# Patient Record
Sex: Female | Born: 1955
Health system: Southern US, Community
[De-identification: ages and names within clinical notes are randomized; demographics above are authoritative.]

## PROBLEM LIST (undated history)

## (undated) DIAGNOSIS — Z86010 Personal history of colonic polyps: Secondary | ICD-10-CM

## (undated) DIAGNOSIS — I10 Essential (primary) hypertension: Secondary | ICD-10-CM

## (undated) DIAGNOSIS — E538 Deficiency of other specified B group vitamins: Secondary | ICD-10-CM

## (undated) DIAGNOSIS — M545 Low back pain, unspecified: Secondary | ICD-10-CM

## (undated) DIAGNOSIS — M1990 Unspecified osteoarthritis, unspecified site: Secondary | ICD-10-CM

## (undated) DIAGNOSIS — I2510 Atherosclerotic heart disease of native coronary artery without angina pectoris: Secondary | ICD-10-CM

## (undated) DIAGNOSIS — D649 Anemia, unspecified: Secondary | ICD-10-CM

## (undated) DIAGNOSIS — H269 Unspecified cataract: Secondary | ICD-10-CM

## (undated) DIAGNOSIS — T7840XA Allergy, unspecified, initial encounter: Secondary | ICD-10-CM

## (undated) DIAGNOSIS — E785 Hyperlipidemia, unspecified: Secondary | ICD-10-CM

## (undated) HISTORY — DX: Anemia, unspecified: D64.9

## (undated) HISTORY — DX: Atherosclerotic heart disease of native coronary artery without angina pectoris: I25.10

## (undated) HISTORY — DX: Unspecified cataract: H26.9

## (undated) HISTORY — DX: Personal history of colonic polyps: Z86.010

## (undated) HISTORY — DX: Essential (primary) hypertension: I10

## (undated) HISTORY — PX: TUBAL LIGATION: SHX77

## (undated) HISTORY — PX: LASIK: SHX215

## (undated) HISTORY — DX: Unspecified osteoarthritis, unspecified site: M19.90

## (undated) HISTORY — PX: OTHER SURGICAL HISTORY: SHX169

## (undated) HISTORY — DX: Hyperlipidemia, unspecified: E78.5

## (undated) HISTORY — DX: Low back pain, unspecified: M54.50

## (undated) HISTORY — PX: POLYPECTOMY: SHX149

## (undated) HISTORY — DX: Low back pain: M54.5

## (undated) HISTORY — PX: BREAST LUMPECTOMY: SHX2

## (undated) HISTORY — DX: Allergy, unspecified, initial encounter: T78.40XA

## (undated) HISTORY — PX: EYE SURGERY: SHX253

## (undated) HISTORY — PX: COLONOSCOPY: SHX174

---
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
## 1997-10-18 ENCOUNTER — Ambulatory Visit (HOSPITAL_COMMUNITY): Admission: RE | Admit: 1997-10-18 | Discharge: 1997-10-18 | Payer: Self-pay | Admitting: *Deleted

## 1998-04-14 HISTORY — PX: OTHER SURGICAL HISTORY: SHX169

## 1998-06-09 ENCOUNTER — Encounter: Payer: Self-pay | Admitting: Emergency Medicine

## 1998-06-09 ENCOUNTER — Inpatient Hospital Stay (HOSPITAL_COMMUNITY): Admission: EM | Admit: 1998-06-09 | Discharge: 1998-06-14 | Payer: Self-pay | Admitting: Emergency Medicine

## 1998-06-10 ENCOUNTER — Encounter: Payer: Self-pay | Admitting: Internal Medicine

## 1998-11-06 ENCOUNTER — Other Ambulatory Visit: Admission: RE | Admit: 1998-11-06 | Discharge: 1998-11-06 | Payer: Self-pay | Admitting: Internal Medicine

## 1999-04-15 HISTORY — PX: OTHER SURGICAL HISTORY: SHX169

## 1999-10-07 ENCOUNTER — Other Ambulatory Visit: Admission: RE | Admit: 1999-10-07 | Discharge: 1999-10-07 | Payer: Self-pay | Admitting: Gynecology

## 1999-10-21 ENCOUNTER — Other Ambulatory Visit: Admission: RE | Admit: 1999-10-21 | Discharge: 1999-10-21 | Payer: Self-pay | Admitting: Gynecology

## 1999-12-02 ENCOUNTER — Encounter (INDEPENDENT_AMBULATORY_CARE_PROVIDER_SITE_OTHER): Payer: Self-pay | Admitting: Specialist

## 1999-12-02 ENCOUNTER — Other Ambulatory Visit: Admission: RE | Admit: 1999-12-02 | Discharge: 1999-12-02 | Payer: Self-pay | Admitting: Gynecology

## 2000-10-16 ENCOUNTER — Other Ambulatory Visit: Admission: RE | Admit: 2000-10-16 | Discharge: 2000-10-16 | Payer: Self-pay | Admitting: Obstetrics and Gynecology

## 2004-02-29 ENCOUNTER — Ambulatory Visit: Payer: Self-pay | Admitting: Internal Medicine

## 2004-03-06 ENCOUNTER — Ambulatory Visit: Payer: Self-pay | Admitting: Internal Medicine

## 2004-05-06 ENCOUNTER — Ambulatory Visit: Payer: Self-pay | Admitting: Internal Medicine

## 2004-07-04 ENCOUNTER — Ambulatory Visit: Payer: Self-pay | Admitting: Internal Medicine

## 2004-09-06 ENCOUNTER — Ambulatory Visit: Payer: Self-pay | Admitting: Internal Medicine

## 2004-09-16 ENCOUNTER — Ambulatory Visit: Payer: Self-pay | Admitting: Internal Medicine

## 2004-12-17 ENCOUNTER — Ambulatory Visit: Payer: Self-pay | Admitting: Internal Medicine

## 2005-03-24 ENCOUNTER — Ambulatory Visit: Payer: Self-pay | Admitting: Internal Medicine

## 2005-06-24 ENCOUNTER — Ambulatory Visit: Payer: Self-pay | Admitting: Internal Medicine

## 2005-08-25 ENCOUNTER — Ambulatory Visit: Payer: Self-pay | Admitting: Internal Medicine

## 2005-09-25 ENCOUNTER — Other Ambulatory Visit: Admission: RE | Admit: 2005-09-25 | Discharge: 2005-09-25 | Payer: Self-pay | Admitting: Gynecology

## 2005-10-27 ENCOUNTER — Ambulatory Visit: Payer: Self-pay | Admitting: Internal Medicine

## 2005-11-12 ENCOUNTER — Ambulatory Visit: Payer: Self-pay | Admitting: Internal Medicine

## 2005-12-18 ENCOUNTER — Encounter: Payer: Self-pay | Admitting: Internal Medicine

## 2005-12-18 ENCOUNTER — Encounter (INDEPENDENT_AMBULATORY_CARE_PROVIDER_SITE_OTHER): Payer: Self-pay | Admitting: *Deleted

## 2005-12-18 ENCOUNTER — Ambulatory Visit: Payer: Self-pay | Admitting: Internal Medicine

## 2005-12-18 DIAGNOSIS — Z860100 Personal history of colon polyps, unspecified: Secondary | ICD-10-CM | POA: Insufficient documentation

## 2005-12-25 LAB — HM COLONOSCOPY

## 2006-01-20 ENCOUNTER — Ambulatory Visit: Payer: Self-pay | Admitting: Internal Medicine

## 2006-01-20 LAB — CONVERTED CEMR LAB
ALT: 16 units/L (ref 0–40)
AST: 21 units/L (ref 0–37)
Albumin: 3.4 g/dL — ABNORMAL LOW (ref 3.5–5.2)
Alkaline Phosphatase: 54 units/L (ref 39–117)
Bilirubin, Direct: 0.1 mg/dL (ref 0.0–0.3)
Chol/HDL Ratio, serum: 5.5
Cholesterol: 174 mg/dL (ref 0–200)
HDL: 31.7 mg/dL — ABNORMAL LOW (ref >39.0)
LDL Cholesterol: 120 mg/dL — ABNORMAL HIGH (ref 0–99)
Total Bilirubin: 0.4 mg/dL (ref 0.3–1.2)
Total Protein: 6.1 g/dL (ref 6.0–8.3)
Triglyceride fasting, serum: 111 mg/dL (ref 0–149)
VLDL: 22 mg/dL (ref 0–40)

## 2006-01-27 ENCOUNTER — Ambulatory Visit: Payer: Self-pay | Admitting: Internal Medicine

## 2006-03-31 ENCOUNTER — Ambulatory Visit: Payer: Self-pay | Admitting: Internal Medicine

## 2006-05-27 ENCOUNTER — Ambulatory Visit: Payer: Self-pay | Admitting: Internal Medicine

## 2006-07-13 ENCOUNTER — Ambulatory Visit: Payer: Self-pay | Admitting: Internal Medicine

## 2006-09-18 ENCOUNTER — Ambulatory Visit: Payer: Self-pay | Admitting: Internal Medicine

## 2006-09-18 LAB — CONVERTED CEMR LAB: Hgb A1c MFr Bld: 6.8 % — ABNORMAL HIGH (ref 4.6–6.0)

## 2006-11-09 DIAGNOSIS — I2510 Atherosclerotic heart disease of native coronary artery without angina pectoris: Secondary | ICD-10-CM | POA: Insufficient documentation

## 2006-11-19 ENCOUNTER — Ambulatory Visit: Payer: Self-pay | Admitting: Internal Medicine

## 2006-11-19 DIAGNOSIS — I152 Hypertension secondary to endocrine disorders: Secondary | ICD-10-CM | POA: Insufficient documentation

## 2006-11-19 DIAGNOSIS — M545 Low back pain, unspecified: Secondary | ICD-10-CM | POA: Insufficient documentation

## 2006-11-19 DIAGNOSIS — E1165 Type 2 diabetes mellitus with hyperglycemia: Secondary | ICD-10-CM

## 2006-11-19 DIAGNOSIS — I10 Essential (primary) hypertension: Secondary | ICD-10-CM | POA: Insufficient documentation

## 2006-11-19 DIAGNOSIS — E785 Hyperlipidemia, unspecified: Secondary | ICD-10-CM

## 2007-01-26 ENCOUNTER — Telehealth: Payer: Self-pay | Admitting: Internal Medicine

## 2007-02-24 ENCOUNTER — Telehealth: Payer: Self-pay | Admitting: *Deleted

## 2007-02-26 ENCOUNTER — Ambulatory Visit: Payer: Self-pay | Admitting: Internal Medicine

## 2007-02-26 LAB — CONVERTED CEMR LAB: Hgb A1c MFr Bld: 6.9 % — ABNORMAL HIGH (ref 4.6–6.0)

## 2007-04-02 ENCOUNTER — Telehealth: Payer: Self-pay | Admitting: Internal Medicine

## 2007-05-03 ENCOUNTER — Telehealth: Payer: Self-pay | Admitting: Internal Medicine

## 2007-05-07 ENCOUNTER — Ambulatory Visit: Payer: Self-pay | Admitting: Internal Medicine

## 2007-05-07 DIAGNOSIS — M1990 Unspecified osteoarthritis, unspecified site: Secondary | ICD-10-CM | POA: Insufficient documentation

## 2007-06-03 ENCOUNTER — Encounter: Payer: Self-pay | Admitting: Internal Medicine

## 2007-07-07 ENCOUNTER — Encounter: Payer: Self-pay | Admitting: Internal Medicine

## 2007-07-13 ENCOUNTER — Ambulatory Visit (HOSPITAL_COMMUNITY): Admission: RE | Admit: 2007-07-13 | Discharge: 2007-07-14 | Payer: Self-pay | Admitting: Orthopedic Surgery

## 2007-07-13 ENCOUNTER — Encounter: Payer: Self-pay | Admitting: Internal Medicine

## 2007-07-20 ENCOUNTER — Encounter: Payer: Self-pay | Admitting: Internal Medicine

## 2007-08-24 ENCOUNTER — Telehealth: Payer: Self-pay | Admitting: Internal Medicine

## 2007-09-27 ENCOUNTER — Ambulatory Visit: Payer: Self-pay | Admitting: Internal Medicine

## 2007-09-27 LAB — CONVERTED CEMR LAB
ALT: 20 units/L (ref 0–35)
AST: 22 units/L (ref 0–37)
Alkaline Phosphatase: 57 units/L (ref 39–117)
Total Bilirubin: 0.6 mg/dL (ref 0.3–1.2)
Total CHOL/HDL Ratio: 5.1
Triglycerides: 163 mg/dL — ABNORMAL HIGH (ref 0–149)

## 2007-10-04 ENCOUNTER — Ambulatory Visit: Payer: Self-pay | Admitting: Internal Medicine

## 2007-11-29 ENCOUNTER — Ambulatory Visit: Payer: Self-pay | Admitting: Internal Medicine

## 2007-12-06 ENCOUNTER — Ambulatory Visit: Payer: Self-pay | Admitting: Internal Medicine

## 2007-12-29 ENCOUNTER — Telehealth: Payer: Self-pay | Admitting: Internal Medicine

## 2008-01-06 ENCOUNTER — Telehealth: Payer: Self-pay | Admitting: Internal Medicine

## 2008-04-17 ENCOUNTER — Ambulatory Visit: Payer: Self-pay | Admitting: Internal Medicine

## 2008-04-24 ENCOUNTER — Ambulatory Visit: Payer: Self-pay | Admitting: Internal Medicine

## 2008-06-27 ENCOUNTER — Telehealth: Payer: Self-pay | Admitting: Internal Medicine

## 2008-07-24 ENCOUNTER — Ambulatory Visit: Payer: Self-pay | Admitting: Internal Medicine

## 2008-08-14 ENCOUNTER — Ambulatory Visit: Payer: Self-pay | Admitting: Internal Medicine

## 2008-08-14 DIAGNOSIS — B029 Zoster without complications: Secondary | ICD-10-CM | POA: Insufficient documentation

## 2008-08-23 ENCOUNTER — Telehealth (INDEPENDENT_AMBULATORY_CARE_PROVIDER_SITE_OTHER): Payer: Self-pay | Admitting: *Deleted

## 2008-09-19 ENCOUNTER — Ambulatory Visit: Payer: Self-pay | Admitting: Internal Medicine

## 2008-09-19 DIAGNOSIS — L255 Unspecified contact dermatitis due to plants, except food: Secondary | ICD-10-CM | POA: Insufficient documentation

## 2008-09-19 LAB — CONVERTED CEMR LAB
Hgb A1c MFr Bld: 6.9 % — ABNORMAL HIGH (ref 4.6–6.5)
Microalb Creat Ratio: 3.8 mg/g (ref 0.0–30.0)
Microalb, Ur: 0.7 mg/dL (ref 0.0–1.9)

## 2008-12-12 ENCOUNTER — Encounter: Payer: Self-pay | Admitting: Internal Medicine

## 2008-12-12 ENCOUNTER — Encounter (INDEPENDENT_AMBULATORY_CARE_PROVIDER_SITE_OTHER): Payer: Self-pay | Admitting: *Deleted

## 2008-12-13 LAB — HM DIABETES EYE EXAM

## 2009-01-05 ENCOUNTER — Ambulatory Visit: Payer: Self-pay | Admitting: Internal Medicine

## 2009-04-11 ENCOUNTER — Ambulatory Visit: Payer: Self-pay | Admitting: Internal Medicine

## 2009-04-11 LAB — CONVERTED CEMR LAB
ALT: 21 units/L (ref 0–35)
Calcium: 9.3 mg/dL (ref 8.4–10.5)
Cholesterol: 162 mg/dL (ref 0–200)
Creatinine, Ser: 1 mg/dL (ref 0.4–1.2)
Eosinophils Relative: 4.7 % (ref 0.0–5.0)
GFR calc non Af Amer: 61.01 mL/min (ref >60)
Glucose, Urine, Semiquant: NEGATIVE
HCT: 37.8 % (ref 36.0–46.0)
Hgb A1c MFr Bld: 7.2 % — ABNORMAL HIGH (ref 4.6–6.5)
Lymphs Abs: 1.5 10*3/uL (ref 0.7–4.0)
MCV: 98.1 fL (ref 78.0–100.0)
Microalb Creat Ratio: 4.5 mg/g (ref 0.0–30.0)
Monocytes Absolute: 0.3 10*3/uL (ref 0.1–1.0)
Platelets: 218 10*3/uL (ref 150.0–400.0)
RDW: 13.1 % (ref 11.5–14.6)
Specific Gravity, Urine: 1.025
TSH: 2.88 microintl units/mL (ref 0.35–5.50)
Total Bilirubin: 0.7 mg/dL (ref 0.3–1.2)
Triglycerides: 211 mg/dL — ABNORMAL HIGH (ref 0.0–149.0)
WBC Urine, dipstick: NEGATIVE
WBC: 6.6 10*3/uL (ref 4.5–10.5)
pH: 5

## 2009-04-27 ENCOUNTER — Ambulatory Visit: Payer: Self-pay | Admitting: Internal Medicine

## 2009-04-27 DIAGNOSIS — L218 Other seborrheic dermatitis: Secondary | ICD-10-CM | POA: Insufficient documentation

## 2009-07-30 ENCOUNTER — Ambulatory Visit: Payer: Self-pay | Admitting: Internal Medicine

## 2009-08-08 ENCOUNTER — Ambulatory Visit: Payer: Self-pay | Admitting: Internal Medicine

## 2009-08-19 ENCOUNTER — Observation Stay (HOSPITAL_COMMUNITY): Admission: EM | Admit: 2009-08-19 | Discharge: 2009-08-20 | Payer: Self-pay | Admitting: Emergency Medicine

## 2009-08-20 ENCOUNTER — Ambulatory Visit: Payer: Self-pay | Admitting: Vascular Surgery

## 2009-08-24 ENCOUNTER — Encounter: Payer: Self-pay | Admitting: Internal Medicine

## 2009-09-12 ENCOUNTER — Telehealth (INDEPENDENT_AMBULATORY_CARE_PROVIDER_SITE_OTHER): Payer: Self-pay | Admitting: *Deleted

## 2009-09-12 ENCOUNTER — Ambulatory Visit: Payer: Self-pay | Admitting: Women's Health

## 2009-09-12 ENCOUNTER — Other Ambulatory Visit: Admission: RE | Admit: 2009-09-12 | Discharge: 2009-09-12 | Payer: Self-pay | Admitting: Gynecology

## 2009-09-14 ENCOUNTER — Ambulatory Visit: Payer: Self-pay | Admitting: Vascular Surgery

## 2009-09-14 ENCOUNTER — Encounter: Payer: Self-pay | Admitting: Internal Medicine

## 2009-11-14 ENCOUNTER — Ambulatory Visit: Payer: Self-pay | Admitting: Internal Medicine

## 2009-11-14 LAB — CONVERTED CEMR LAB: Hgb A1c MFr Bld: 7.4 % — ABNORMAL HIGH (ref 4.6–6.5)

## 2009-11-28 ENCOUNTER — Ambulatory Visit: Payer: Self-pay | Admitting: Women's Health

## 2009-12-11 ENCOUNTER — Ambulatory Visit: Payer: Self-pay | Admitting: Internal Medicine

## 2010-02-27 ENCOUNTER — Ambulatory Visit: Payer: Self-pay | Admitting: Internal Medicine

## 2010-03-01 ENCOUNTER — Telehealth: Payer: Self-pay | Admitting: *Deleted

## 2010-03-01 ENCOUNTER — Ambulatory Visit: Payer: Self-pay | Admitting: Family Medicine

## 2010-03-01 DIAGNOSIS — R42 Dizziness and giddiness: Secondary | ICD-10-CM | POA: Insufficient documentation

## 2010-03-01 LAB — CONVERTED CEMR LAB
ALT: 13 units/L (ref 0–35)
AST: 17 units/L (ref 0–37)
Alkaline Phosphatase: 53 units/L (ref 39–117)
Bilirubin, Direct: 0.1 mg/dL (ref 0.0–0.3)
CO2: 26 meq/L (ref 19–32)
Chloride: 102 meq/L (ref 96–112)
Sodium: 139 meq/L (ref 135–145)
Total Protein: 7.1 g/dL (ref 6.0–8.3)

## 2010-03-06 ENCOUNTER — Ambulatory Visit: Payer: Self-pay | Admitting: Internal Medicine

## 2010-03-26 ENCOUNTER — Ambulatory Visit: Payer: Self-pay | Admitting: Vascular Surgery

## 2010-05-14 NOTE — Progress Notes (Signed)
  Phone Note Other Incoming   Summary of Call: Please call her and let her know her labs were all good, but her HbA1c (long term diabetes test) was a little higher than last time (was 7.4%, now is 7.7%).  I'll let her and Dr. Lovell Sheehan discuss a plan for this at her scheduled f/u visit with him next week. Initial call taken by: Michell Heinrich M.D.,  March 01, 2010 4:56 PM  Follow-up for Phone Call        Pt aware of results. Pt is going to see Dr. Lovell Sheehan on 11/23. Follow-up by: Romualdo Bolk, CMA (AAMA),  March 04, 2010 10:21 AM

## 2010-05-14 NOTE — Progress Notes (Signed)
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    Initial call taken by: Debbra Riding,  September 12, 2009 2:25 PM  Follow-up for Phone Call        may refill Follow-up by: Stacie Glaze MD,  September 13, 2009 8:08 AM    Prescriptions: GLUCOVANCE 5-500 MG  TABS [facility]) three times a day  #90 x 6   Entered by:   Willy Eddy, LPN   Authorized by:   Stacie Glaze MD   Signed by:   Willy Eddy, LPN on 75/64/3329   Method used:   [facility]        [facility] [address] [phone]* (retail)       [address]       [place], [place]  [address]       Ph: [phone] or [phone]       Fax: [phone]   RxID:   [id number]   Appended Document: REFILL REQUEST (Metformin) left message on machine to pharmacy to disregard -sent to wrong pharmacy

## 2010-05-14 NOTE — Assessment & Plan Note (Signed)
Summary: dizziness/njr   Vital Signs:  Patient profile:   55 year old female Weight:      167 pounds O2 Sat:      98 % on Room air Temp:     97.8 degrees F oral Pulse rate:   74 / minute BP sitting:   110 / 60  (left arm) Cuff size:   regular  Vitals Entered By: Romualdo Bolk, CMA (AAMA) (March 01, 2010 1:25 PM)  O2 Flow:  Room air CC: Dizziness and some nausea when she bends over and gets back up. 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   CC:  Dizziness and some nausea when she bends over and gets back up. 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 Describes onset about 2-3 days ago of light headed feeling that occurs only with position changes of her head and upper body, lasts only a few seconds.  Denies vertigo sensation--just says she feels lightheaded.  Denies ringing in ears or hearing problems.  Had some mild nausea at onset of problem but none since.  Has had mild nasal congestion last few days, without coughing or fevers.  Glucoses have been unchanged from her normal lately--says they are never far off of normal on daily or every other day checks.   ROS: No headaches, no GI or GU symptoms, no rash, no muscle aches or joint aches.   Preventive Screening-Counseling & Management  Alcohol-Tobacco     Smoking Status: quit     Smoke Cessation Stage: contemplative     Year Quit: 2010     Pack years: 20     Tobacco Counseling: to quit use of tobacco products  Caffeine-Diet-Exercise     Diet Comments: obese     Does Patient Exercise: no     Depression Counseling: no aplicable  Current Problems (verified): 1)  Dizziness  (ICD-780.4) 2)  Other Seborrheic Dermatitis  (ICD-690.18) 3)  Preventive Health Care  (ICD-V70.0) 4)  Shingles  (ICD-053.9) 5)  Contact Dermatitis Due To Poison Ivy  (ICD-692.6) 6)  Monilial Vaginitis  (ICD-112.1) 7)  Viral Exanthem, Acute   (ICD-057.9) 8)  Uns Advrs Eff Uns Rx Medicinal&biological Sbstnc  (ICD-995.20) 9)  Osteoarthritis  (ICD-715.90) 10)  Family History of Cad Female 1st Degree Relative <60  (ICD-V16.49) 11)  Family History of Cad Female 1st Degree Relative <50  (ICD-V17.3) 12)  Low Back Pain  (ICD-724.2) 13)  Hyperlipidemia  (ICD-272.4) 14)  Hypertension  (ICD-401.9) 15)  Diabetes Mellitus, Type II  (ICD-250.00) 16)  Coronary Artery Disease  (ICD-414.00) 17)  Anemia-nos  (ICD-285.9)  Current Medications (verified): 1)  Baby Aspirin 81 Mg  Chew (Aspirin) .... Once Daily 2)  Glucovance 5-500 Mg  Tabs (Glyburide-Metformin) .... Three Times A Day 3)  Lopressor 50 Mg  Tabs (Metoprolol Tartrate) .... 1/2 Twice A Day 4)  Crestor 10 Mg  Tabs (Rosuvastatin Calcium) .... Once Daily 5)  Allegra-D 24 Hour 180-240 Mg  Tb24 (Fexofenadine-Pseudoephedrine) .... As Needed 6)  Osteo Complex   Caps (Multiple Vitamins-Minerals) .... Once Daily 7)  Benicar Hct 20-12.5 Mg  Tabs (Olmesartan Medoxomil-Hctz) .... Once Daily 8)  Onglyza 5 Mg Tabs (Saxagliptin Hcl) .... One By Mouth Daily 9)  Sarna Sensitive 1 % Lotn (Pramoxine Hcl) 10)  Accu-Chek Compact Test Drum  Strp (Glucose Blood) .[name].. 1 Three Times A Day  Allergies (verified):  No Known Drug Allergies  Past History:  Past medical, surgical, family and social histories (including risk factors) reviewed for relevance to current acute and chronic problems.  Past Medical History: Reviewed history from 05/07/2007 and no changes required. Diabetes mellitus, type II Hypertension Low back pain Hyperlipidemia Coronary artery disease Osteoarthritis  Past Surgical History: Reviewed history from 04/24/2008 and no changes required. Caesarean section knee surgery for torn cartilige Carpal tunnel release both hands Lumpectomy stents 2001 Rotator cuff surgery triger finger repair  Family History: Reviewed history from 11/19/2006 and no changes required. father Family  History of CAD Female 1st degree relative <50 mother Family History of CAD Female 1st degree relative <60 Family History Hypertension  Social History: Reviewed history from 11/19/2006 and no changes required. Married Former Smoker Alcohol use-no Drug use-no Regular exercise-no  Review of Systems       see HPI  Physical Exam  General:  VS: noted, all normal. Gen: Alert, well appearing, oriented x 4. HEENT: Scalp without lesions or hair loss.  Ears: EACs clear, normal epithelium.  TMs with good light reflex and landmarks bilaterally.  Eyes: no injection, icteris, swelling, or exudate.  EOMI, PERRLA. Nose: no drainage or turbinate edema/swelling.  No inection or focal lesion.  Mouth: lips without lesion/swelling.  Oral mucosa pink and moist.  Dentition intact and without obvious caries or gingival swelling.  Oropharynx without erythema, exudate, or swelling.  Neck: supple.  No lymphadenopathy, thyromegaly, or mass. Chest: symmetric expansion, with nonlabored respirations.  Clear and equal breath sounds in all lung fields.   CV: RRR, no m/r/g.  Peripheral pulses 2+/symmetric. Neuro: CN 2-12 intact bilaterally, strength 5/5 in proximal and distal upper extremities and lower extremities bilaterally.  No sensory deficits.  No tremor.  No disdiadokinesis.  No ataxia.  Upper extremity and lower extremity DTRs symmetric.  No pronator drift. Dix-Halpike maneuvers do induce 3-4 seconds of lightheaded feeling but no nystagmus and no vertigo or nausea.   Impression & Recommendations:  Problem # 1:  DIZZINESS (ICD-780.4) She likely has atypical BPPV.   I reviewed her w/u during a recent hospitalization for severe vertigo a few months ago. I gave a handout of modified Epley maneuvers for BPPV and went over these with her---she'll try these several times per day over the next week.  She has a routine f/u visit already scheduled with Dr. Lovell Sheehan next week.  Orders: TLB-BMP (Basic Metabolic  Panel-BMET) (80048-METABOL) TLB-Hepatic/Liver Function Pnl (80076-HEPATIC) TLB-A1C / Hgb A1C (Glycohemoglobin) (83036-A1C)  Problem # 2:  HYPERTENSION (ICD-401.9) Stable.  Her updated medication list for this problem includes:    Lopressor 50 Mg Tabs (Metoprolol tartrate) .[name]... 1/2 twice a day    Benicar Hct 20-12.5 Mg Tabs (Olmesartan medoxomil-hctz) ..... Once daily  Orders: TLB-BMP (Basic Metabolic Panel-BMET) (80048-METABOL) TLB-Hepatic/Liver Function Pnl (80076-HEPATIC) TLB-A1C / Hgb A1C (Glycohemoglobin) (83036-A1C)  Problem # 3:  DIABETES MELLITUS, TYPE II (ICD-250.00) Stable.  Labs today.  Her updated medication list for this problem includes:    Baby Aspirin 81 Mg Chew (Aspirin) ..... Once daily    Glucovance 5-500 Mg Tabs (Glyburide-metformin) .[name]... Three times a day    Benicar Hct 20-12.5 Mg Tabs (Olmesartan medoxomil-hctz) ..... Once daily    Onglyza 5 Mg Tabs (Saxagliptin hcl) ..... One by mouth daily  Orders: TLB-BMP (Basic Metabolic Panel-BMET) (80048-METABOL) TLB-Hepatic/Liver Function Pnl (80076-HEPATIC) TLB-A1C / Hgb A1C (Glycohemoglobin) (83036-A1C)  Complete Medication List: 1)  Baby Aspirin 81 Mg Chew (Aspirin) .... Once daily 2)  Glucovance 5-500  Mg Tabs (Glyburide-metformin) .... Three times a day 3)  Lopressor 50 Mg Tabs (Metoprolol tartrate) .... 1/2 twice a day 4)  Crestor 10 Mg Tabs (Rosuvastatin calcium) .... Once daily 5)  Allegra-d 24 Hour 180-240 Mg Tb24 (Fexofenadine-pseudoephedrine) .... As needed 6)  Osteo Complex Caps (Multiple vitamins-minerals) .... Once daily 7)  Benicar Hct 20-12.5 Mg Tabs (Olmesartan medoxomil-hctz) .... Once daily 8)  Onglyza 5 Mg Tabs (Saxagliptin hcl) .... One by mouth daily 9)  Sarna Sensitive 1 % Lotn (Pramoxine hcl) 10)  Accu-chek Compact Test Drum Strp (Glucose blood) .[name].. 1 three times a day  Patient Instructions: 1)  Keep your follow up appointment next week with Dr. Lovell Sheehan. 2)  Do the positioning exercises  on your handout.   Orders Added: 1)  Est. Patient Level IV [[address]] 2)  TLB-BMP (Basic Metabolic Panel-BMET) [80048-METABOL] 3)  TLB-Hepatic/Liver Function Pnl [80076-HEPATIC] 4)  TLB-A1C / Hgb A1C (Glycohemoglobin) [83036-A1C]  Appended Document: dizziness/njr    Clinical Lists Changes  Observations: Added new observation of PULSE STAND: 72 /min (03/01/2010 15:15) Added new observation of BP DIA STAND: 80 mm Hg (03/01/2010 15:15) Added new observation of BP SYS STAND: 124 mm Hg (03/01/2010 15:15) Added new observation of PULSE SIT: 66 /min (03/01/2010 15:15) Added new observation of BP DIA SIT: 80 mm Hg (03/01/2010 15:15) Added new observation of BP SYS SIT: 134 mm Hg (03/01/2010 15:15) Added new observation of PULSE SUP R: 72 /min (03/01/2010 15:15) Added new observation of BP DIA SUP R: 80  (03/01/2010 15:15) Added new observation of BP SYS SUP R: 130  (03/01/2010 15:15)        Vital Signs:  Patient profile:   55 year old female Pulse (ortho):   72 / minute BP standing:   124 / 80   Serial Vital Signs/Assessments:  Time      Position  BP       Pulse  Resp  Temp     By           Lying RA  130/80   72                    Shannon S Cranford, CMA (AAMA)           Sitting   134/80   66                    Romualdo Bolk, CMA (AAMA)           Standing  124/80   72                    Shannon S Cranford, CMA (AAMA)

## 2010-05-14 NOTE — Assessment & Plan Note (Signed)
Summary: 3 month rov/njr   Vital Signs:  Patient profile:   55 year old female Height:      63 inches Weight:      167 pounds BMI:     29.69 Temp:     98.2 degrees F oral Pulse rate:   72 / minute Resp:     14 per minute BP sitting:   134 / 70  (left arm)  Vitals Entered By: Willy Eddy, LPN (March 06, 2010 9:56 AM) CC: roa labs, Hypertension Management Is Patient Diabetic? Yes Did you bring your meter with you today? No   Primary Care Provider:  Stacie Glaze MD  CC:  roa labs and Hypertension Management.  History of Present Illness: was seen last friday for recurrent labrynthitis, has tied the eply manuvers and the dizzyness has resolved woried about hypoglycemia still smoking and we discussed the risks of smoking with her today  The pts A1C is 7.7 with a goal of less that seven exercize is lackng and diet consistant she works third shift... which is difficult bloodpresure is stable  Hypertension History:      She denies headache, chest pain, palpitations, dyspnea with exertion, orthopnea, PND, peripheral edema, visual symptoms, neurologic problems, syncope, and side effects from treatment.        Positive major cardiovascular risk factors include female age 3 years old or older, diabetes, hyperlipidemia, and hypertension.  Negative major cardiovascular risk factors include non-tobacco-user status.        Positive history for target organ damage include ASHD (either angina/prior MI/prior CABG).     Preventive Screening-Counseling & Management  Alcohol-Tobacco     Smoking Status: quit     Smoke Cessation Stage: contemplative     Year Quit: 2010     Pack years: 20     Tobacco Counseling: to quit use of tobacco products  Problems Prior to Update: 1)  Dizziness  (ICD-780.4) 2)  Other Seborrheic Dermatitis  (ICD-690.18) 3)  Preventive Health Care  (ICD-V70.0) 4)  Shingles  (ICD-053.9) 5)  Contact Dermatitis Due To Poison Ivy  (ICD-692.6) 6)  Monilial  Vaginitis  (ICD-112.1) 7)  Viral Exanthem, Acute  (ICD-057.9) 8)  Uns Advrs Eff Uns Rx Medicinal&biological Sbstnc  (ICD-995.20) 9)  Osteoarthritis  (ICD-715.90) 10)  Family History of Cad Female 1st Degree Relative <60  (ICD-V16.49) 11)  Family History of Cad Female 1st Degree Relative <50  (ICD-V17.3) 12)  Low Back Pain  (ICD-724.2) 13)  Hyperlipidemia  (ICD-272.4) 14)  Hypertension  (ICD-401.9) 15)  Diabetes Mellitus, Type II  (ICD-250.00) 16)  Coronary Artery Disease  (ICD-414.00) 17)  Anemia-nos  (ICD-285.9)  Current Problems (verified): 1)  Dizziness  (ICD-780.4) 2)  Other Seborrheic Dermatitis  (ICD-690.18) 3)  Preventive Health Care  (ICD-V70.0) 4)  Shingles  (ICD-053.9) 5)  Contact Dermatitis Due To Poison Ivy  (ICD-692.6) 6)  Monilial Vaginitis  (ICD-112.1) 7)  Viral Exanthem, Acute  (ICD-057.9) 8)  Uns Advrs Eff Uns Rx Medicinal&biological Sbstnc  (ICD-995.20) 9)  Osteoarthritis  (ICD-715.90) 10)  Family History of Cad Female 1st Degree Relative <60  (ICD-V16.49) 11)  Family History of Cad Female 1st Degree Relative <50  (ICD-V17.3) 12)  Low Back Pain  (ICD-724.2) 13)  Hyperlipidemia  (ICD-272.4) 14)  Hypertension  (ICD-401.9) 15)  Diabetes Mellitus, Type II  (ICD-250.00) 16)  Coronary Artery Disease  (ICD-414.00) 17)  Anemia-nos  (ICD-285.9)  Medications Prior to Update: 1)  Baby Aspirin 81 Mg  Chew (Aspirin) .... Once  Daily 2)  Glucovance 5-500 Mg  Tabs (Glyburide-Metformin) .... Three Times A Day 3)  Lopressor 50 Mg  Tabs (Metoprolol Tartrate) .... 1/2 Twice A Day 4)  Crestor 10 Mg  Tabs (Rosuvastatin Calcium) .... Once Daily 5)  Allegra-D 24 Hour 180-240 Mg  Tb24 (Fexofenadine-Pseudoephedrine) .... As Needed 6)  Osteo Complex   Caps (Multiple Vitamins-Minerals) .... Once Daily 7)  Benicar Hct 20-12.5 Mg  Tabs (Olmesartan Medoxomil-Hctz) .... Once Daily 8)  Onglyza 5 Mg Tabs (Saxagliptin Hcl) .... One By Mouth Daily 9)  Sarna Sensitive 1 % Lotn (Pramoxine Hcl) 10)   Accu-Chek Compact Test Drum  Strp (Glucose Blood) .[name].. 1 Three Times A Day  Current Medications (verified): 1)  Baby Aspirin 81 Mg  Chew (Aspirin) .... Once Daily 2)  Metformin Hcl 1000 Mg Tabs (Metformin Hcl) .... One By Mouth Two Times A Day 6 Pm and 4 Am On Work Days and 6 Pm and in The Am When You Awaken ( 7-8am) On Non Work Days 3)  Lopressor 50 Mg  Tabs (Metoprolol Tartrate) .... 1/2 Twice A Day 4)  Crestor 10 Mg  Tabs (Rosuvastatin Calcium) .... Once Daily 5)  Allegra-D 24 Hour 180-240 Mg  Tb24 (Fexofenadine-Pseudoephedrine) .... As Needed 6)  Osteo Complex   Caps (Multiple Vitamins-Minerals) .... Once Daily 7)  Benicar Hct 20-12.5 Mg  Tabs (Olmesartan Medoxomil-Hctz) .... Once Daily 8)  Onglyza 5 Mg Tabs (Saxagliptin Hcl) .... One By Mouth Daily 9)  Sarna Sensitive 1 % Lotn (Pramoxine Hcl) 10)  Accu-Chek Compact Test Drum  Strp (Glucose Blood) .[name].. 1 Three Times A Day 11)  Glimepiride 4 Mg Tabs (Glimepiride) .... One By Mouth At 6 Pm  Allergies (verified): No Known Drug Allergies  Past History:  Family History: Last updated: 11/19/2006 father Family History of CAD Female 1st degree relative <50 mother Family History of CAD Female 1st degree relative <60 Family History Hypertension  Social History: Last updated: 11/19/2006 Married Former Smoker Alcohol use-no Drug use-no Regular exercise-no  Risk Factors: Diet: obese (03/01/2010) Exercise: no (03/01/2010)  Risk Factors: Smoking Status: quit (03/06/2010)  Past medical, surgical, family and social histories (including risk factors) reviewed, and no changes noted (except as noted below).  Past Medical History: Reviewed history from 05/07/2007 and no changes required. Diabetes mellitus, type II Hypertension Low back pain Hyperlipidemia Coronary artery disease Osteoarthritis  Past Surgical History: Reviewed history from 04/24/2008 and no changes required. Caesarean section knee surgery for torn  cartilige Carpal tunnel release both hands Lumpectomy stents 2001 Rotator cuff surgery triger finger repair  Family History: Reviewed history from 11/19/2006 and no changes required. father Family History of CAD Female 1st degree relative <50 mother Family History of CAD Female 1st degree relative <60 Family History Hypertension  Social History: Reviewed history from 11/19/2006 and no changes required. Married Former Smoker Alcohol use-no Drug use-no Regular exercise-no  Review of Systems  The patient denies anorexia, fever, weight loss, weight gain, vision loss, decreased hearing, hoarseness, chest pain, syncope, dyspnea on exertion, peripheral edema, prolonged cough, headaches, hemoptysis, abdominal pain, melena, hematochezia, severe indigestion/heartburn, hematuria, incontinence, genital sores, muscle weakness, suspicious skin lesions, transient blindness, difficulty walking, depression, unusual weight change, abnormal bleeding, enlarged lymph nodes, angioedema, and breast masses.    Physical Exam  General:  VS: noted, all normal. Gen: Alert, well appearing, oriented x 4. HEENT: Scalp without lesions or hair loss.  Ears: EACs clear, normal epithelium.  TMs with good light reflex and landmarks bilaterally.  Eyes: no  injection, icteris, swelling, or exudate.  EOMI, PERRLA. Nose: no drainage or turbinate edema/swelling.  No inection or focal lesion.  Mouth: lips without lesion/swelling.  Oral mucosa pink and moist.  Dentition intact and without obvious caries or gingival swelling.  Oropharynx without erythema, exudate, or swelling.  Neck: supple.  No lymphadenopathy, thyromegaly, or mass. Chest: symmetric expansion, with nonlabored respirations.  Clear and equal breath sounds in all lung fields.   CV: RRR, no m/r/g.  Peripheral pulses 2+/symmetric. Neuro: CN 2-12 intact bilaterally, strength 5/5 in proximal and distal upper extremities and lower extremities bilaterally.  No sensory  deficits.  No tremor.  No disdiadokinesis.  No ataxia.  Upper extremity and lower extremity DTRs symmetric.  No pronator drift. Dix-Halpike maneuvers do induce 3-4 seconds of lightheaded feeling but no nystagmus and no vertigo or nausea. Head:  normocephalic and no abnormalities observed.   Eyes:  vision grossly intact, pupils equal, and pupils round.   Ears:  R ear normal and no external deformities.   Nose:  no external deformity.  no nasal discharge.   Neck:  supple and full ROM.   Lungs:  normal respiratory effort, no crackles, and no wheezes.   Heart:  normal rate, regular rhythm, and physiological split S2.   Abdomen:  Bowel sounds positive,abdomen soft and non-tender without masses, organomegaly or hernias noted.   Impression & Recommendations:  Problem # 1:  HYPERTENSION (ICD-401.9)  Her updated medication list for this problem includes:    Lopressor 50 Mg Tabs (Metoprolol tartrate) .[name]... 1/2 twice a day    Benicar Hct 20-12.5 Mg Tabs (Olmesartan medoxomil-hctz) ..... Once daily  BP today: 134/70 Prior BP: 124/80 (03/01/2010)  Prior 10 Yr Risk Heart Disease: N/A (05/07/2007)  Labs Reviewed: K+: 4.2 (03/01/2010) Creat: : 0.9 (03/01/2010)   Chol: 162 (04/11/2009)   HDL: 24.10 (04/11/2009)   LDL: 75 (09/27/2007)   TG: 211.0 (04/11/2009)  Problem # 2:  HYPERLIPIDEMIA (ICD-272.4) Assessment: Unchanged  Her updated medication list for this problem includes:    Crestor 10 Mg Tabs (Rosuvastatin calcium) ..... Once daily  Labs Reviewed: SGOT: 17 (03/01/2010)   SGPT: 13 (03/01/2010)  Prior 10 Yr Risk Heart Disease: N/A (05/07/2007)   HDL:24.10 (04/11/2009), 26.3 (09/27/2007)  LDL:75 (09/27/2007), 120 CALC (01/20/2006)  Chol:162 (04/11/2009), 134 (09/27/2007)  Trig:211.0 (04/11/2009), 163 (09/27/2007)  Problem # 3:  DIABETES MELLITUS, TYPE II (ICD-250.00)  Her updated medication list for this problem includes:    Baby Aspirin 81 Mg Chew (Aspirin) ..... Once daily     Metformin Hcl 1000 Mg Tabs (Metformin hcl) ..... One by mouth two times a day 6 pm and 4 am on work days and 6 pm and in the am when you awaken ( 7-8am) on non work days    Benicar Hct 20-12.5 Mg Tabs (Olmesartan medoxomil-hctz) ..... Once daily    Onglyza 5 Mg Tabs (Saxagliptin hcl) ..... One by mouth daily    Glimepiride 4 Mg Tabs (Glimepiride) ..... One by mouth at 6 pm  Labs Reviewed: Creat: 0.9 (03/01/2010)     Last Eye Exam: No diabetic retinopathy.    (12/13/2008) Reviewed HgBA1c results: 7.7 (03/01/2010)  7.4 (11/14/2009)  Problem # 4:  DIZZINESS (ICD-780.4) inner ear  Complete Medication List: 1)  Baby Aspirin 81 Mg Chew (Aspirin) .... Once daily 2)  Metformin Hcl 1000 Mg Tabs (Metformin hcl) .... One by mouth two times a day 6 pm and 4 am on work days and 6 pm and in the am when  you awaken ( 7-8am) on non work days 3)  Lopressor 50 Mg Tabs (Metoprolol tartrate) .... 1/2 twice a day 4)  Crestor 10 Mg Tabs (Rosuvastatin calcium) .... Once daily 5)  Allegra-d 24 Hour 180-240 Mg Tb24 (Fexofenadine-pseudoephedrine) .... As needed 6)  Osteo Complex Caps (Multiple vitamins-minerals) .... Once daily 7)  Benicar Hct 20-12.5 Mg Tabs (Olmesartan medoxomil-hctz) .... Once daily 8)  Onglyza 5 Mg Tabs (Saxagliptin hcl) .... One by mouth daily 9)  Sarna Sensitive 1 % Lotn (Pramoxine hcl) 10)  Accu-chek Compact Test Drum Strp (Glucose blood) .[name].. 1 three times a day 11)  Glimepiride 4 Mg Tabs (Glimepiride) .... One by mouth at 6 pm  Hypertension Assessment/Plan:      The patient's hypertensive risk group is category C: Target organ damage and/or diabetes.  Today's blood pressure is 134/70.  Her blood pressure goal is < 130/80.  Patient Instructions: 1)  Please schedule a follow-up appointment in 4 months. 2)  BMP prior to visit, ICD-9: 3)  HbgA1C prior to visit, ICD-9:250.82 4)  Lipid Panel prior to visit, ICD-9:272.4 5)  eight hours fast but can drink  water Prescriptions: GLIMEPIRIDE 4 MG TABS (GLIMEPIRIDE) one by mouth at 6 pm  #30 x 11   Entered and Authorized by:   Stacie Glaze MD   Signed by:   Stacie Glaze MD on 03/06/2010   Method used:   [facility] [phone]* (retail)       [address]       [place], [place]  [address]       Ph: [phone] or [phone]       Fax: [phone]   RxID:   [id number] METFORMIN HCL 1000 MG TABS (METFORMIN HCL) one by mouth two times a day 6 PM and 4 AM on work days and 6 PM and in the AM when you awaken ( 7-8AM) on non work days  #60 x 11   Entered and Authorized by:   Stacie Glaze MD   Signed by:   Stacie Glaze MD on 03/06/2010   Method used:   [facility]        [facility] [address] [phone]* (retail)       [address]       [place], [place]  [address]       Ph: [phone] or [phone]       Fax: [phone]   RxID:   [id number]    Orders Added: 1)  Est. Patient Level IV [42706]

## 2010-05-14 NOTE — Assessment & Plan Note (Signed)
Summary: cpx/mm rsc per pt/njr/daughter rescd from [facility]//ccm   Vital Signs:  Patient profile:   55 year old female Height:      63 inches Weight:      172 pounds BMI:     30.58 Temp:     98.2 degrees F oral Pulse rate:   72 / minute Resp:     14 per minute BP sitting:   130 / 80  (left arm)  Vitals Entered By: Willy Eddy, LPN (April 27, 2009 8:52 AM) CC: Hypertension Management   CC:  Hypertension Management.  History of Present Illness: The pt dose no thing that diet and additional sweets are the issue with the elevation in the A1C but the Trigylcerides are up as well with the low HDl consistant with metabolic syndrome  The pt presents today for CPX and thjs above was noted in the screening labs The pt was asked about all immunizations, health maint. services that are appropriate to their age and was given guidance on diet exercize  and weight management   Hypertension History:      She denies headache, chest pain, palpitations, dyspnea with exertion, orthopnea, PND, peripheral edema, visual symptoms, neurologic problems, syncope, and side effects from treatment.        Positive major cardiovascular risk factors include female age 80 years old or older, diabetes, hyperlipidemia, and hypertension.  Negative major cardiovascular risk factors include non-tobacco-user status.        Positive history for target organ damage include ASHD (either angina/prior MI/prior CABG).     Preventive Screening-Counseling & Management  Alcohol-Tobacco     Smoking Status: quit     Year Quit: 2010     Pack years: 20  Caffeine-Diet-Exercise     Diet Comments: obese     Does Patient Exercise: no     Depression Counseling: no aplicable  Problems Prior to Update: 1)  Preventive Health Care  (ICD-V70.0) 2)  Shingles  (ICD-053.9) 3)  Contact Dermatitis Due To Poison Ivy  (ICD-692.6) 4)  Monilial Vaginitis  (ICD-112.1) 5)  Viral Exanthem, Acute  (ICD-057.9) 6)  Uns Advrs Eff Uns Rx  Medicinal&biological Sbstnc  (ICD-995.20) 7)  Osteoarthritis  (ICD-715.90) 8)  Family History of Cad Female 1st Degree Relative <60  (ICD-V16.49) 9)  Family History of Cad Female 1st Degree Relative <50  (ICD-V17.3) 10)  Low Back Pain  (ICD-724.2) 11)  Hyperlipidemia  (ICD-272.4) 12)  Hypertension  (ICD-401.9) 13)  Diabetes Mellitus, Type II  (ICD-250.00) 14)  Coronary Artery Disease  (ICD-414.00) 15)  Anemia-nos  (ICD-285.9)  Medications Prior to Update: 1)  Baby Aspirin 81 Mg  Chew (Aspirin) .... Once Daily 2)  Glucovance 5-500 Mg  Tabs (Glyburide-Metformin) .... Three Times A Day 3)  Lopressor 50 Mg  Tabs (Metoprolol Tartrate) .... 1/2 Twice A Day 4)  Crestor 10 Mg  Tabs (Rosuvastatin Calcium) .... Once Daily 5)  Allegra-D 24 Hour 180-240 Mg  Tb24 (Fexofenadine-Pseudoephedrine) .... As Needed 6)  Osteo Complex   Caps (Multiple Vitamins-Minerals) .... Once Daily 7)  Benicar Hct 20-12.5 Mg  Tabs (Olmesartan Medoxomil-Hctz) .... Once Daily 8)  Januvia 100 Mg  Tabs (Sitagliptin Phosphate) .... One By Mouth Day 9)  Sarna Sensitive 1 % Lotn (Pramoxine Hcl) 10)  Xyzal 5 Mg Tabs (Levocetirizine Dihydrochloride) .... One Two Times A Day For 7 Days For Itching  Current Medications (verified): 1)  Baby Aspirin 81 Mg  Chew (Aspirin) .... Once Daily 2)  Glucovance 5-500 Mg  Tabs (  Glyburide-Metformin) .... Three Times A Day 3)  Lopressor 50 Mg  Tabs (Metoprolol Tartrate) .... 1/2 Twice A Day 4)  Crestor 10 Mg  Tabs (Rosuvastatin Calcium) .... Once Daily 5)  Allegra-D 24 Hour 180-240 Mg  Tb24 (Fexofenadine-Pseudoephedrine) .... As Needed 6)  Osteo Complex   Caps (Multiple Vitamins-Minerals) .... Once Daily 7)  Benicar Hct 20-12.5 Mg  Tabs (Olmesartan Medoxomil-Hctz) .... Once Daily 8)  Januvia 100 Mg  Tabs (Sitagliptin Phosphate) .... One By Mouth Day 9)  Sarna Sensitive 1 % Lotn (Pramoxine Hcl) 10)  Xyzal 5 Mg Tabs (Levocetirizine Dihydrochloride) .... One Two Times A Day For 7 Days For  Itching  Allergies (verified): No Known Drug Allergies  Past History:  Family History: Last updated: 11/19/2006 father Family History of CAD Female 1st degree relative <50 mother Family History of CAD Female 1st degree relative <60 Family History Hypertension  Social History: Last updated: 11/19/2006 Married Former Smoker Alcohol use-no Drug use-no Regular exercise-no  Risk Factors: Diet: obese (04/27/2009) Exercise: no (04/27/2009)  Risk Factors: Smoking Status: quit (04/27/2009)  Past medical, surgical, family and social histories (including risk factors) reviewed, and no changes noted (except as noted below).  Past Medical History: Reviewed history from 05/07/2007 and no changes required. Diabetes mellitus, type II Hypertension Low back pain Hyperlipidemia Coronary artery disease Osteoarthritis  Past Surgical History: Reviewed history from 04/24/2008 and no changes required. Caesarean section knee surgery for torn cartilige Carpal tunnel release both hands Lumpectomy stents 2001 Rotator cuff surgery triger finger repair  Family History: Reviewed history from 11/19/2006 and no changes required. father Family History of CAD Female 1st degree relative <50 mother Family History of CAD Female 1st degree relative <60 Family History Hypertension  Social History: Reviewed history from 11/19/2006 and no changes required. Married Former Smoker Alcohol use-no Drug use-no Regular exercise-no  Physical Exam  General:  alert.  well-developed.   Head:  normocephalic and no abnormalities observed.   Eyes:  vision grossly intact, pupils equal, and pupils round.   Ears:  R ear normal and no external deformities.   Nose:  no external deformity.  no nasal discharge.   Neck:  supple and full ROM.   Lungs:  normal respiratory effort, no crackles, and no wheezes.   Heart:  normal rate, regular rhythm, and physiological split S2.   Abdomen:  Bowel sounds  positive,abdomen soft and non-tender without masses, organomegaly or hernias noted. Msk:  normal ROM and no crepitation.   Extremities:  trace left pedal edema and trace right pedal edema.   Neurologic:  alert & oriented X3 and finger-to-nose normal.     Impression & Recommendations:  Problem # 1:  PREVENTIVE HEALTH CARE (ICD-V70.0)  The pt was asked about all immunizations, health maint. services that are appropriate to their age and was given guidance on diet exercize  and weight management [facility] Mammogram: normal (05/13/2008) due now Pap smear: normal (05/13/2008) Colonoscopy: Adenomatous Polyp (12/25/2005) Td Booster: Tdap (04/27/2009)   Flu Vax: Fluvax 3+ (01/05/2009)   Pneumovax: Pneumovax (04/24/2008) Chol: 162 (04/11/2009)   HDL: 24.10 (04/11/2009)   LDL: 75 (09/27/2007)   TG: 211.0 (04/11/2009) TSH: 2.88 (04/11/2009)   HgbA1C: 7.2 (04/11/2009)   Next mammogram due:: 05/2009 (04/27/2009) Next Colonoscopy due:: 12/2010 (04/27/2009)  Discussed using sunscreen, use of alcohol, drug use, self breast exam, routine dental care, routine eye care, schedule for GYN exam, routine physical exam, seat belts, multiple vitamins, osteoporosis prevention, adequate calcium intake in diet,  recommendations for immunizations, mammograms and Pap smears.  Discussed exercise and checking cholesterol.  Discussed gun safety, safe sex, and contraception.  Mammogram: normal (05/13/2008) Pap smear: normal (05/13/2008) Colonoscopy: Adenomatous Polyp (12/25/2005) Td Booster: Tdap (04/27/2009)   Flu Vax: Fluvax 3+ (01/05/2009)   Pneumovax: Pneumovax (04/24/2008) Chol: 162 (04/11/2009)   HDL: 24.10 (04/11/2009)   LDL: 75 (09/27/2007)   TG: 211.0 (04/11/2009) TSH: 2.88 (04/11/2009)   HgbA1C: 7.2 (04/11/2009)   Next mammogram due:: 05/2009 (04/27/2009) Next Colonoscopy due:: 12/2010 (04/27/2009)  Discussed using sunscreen, use of alcohol, drug use, self breast exam, routine dental care,  routine eye care, schedule for GYN exam, routine physical exam, seat belts, multiple vitamins, osteoporosis prevention, adequate calcium intake in diet, recommendations for immunizations, mammograms and Pap smears.  Discussed exercise and checking cholesterol.  Discussed gun safety, safe sex, and contraception.  Problem # 2:  DIABETES MELLITUS, TYPE II (ICD-250.00) Assessment: Deteriorated  the pts A1c has increased and we will add ongyza as a replacement or the [place] wiht monitering of diet and exercize Her updated medication list for this problem includes:    Baby Aspirin 81 Mg Chew (Aspirin) ..... Once daily    Glucovance 5-500 Mg Tabs (Glyburide-metformin) .[name]... Three times a day    Benicar Hct 20-12.5 Mg Tabs (Olmesartan medoxomil-hctz) ..... Once daily    Onglyza 5 Mg Tabs (Saxagliptin hcl) ..... One by mouth daily  Labs Reviewed: Creat: 1.0 (04/11/2009)     Last Eye Exam: No diabetic retinopathy.    (12/13/2008) Reviewed HgBA1c results: 7.2 (04/11/2009)  6.9 (09/19/2008)  Problem # 3:  OTHER SEBORRHEIC DERMATITIS (ICD-690.18) lotrisome 4 drops three times a day in affecteed ear  Complete Medication List: 1)  Baby Aspirin 81 Mg Chew (Aspirin) .... Once daily 2)  Glucovance 5-500 Mg Tabs (Glyburide-metformin) .... Three times a day 3)  Lopressor 50 Mg Tabs (Metoprolol tartrate) .... 1/2 twice a day 4)  Crestor 10 Mg Tabs (Rosuvastatin calcium) .... Once daily 5)  Allegra-d 24 Hour 180-240 Mg Tb24 (Fexofenadine-pseudoephedrine) .... As needed 6)  Osteo Complex Caps (Multiple vitamins-minerals) .... Once daily 7)  Benicar Hct 20-12.5 Mg Tabs (Olmesartan medoxomil-hctz) .... Once daily 8)  Onglyza 5 Mg Tabs (Saxagliptin hcl) .... One by mouth daily 9)  Sarna Sensitive 1 % Lotn (Pramoxine hcl) 10)  Xyzal 5 Mg Tabs (Levocetirizine dihydrochloride) .... One two times a day for 7 days for itching 11)  Clotrimazole-betamethasone 1-0.05 % Lotn (Clotrimazole-betamethasone) .... Two  to four drops in effected ear three times a day  Other Orders: EKG w/ Interpretation (93000) Tdap => 43yrs IM (69629) Admin 1st Vaccine (52841)  Hypertension Assessment/Plan:      The patient's hypertensive risk group is category C: Target organ damage and/or diabetes.  Today's blood pressure is 130/80.  Her blood pressure goal is < 130/80.  Patient Instructions: 1)  Please schedule a follow-up appointment in 3 months. 2)  HbgA1C prior to visit, ICD-9:250.00 Prescriptions: CLOTRIMAZOLE-BETAMETHASONE 1-0.05 % LOTN (CLOTRIMAZOLE-BETAMETHASONE) two to four drops in effected ear three times a day  #30cc x 0   Entered and Authorized by:   Stacie Glaze MD   Signed by:   Stacie Glaze MD on 04/27/2009   Method used:   Electronically to        [facility] [phone]* (retail)       [address]       [address]       [place], [place]  [address]  Ph: [phone]       Fax: [phone]   RxID:   [id number] ONGLYZA 5 MG TABS (SAXAGLIPTIN HCL) one by mouth daily  #30 x 11   Entered and Authorized by:   Stacie Glaze MD   Signed by:   Stacie Glaze MD on 04/27/2009   Method used:   Electronically to        [facility] [address] [phone]* (retail)       [address]       [address]       [place], [place]  [address]       Ph: [phone]       Fax: [phone]   RxID:   [id number]    Preventive Care Screening  Colonoscopy:    Date:  12/25/2005    Next Due:  12/2010    Results:  Adenomatous Polyp   Mammogram:    Date:  05/13/2008    Next Due:  05/2009    Results:  normal   Pap Smear:    Date:  05/13/2008    Next Due:  05/2009    Results:  normal      Immunizations Administered:  Tetanus Vaccine:    Vaccine Type: Tdap    Site: left deltoid    Mfr: Sanofi Pasteur    Dose: 0.5 ml    Route: IM    Given by: Willy Eddy, LPN    Exp. Date: 10/11/2010    Lot #: [id number]

## 2010-05-14 NOTE — Assessment & Plan Note (Signed)
Summary: 3 month rov/njr/pt rescd/cm---PT [facility] // RS   Vital Signs:  Patient profile:   55 year old female Height:      63 inches Weight:      166 pounds BMI:     29.51 Temp:     98.2 degrees F oral Pulse rate:   72 / minute Resp:     14 per minute BP sitting:   110 / 70  (left arm)  Vitals Entered By: Willy Eddy, LPN (December 11, 2009 3:02 PM) CC: roa, Hypertension Management Is Patient Diabetic? No   CC:  roa and Hypertension Management.  History of Present Illness: review of the [facility] report on the carotids with some preogression of the dz and still smoking!!!!!!! Had ani nfection in her gum that resulted in a syncopal episode No curretn chest pains, abnormal SOB follow up for the [facility]'s home AM readings 120 range but AiC7.4   Hypertension History:      She denies headache, chest pain, palpitations, dyspnea with exertion, orthopnea, PND, peripheral edema, visual symptoms, neurologic problems, syncope, and side effects from treatment.        Positive major cardiovascular risk factors include female age 25 years old or older, diabetes, hyperlipidemia, and hypertension.  Negative major cardiovascular risk factors include non-tobacco-user status.        Positive history for target organ damage include ASHD (either angina/prior MI/prior CABG).      Preventive Screening-Counseling & Management  Alcohol-Tobacco     Smoking Status: quit     Smoke Cessation Stage: contemplative     Year Quit: 2010     Pack years: 20     Tobacco Counseling: to quit use of tobacco products  Comments: high risk pt  Current Problems (verified): 1)  Other Seborrheic Dermatitis  (ICD-690.18) 2)  Preventive Health Care  (ICD-V70.0) 3)  Shingles  (ICD-053.9) 4)  Contact Dermatitis Due To Poison Ivy  (ICD-692.6) 5)  Monilial Vaginitis  (ICD-112.1) 6)  Viral Exanthem, Acute  (ICD-057.9) 7)  Uns Advrs Eff Uns Rx Medicinal&biological Sbstnc  (ICD-995.20) 8)  Osteoarthritis  (ICD-715.90) 9)   Family History of Cad Female 1st Degree Relative <60  (ICD-V16.49) 10)  Family History of Cad Female 1st Degree Relative <50  (ICD-V17.3) 11)  Low Back Pain  (ICD-724.2) 12)  Hyperlipidemia  (ICD-272.4) 13)  Hypertension  (ICD-401.9) 14)  Diabetes Mellitus, Type II  (ICD-250.00) 15)  Coronary Artery Disease  (ICD-414.00) 16)  Anemia-nos  (ICD-285.9)  Current Medications (verified): 1)  Baby Aspirin 81 Mg  Chew (Aspirin) .... Once Daily 2)  Glucovance 5-500 Mg  Tabs (Glyburide-Metformin) .... Three Times A Day 3)  Lopressor 50 Mg  Tabs (Metoprolol Tartrate) .... 1/2 Twice A Day 4)  Crestor 10 Mg  Tabs (Rosuvastatin Calcium) .... Once Daily 5)  Allegra-D 24 Hour 180-240 Mg  Tb24 (Fexofenadine-Pseudoephedrine) .... As Needed 6)  Osteo Complex   Caps (Multiple Vitamins-Minerals) .... Once Daily 7)  Benicar Hct 20-12.5 Mg  Tabs (Olmesartan Medoxomil-Hctz) .... Once Daily 8)  Onglyza 5 Mg Tabs (Saxagliptin Hcl) .... One By Mouth Daily 9)  Sarna Sensitive 1 % Lotn (Pramoxine Hcl) 10)  Clotrimazole-Betamethasone 1-0.05 % Lotn (Clotrimazole-Betamethasone) .... Two To Four Drops in Effected Ear Three Times A Day 11)  Accu-Chek Compact Test Drum  Strp (Glucose Blood) .[name].. 1 Three Times A Day  Allergies (verified): No Known Drug Allergies  Past History:  Family History: Last updated: 11/19/2006 father Family History of CAD Female 1st degree relative <  50 mother Family History of CAD Female 1st degree relative <60 Family History Hypertension  Social History: Last updated: 11/19/2006 Married Former Smoker Alcohol use-no Drug use-no Regular exercise-no  Risk Factors: Diet: obese (04/27/2009) Exercise: no (04/27/2009)  Risk Factors: Smoking Status: quit (12/11/2009)  Past medical, surgical, family and social histories (including risk factors) reviewed, and no changes noted (except as noted below).  Past Medical History: Reviewed history from 05/07/2007 and no changes  required. Diabetes mellitus, type II Hypertension Low back pain Hyperlipidemia Coronary artery disease Osteoarthritis  Past Surgical History: Reviewed history from 04/24/2008 and no changes required. Caesarean section knee surgery for torn cartilige Carpal tunnel release both hands Lumpectomy stents 2001 Rotator cuff surgery triger finger repair  Family History: Reviewed history from 11/19/2006 and no changes required. father Family History of CAD Female 1st degree relative <50 mother Family History of CAD Female 1st degree relative <60 Family History Hypertension  Social History: Reviewed history from 11/19/2006 and no changes required. Married Former Smoker Alcohol use-no Drug use-no Regular exercise-no  Review of Systems  The patient denies anorexia, fever, weight loss, weight gain, vision loss, decreased hearing, hoarseness, chest pain, syncope, dyspnea on exertion, peripheral edema, prolonged cough, headaches, hemoptysis, abdominal pain, melena, hematochezia, severe indigestion/heartburn, hematuria, incontinence, genital sores, muscle weakness, suspicious skin lesions, transient blindness, difficulty walking, depression, unusual weight change, abnormal bleeding, enlarged lymph nodes, angioedema, and breast masses.    Physical Exam  General:  alert.  well-developed.   Head:  normocephalic and no abnormalities observed.   Eyes:  vision grossly intact, pupils equal, and pupils round.   Ears:  R ear normal and no external deformities.   Nose:  no external deformity.  no nasal discharge.   Mouth:  pharynx pink and moist and no erythema.   Neck:  supple and full ROM.   Lungs:  normal respiratory effort, no crackles, and no wheezes.   Heart:  normal rate, regular rhythm, and physiological split S2.   Abdomen:  Bowel sounds positive,abdomen soft and non-tender without masses, organomegaly or hernias noted. Msk:  normal ROM and no joint tenderness.    Diabetes Management  Exam:    Foot Exam (with socks and/or shoes not present):       Sensory-Pinprick/Light touch:          Left medial foot (L-4): diminished          Left dorsal foot (L-5): diminished          Left lateral foot (S-1): diminished          Right medial foot (L-4): diminished          Right dorsal foot (L-5): diminished          Right lateral foot (S-1): diminished       Sensory-Monofilament:          Left foot: diminished          Right foot: diminished       Inspection:          Left foot: normal       Nails:          Left foot: normal          Right foot: normal    Impression & Recommendations:  Problem # 1:  HYPERTENSION (ICD-401.9)  Her updated medication list for this problem includes:    Lopressor 50 Mg Tabs (Metoprolol tartrate) .[name]... 1/2 twice a day    Benicar Hct 20-12.5 Mg Tabs (Olmesartan medoxomil-hctz) .Marland KitchenMarland KitchenMarland KitchenMarland Kitchen  Once daily  BP today: 110/70 Prior BP: 130/74 (08/08/2009)  Prior 10 Yr Risk Heart Disease: N/A (05/07/2007)  Labs Reviewed: K+: 4.0 (04/11/2009) Creat: : 1.0 (04/11/2009)   Chol: 162 (04/11/2009)   HDL: 24.10 (04/11/2009)   LDL: 75 (09/27/2007)   TG: 211.0 (04/11/2009)  Problem # 2:  DIABETES MELLITUS, TYPE II (ICD-250.00) the a1c is up  could this be due to the tooth infection Her updated medication list for this problem includes:    Baby Aspirin 81 Mg Chew (Aspirin) ..... Once daily    Glucovance 5-500 Mg Tabs (Glyburide-metformin) .[name]... Three times a day    Benicar Hct 20-12.5 Mg Tabs (Olmesartan medoxomil-hctz) ..... Once daily    Onglyza 5 Mg Tabs (Saxagliptin hcl) ..... One by mouth daily  Labs Reviewed: Creat: 1.0 (04/11/2009)     Last Eye Exam: No diabetic retinopathy.    (12/13/2008) Reviewed HgBA1c results: 7.4 (11/14/2009)  7.3 (07/30/2009)  Problem # 3:  ANEMIA-NOS (ICD-285.9)  Hgb: 13.2 (04/11/2009)   Hct: 37.8 (04/11/2009)   Platelets: 218.0 (04/11/2009) RBC: 3.86 (04/11/2009)   RDW: 13.1 (04/11/2009)   WBC: 6.6 (04/11/2009) MCV:  98.1 (04/11/2009)   MCHC: 34.8 (04/11/2009) TSH: 2.88 (04/11/2009)  Problem # 4:  CORONARY ARTERY DISEASE (ICD-414.00)  Her updated medication list for this problem includes:    Baby Aspirin 81 Mg Chew (Aspirin) ..... Once daily    Lopressor 50 Mg Tabs (Metoprolol tartrate) .[name]... 1/2 twice a day    Benicar Hct 20-12.5 Mg Tabs (Olmesartan medoxomil-hctz) ..... Once daily  Labs Reviewed: Chol: 162 (04/11/2009)   HDL: 24.10 (04/11/2009)   LDL: 75 (09/27/2007)   TG: 211.0 (04/11/2009)  Problem # 5:  HYPERLIPIDEMIA (ICD-272.4)  Her updated medication list for this problem includes:    Crestor 10 Mg Tabs (Rosuvastatin calcium) ..... Once daily  Labs Reviewed: SGOT: 26 (04/11/2009)   SGPT: 21 (04/11/2009)  Prior 10 Yr Risk Heart Disease: N/A (05/07/2007)   HDL:24.10 (04/11/2009), 26.3 (09/27/2007)  LDL:75 (09/27/2007), 120 CALC (01/20/2006)  Chol:162 (04/11/2009), 134 (09/27/2007)  Trig:211.0 (04/11/2009), 163 (09/27/2007)  Problem # 6:  ANEMIA-NOS (ICD-285.9)  Complete Medication List: 1)  Baby Aspirin 81 Mg Chew (Aspirin) .... Once daily 2)  Glucovance 5-500 Mg Tabs (Glyburide-metformin) .... Three times a day 3)  Lopressor 50 Mg Tabs (Metoprolol tartrate) .... 1/2 twice a day 4)  Crestor 10 Mg Tabs (Rosuvastatin calcium) .... Once daily 5)  Allegra-d 24 Hour 180-240 Mg Tb24 (Fexofenadine-pseudoephedrine) .... As needed 6)  Osteo Complex Caps (Multiple vitamins-minerals) .... Once daily 7)  Benicar Hct 20-12.5 Mg Tabs (Olmesartan medoxomil-hctz) .... Once daily 8)  Onglyza 5 Mg Tabs (Saxagliptin hcl) .... One by mouth daily 9)  Sarna Sensitive 1 % Lotn (Pramoxine hcl) 10)  Clotrimazole-betamethasone 1-0.05 % Lotn (Clotrimazole-betamethasone) .... Two to four drops in effected ear three times a day 11)  Accu-chek Compact Test Drum Strp (Glucose blood) .[name].. 1 three times a day  Hypertension Assessment/Plan:      The patient's hypertensive risk group is category C: Target organ damage  and/or diabetes.  Today's blood pressure is 110/70.  Her blood pressure goal is < 130/80.  Patient Instructions: 1)  Please schedule a follow-up appointment  3 month 2)  HbgA1C prior to visit, ICD-9:250.82

## 2010-05-14 NOTE — Assessment & Plan Note (Signed)
Summary: 3 month rov/njr/pt rescd from bump//ccm   Vital Signs:  Patient profile:   55 year old female Height:      63 inches Weight:      170 pounds BMI:     30.22 Temp:     98.2 degrees F oral Pulse rate:   72 / minute Resp:     14 per minute BP sitting:   130 / 74  (left arm)  Vitals Entered By: Willy Eddy, LPN (August 08, 2009 4:24 PM) CC: roa labs, Hypertension Management, Type 2 diabetes mellitus follow-up   CC:  roa labs, Hypertension Management, and Type 2 diabetes mellitus follow-up.  History of Present Illness: she presents for follow up of blood glucose measurements she is now on 3rd shift and this has an effect on the blood glucose readings   Type 2 Diabetes Mellitus Follow-Up      This is a 55 year old woman who presents for Type 2 diabetes mellitus follow-up.  The patient denies polyuria, polydipsia, blurred vision, self managed hypoglycemia, hypoglycemia requiring help, weight loss, weight gain, and numbness of extremities.  The patient denies the following symptoms: neuropathic pain, chest pain, vomiting, orthostatic symptoms, poor wound healing, intermittent claudication, vision loss, and foot ulcer.  Since the last visit the patient reports good dietary compliance.  The patient has been measuring capillary blood glucose before breakfast.  Since the last visit, the patient reports having had eye care by an ophthalmologist.  due repeat in september  due mamgram and papsmear   Hypertension History:      She denies headache, chest pain, palpitations, dyspnea with exertion, orthopnea, PND, peripheral edema, visual symptoms, neurologic problems, syncope, and side effects from treatment.        Positive major cardiovascular risk factors include female age 67 years old or older, diabetes, hyperlipidemia, and hypertension.  Negative major cardiovascular risk factors include non-tobacco-user status.        Positive history for target organ damage include ASHD (either  angina/prior MI/prior CABG).     Preventive Screening-Counseling & Management  Alcohol-Tobacco     Smoking Status: quit     Year Quit: 2010     Pack years: 20  Problems Prior to Update: 1)  Other Seborrheic Dermatitis  (ICD-690.18) 2)  Preventive Health Care  (ICD-V70.0) 3)  Shingles  (ICD-053.9) 4)  Contact Dermatitis Due To Poison Ivy  (ICD-692.6) 5)  Monilial Vaginitis  (ICD-112.1) 6)  Viral Exanthem, Acute  (ICD-057.9) 7)  Uns Advrs Eff Uns Rx Medicinal&biological Sbstnc  (ICD-995.20) 8)  Osteoarthritis  (ICD-715.90) 9)  Family History of Cad Female 1st Degree Relative <60  (ICD-V16.49) 10)  Family History of Cad Female 1st Degree Relative <50  (ICD-V17.3) 11)  Low Back Pain  (ICD-724.2) 12)  Hyperlipidemia  (ICD-272.4) 13)  Hypertension  (ICD-401.9) 14)  Diabetes Mellitus, Type II  (ICD-250.00) 15)  Coronary Artery Disease  (ICD-414.00) 16)  Anemia-nos  (ICD-285.9)  Current Problems (verified): 1)  Other Seborrheic Dermatitis  (ICD-690.18) 2)  Preventive Health Care  (ICD-V70.0) 3)  Shingles  (ICD-053.9) 4)  Contact Dermatitis Due To Poison Ivy  (ICD-692.6) 5)  Monilial Vaginitis  (ICD-112.1) 6)  Viral Exanthem, Acute  (ICD-057.9) 7)  Uns Advrs Eff Uns Rx Medicinal&biological Sbstnc  (ICD-995.20) 8)  Osteoarthritis  (ICD-715.90) 9)  Family History of Cad Female 1st Degree Relative <60  (ICD-V16.49) 10)  Family History of Cad Female 1st Degree Relative <50  (ICD-V17.3) 11)  Low Back Pain  (ICD-724.2)  12)  Hyperlipidemia  (ICD-272.4) 13)  Hypertension  (ICD-401.9) 14)  Diabetes Mellitus, Type II  (ICD-250.00) 15)  Coronary Artery Disease  (ICD-414.00) 16)  Anemia-nos  (ICD-285.9)  Medications Prior to Update: 1)  Baby Aspirin 81 Mg  Chew (Aspirin) .... Once Daily 2)  Glucovance 5-500 Mg  Tabs (Glyburide-Metformin) .... Three Times A Day 3)  Lopressor 50 Mg  Tabs (Metoprolol Tartrate) .... 1/2 Twice A Day 4)  Crestor 10 Mg  Tabs (Rosuvastatin Calcium) .... Once  Daily 5)  Allegra-D 24 Hour 180-240 Mg  Tb24 (Fexofenadine-Pseudoephedrine) .... As Needed 6)  Osteo Complex   Caps (Multiple Vitamins-Minerals) .... Once Daily 7)  Benicar Hct 20-12.5 Mg  Tabs (Olmesartan Medoxomil-Hctz) .... Once Daily 8)  Onglyza 5 Mg Tabs (Saxagliptin Hcl) .... One By Mouth Daily 9)  Sarna Sensitive 1 % Lotn (Pramoxine Hcl) 10)  Xyzal 5 Mg Tabs (Levocetirizine Dihydrochloride) .... One Two Times A Day For 7 Days For Itching 11)  Clotrimazole-Betamethasone 1-0.05 % Lotn (Clotrimazole-Betamethasone) .... Two To Four Drops in Effected Ear Three Times A Day 12)  Accu-Chek Compact Test Drum  Strp (Glucose Blood) .[name].. 1 Three Times A Day  Current Medications (verified): 1)  Baby Aspirin 81 Mg  Chew (Aspirin) .... Once Daily 2)  Glucovance 5-500 Mg  Tabs (Glyburide-Metformin) .... Three Times A Day 3)  Lopressor 50 Mg  Tabs (Metoprolol Tartrate) .... 1/2 Twice A Day 4)  Crestor 10 Mg  Tabs (Rosuvastatin Calcium) .... Once Daily 5)  Allegra-D 24 Hour 180-240 Mg  Tb24 (Fexofenadine-Pseudoephedrine) .... As Needed 6)  Osteo Complex   Caps (Multiple Vitamins-Minerals) .... Once Daily 7)  Benicar Hct 20-12.5 Mg  Tabs (Olmesartan Medoxomil-Hctz) .... Once Daily 8)  Onglyza 5 Mg Tabs (Saxagliptin Hcl) .... One By Mouth Daily 9)  Sarna Sensitive 1 % Lotn (Pramoxine Hcl) 10)  Clotrimazole-Betamethasone 1-0.05 % Lotn (Clotrimazole-Betamethasone) .... Two To Four Drops in Effected Ear Three Times A Day 11)  Accu-Chek Compact Test Drum  Strp (Glucose Blood) .[name].. 1 Three Times A Day  Allergies (verified): No Known Drug Allergies  Past History:  Family History: Last updated: 11/19/2006 father Family History of CAD Female 1st degree relative <50 mother Family History of CAD Female 1st degree relative <60 Family History Hypertension  Social History: Last updated: 11/19/2006 Married Former Smoker Alcohol use-no Drug use-no Regular exercise-no  Risk Factors: Diet: obese  (04/27/2009) Exercise: no (04/27/2009)  Risk Factors: Smoking Status: quit (08/08/2009)  Past medical, surgical, family and social histories (including risk factors) reviewed, and no changes noted (except as noted below).  Past Medical History: Reviewed history from 05/07/2007 and no changes required. Diabetes mellitus, type II Hypertension Low back pain Hyperlipidemia Coronary artery disease Osteoarthritis  Past Surgical History: Reviewed history from 04/24/2008 and no changes required. Caesarean section knee surgery for torn cartilige Carpal tunnel release both hands Lumpectomy stents 2001 Rotator cuff surgery triger finger repair  Family History: Reviewed history from 11/19/2006 and no changes required. father Family History of CAD Female 1st degree relative <50 mother Family History of CAD Female 1st degree relative <60 Family History Hypertension  Social History: Reviewed history from 11/19/2006 and no changes required. Married Former Smoker Alcohol use-no Drug use-no Regular exercise-no  Review of Systems  The patient denies anorexia, fever, weight loss, weight gain, vision loss, decreased hearing, hoarseness, chest pain, syncope, dyspnea on exertion, peripheral edema, prolonged cough, headaches, hemoptysis, abdominal pain, melena, hematochezia, severe indigestion/heartburn, hematuria, incontinence, genital sores, muscle weakness, suspicious skin lesions,  transient blindness, difficulty walking, depression, unusual weight change, abnormal bleeding, enlarged lymph nodes, angioedema, and breast masses.    Physical Exam  General:  alert.  well-developed.   Eyes:  vision grossly intact, pupils equal, and pupils round.   Ears:  R ear normal and no external deformities.   Neck:  supple and full ROM.   Lungs:  normal respiratory effort, no crackles, and no wheezes.   Heart:  normal rate, regular rhythm, and physiological split S2.   Abdomen:  Bowel sounds  positive,abdomen soft and non-tender without masses, organomegaly or hernias noted. Msk:  normal ROM and no joint tenderness.   Skin:  turgor normal and color normal.   Psych:  memory intact for recent and remote and not anxious appearing.    Diabetes Management Exam:    Foot Exam (with socks and/or shoes not present):       Sensory-Pinprick/Light touch:          Left medial foot (L-4): normal          Left dorsal foot (L-5): normal          Left lateral foot (S-1): normal          Right medial foot (L-4): normal          Right dorsal foot (L-5): normal          Right lateral foot (S-1): normal       Sensory-Monofilament:          Left foot: normal          Right foot: normal       Inspection:          Left foot: normal          Right foot: normal       Nails:          Left foot: normal          Right foot: normal   Impression & Recommendations:  Problem # 1:  DIABETES MELLITUS, TYPE II (ICD-250.00) still has an A1c of 7.3 not bad but not a best goal weight loss Her updated medication list for this problem includes:    Baby Aspirin 81 Mg Chew (Aspirin) ..... Once daily    Glucovance 5-500 Mg Tabs (Glyburide-metformin) .[name]... Three times a day    Benicar Hct 20-12.5 Mg Tabs (Olmesartan medoxomil-hctz) ..... Once daily    Onglyza 5 Mg Tabs (Saxagliptin hcl) ..... One by mouth daily  Labs Reviewed: Creat: 1.0 (04/11/2009)     Last Eye Exam: No diabetic retinopathy.    (12/13/2008) Reviewed HgBA1c results: 7.3 (07/30/2009)  7.2 (04/11/2009)  Problem # 2:  HYPERTENSION (ICD-401.9)  Her updated medication list for this problem includes:    Lopressor 50 Mg Tabs (Metoprolol tartrate) .[name]... 1/2 twice a day    Benicar Hct 20-12.5 Mg Tabs (Olmesartan medoxomil-hctz) ..... Once daily  BP today: 130/74 Prior BP: 130/80 (04/27/2009)  Prior 10 Yr Risk Heart Disease: N/A (05/07/2007)  Labs Reviewed: K+: 4.0 (04/11/2009) Creat: : 1.0 (04/11/2009)   Chol: 162 (04/11/2009)   HDL:  24.10 (04/11/2009)   LDL: 75 (09/27/2007)   TG: 211.0 (04/11/2009)  Problem # 3:  PREVENTIVE HEALTH CARE (ICD-V70.0) refer to [facility] for pap and the [facility] for manograms  Complete Medication List: 1)  Baby Aspirin 81 Mg Chew (Aspirin) .... Once daily 2)  Glucovance 5-500 Mg Tabs (Glyburide-metformin) .... Three times a day 3)  Lopressor 50 Mg Tabs (Metoprolol tartrate) .... 1/2 twice  a day 4)  Crestor 10 Mg Tabs (Rosuvastatin calcium) .... Once daily 5)  Allegra-d 24 Hour 180-240 Mg Tb24 (Fexofenadine-pseudoephedrine) .... As needed 6)  Osteo Complex Caps (Multiple vitamins-minerals) .... Once daily 7)  Benicar Hct 20-12.5 Mg Tabs (Olmesartan medoxomil-hctz) .... Once daily 8)  Onglyza 5 Mg Tabs (Saxagliptin hcl) .... One by mouth daily 9)  Sarna Sensitive 1 % Lotn (Pramoxine hcl) 10)  Clotrimazole-betamethasone 1-0.05 % Lotn (Clotrimazole-betamethasone) .... Two to four drops in effected ear three times a day 11)  Accu-chek Compact Test Drum Strp (Glucose blood) .[name].. 1 three times a day  Hypertension Assessment/Plan:      The patient's hypertensive risk group is category C: Target organ damage and/or diabetes.  Today's blood pressure is 130/74.  Her blood pressure goal is < 130/80.  Patient Instructions: 1)  [facility] for pap 2)  Mamogram at [facility] on [address] 3)  Please schedule a follow-up appointment in 3 months. 4)  HbgA1C prior to visit, ICD-9:  250.00

## 2010-05-17 NOTE — Consult Note (Signed)
Summary: [facility]  [facility]   Imported By: Maryln Gottron 09/27/2009 09:21:36  _____________________________________________________________________  External Attachment:    Type:   Image     Comment:   External Document

## 2010-05-17 NOTE — Procedures (Signed)
Summary: Colonoscopy Report/[facility]  [facility]/ [facility]   Imported By: Maryln Gottron 04/30/2009 15:58:20  _____________________________________________________________________  External Attachment:    Type:   Image     Comment:   External Document

## 2010-06-24 ENCOUNTER — Other Ambulatory Visit: Payer: Self-pay | Admitting: Internal Medicine

## 2010-06-27 ENCOUNTER — Other Ambulatory Visit (INDEPENDENT_AMBULATORY_CARE_PROVIDER_SITE_OTHER): Payer: 59 | Admitting: Internal Medicine

## 2010-06-27 DIAGNOSIS — E785 Hyperlipidemia, unspecified: Secondary | ICD-10-CM

## 2010-06-27 DIAGNOSIS — E119 Type 2 diabetes mellitus without complications: Secondary | ICD-10-CM

## 2010-06-27 LAB — LIPID PANEL
Cholesterol: 114 mg/dL (ref 0–200)
HDL: 26.6 mg/dL — ABNORMAL LOW (ref >39.00)
VLDL: 33.8 mg/dL (ref 0.0–40.0)

## 2010-06-27 LAB — HEMOGLOBIN A1C: Hgb A1c MFr Bld: 7.8 % — ABNORMAL HIGH (ref 4.6–6.5)

## 2010-06-28 ENCOUNTER — Other Ambulatory Visit: Payer: Self-pay | Admitting: Internal Medicine

## 2010-07-02 ENCOUNTER — Encounter: Payer: Self-pay | Admitting: Internal Medicine

## 2010-07-02 LAB — POCT CARDIAC MARKERS
CKMB, poc: <1 ng/mL — ABNORMAL LOW (ref 1.0–8.0)
Myoglobin, poc: 63.2 ng/mL (ref 12–200)
Troponin i, poc: <0.05 ng/mL (ref 0.00–0.09)

## 2010-07-02 LAB — DIFFERENTIAL
Basophils Absolute: 0.1 10*3/uL (ref 0.0–0.1)
Basophils Relative: 1 % (ref 0–1)
Monocytes Relative: 4 % (ref 3–12)
Neutro Abs: 8.2 10*3/uL — ABNORMAL HIGH (ref 1.7–7.7)
Neutrophils Relative %: 81 % — ABNORMAL HIGH (ref 43–77)

## 2010-07-02 LAB — POCT I-STAT, CHEM 8
Chloride: 105 mEq/L (ref 96–112)
Glucose, Bld: 241 mg/dL — ABNORMAL HIGH (ref 70–99)
HCT: 37 % (ref 36.0–46.0)
Potassium: 4 mEq/L (ref 3.5–5.1)
Sodium: 140 mEq/L (ref 135–145)

## 2010-07-02 LAB — RAPID URINE DRUG SCREEN, HOSP PERFORMED
Amphetamines: NOT DETECTED
Barbiturates: NOT DETECTED
Benzodiazepines: NOT DETECTED
Cocaine: NOT DETECTED

## 2010-07-02 LAB — CBC
HCT: 34.1 % — ABNORMAL LOW (ref 36.0–46.0)
Hemoglobin: 11.7 g/dL — ABNORMAL LOW (ref 12.0–15.0)
MCHC: 34.1 g/dL (ref 30.0–36.0)
MCHC: 34.4 g/dL (ref 30.0–36.0)
MCV: 96.2 fL (ref 78.0–100.0)
Platelets: 218 10*3/uL (ref 150–400)
RBC: 3.57 MIL/uL — ABNORMAL LOW (ref 3.87–5.11)
RDW: 13.6 % (ref 11.5–15.5)
RDW: 13.7 % (ref 11.5–15.5)

## 2010-07-02 LAB — URINALYSIS, ROUTINE W REFLEX MICROSCOPIC
Glucose, UA: 100 mg/dL — AB
Hgb urine dipstick: NEGATIVE
Protein, ur: NEGATIVE mg/dL
pH: 8.5 — ABNORMAL HIGH (ref 5.0–8.0)

## 2010-07-02 LAB — COMPREHENSIVE METABOLIC PANEL
AST: 16 U/L (ref 0–37)
Albumin: 3.4 g/dL — ABNORMAL LOW (ref 3.5–5.2)
BUN: 14 mg/dL (ref 6–23)
CO2: 25 mEq/L (ref 19–32)
Calcium: 9 mg/dL (ref 8.4–10.5)
Glucose, Bld: 127 mg/dL — ABNORMAL HIGH (ref 70–99)
Potassium: 4.1 mEq/L (ref 3.5–5.1)
Total Bilirubin: 0.2 mg/dL — ABNORMAL LOW (ref 0.3–1.2)

## 2010-07-02 LAB — TSH: TSH: 0.79 u[IU]/mL (ref 0.350–4.500)

## 2010-07-02 LAB — GLUCOSE, CAPILLARY
Glucose-Capillary: 123 mg/dL — ABNORMAL HIGH (ref 70–99)
Glucose-Capillary: 146 mg/dL — ABNORMAL HIGH (ref 70–99)

## 2010-07-02 LAB — LIPID PANEL
Cholesterol: 169 mg/dL (ref 0–200)
HDL: 24 mg/dL — ABNORMAL LOW (ref >39)
LDL Cholesterol: 112 mg/dL — ABNORMAL HIGH (ref 0–99)
Triglycerides: 166 mg/dL — ABNORMAL HIGH (ref <150)

## 2010-07-04 ENCOUNTER — Ambulatory Visit (INDEPENDENT_AMBULATORY_CARE_PROVIDER_SITE_OTHER): Payer: 59 | Admitting: Internal Medicine

## 2010-07-04 ENCOUNTER — Encounter: Payer: Self-pay | Admitting: Internal Medicine

## 2010-07-04 VITALS — BP 142/70 | HR 76 | Temp 99.0°F | Resp 16 | Ht 67.75 in | Wt 170.0 lb

## 2010-07-04 DIAGNOSIS — R112 Nausea with vomiting, unspecified: Secondary | ICD-10-CM

## 2010-07-04 DIAGNOSIS — J069 Acute upper respiratory infection, unspecified: Secondary | ICD-10-CM

## 2010-07-04 DIAGNOSIS — E119 Type 2 diabetes mellitus without complications: Secondary | ICD-10-CM

## 2010-07-04 DIAGNOSIS — I10 Essential (primary) hypertension: Secondary | ICD-10-CM

## 2010-07-04 MED ORDER — LEVOFLOXACIN 500 MG PO TABS: 500.0000 mg | ORAL_TABLET | Freq: Every day | ORAL | Status: AC

## 2010-07-04 MED ORDER — PSEUDOEPH-CHLORPHEN-HYDROCOD 60-4-5 MG/5ML PO SOLN: 5.0000 mL | Freq: Three times a day (TID) | ORAL | Status: DC

## 2010-07-04 MED ORDER — PSEUDOEPHEDRINE-GUAIFENESIN ER 60-600 MG PO TB12: 1.0000 | ORAL_TABLET | Freq: Two times a day (BID) | ORAL | Status: DC

## 2010-07-04 MED ORDER — GLIMEPIRIDE 4 MG PO TABS: 4.0000 mg | ORAL_TABLET | Freq: Every day | ORAL | Status: DC

## 2010-07-04 MED ORDER — PROMETHAZINE HCL 25 MG PO TABS: 25.0000 mg | ORAL_TABLET | Freq: Four times a day (QID) | ORAL | Status: AC | PRN

## 2010-07-04 NOTE — Progress Notes (Signed)
  Subjective:    Patient ID: Andrea [name], female    DOB: [birth year], 55 y.o.   MRN: [id number]  HPI   patty has been sick for 2 weeks she has a low-grade fever today associated with this illness it is a sense of congestion nausea and she did vomit x1 in the office today.   she has some chills loose stools and severe postnasal drip with congestion  patient states that her blood sugars have been better since change in her diabetic medications her blood pressure has been stable on her Benicar   She has moderate elevation of her blood pressure today due to the use of decongestants  Review of Systems  Constitutional: Positive for chills and appetite change.  HENT: Positive for congestion, sore throat, rhinorrhea, voice change and postnasal drip.   Respiratory: Positive for cough, chest tightness and shortness of breath.   Gastrointestinal: Positive for nausea and vomiting.  Musculoskeletal: Negative.   Skin: Negative.   Psychiatric/Behavioral: Negative.        Past Medical History  Diagnosis Date  . Diabetes mellitus   . Hypertension   . Low back pain   . Hyperlipidemia   . CAD (coronary artery disease)   . Arthritis    Past Surgical History  Procedure Date  . Cesarean section   . Knee surgery for torn cartillige   . Carpal tunnel release both hands    . Breast lumpectomy   . Stents 2001   . Rotator cuff surgery   . Triger finger repair     reports that she quit smoking about 2 weeks ago. She does not have any smokeless tobacco history on file. She reports that she does not drink alcohol or use illicit drugs. family history includes Heart disease in her father and mother and Hypertension in her mother. Allergies not on file   NKDA  Objective:   Physical Exam  Constitutional: She is oriented to person, place, and time. She appears distressed.  HENT:  Head: Normocephalic and atraumatic.  Mouth/Throat: Oropharyngeal exudate present.  Eyes: Conjunctivae are normal. Right  eye exhibits no discharge. Left eye exhibits no discharge.  Neck: Normal range of motion. Neck supple.  Cardiovascular: Normal rate and regular rhythm.   Pulmonary/Chest: She has wheezes.  Abdominal: Bowel sounds are normal. There is tenderness.  Musculoskeletal: Normal range of motion.  Neurological: She is alert and oriented to person, place, and time.  Skin: Skin is warm and dry.          Assessment & Plan:   this is a complicated 55 year old patient with multiple medical problems today but primarily she is seen for treatment of an upper respiratory tract infection that has been going on for 3 weeks she is febrile today with a productive cough with greenish sputum therefore we'll treat her with Levaquin 500 mg by mouth daily for 7 days . Physical examination is consistent with sinusitis over bronchitis.  We reviewed her recent hemoglobin A1c which was slightly elevated I suspect that this is due to her infection we will repeat this in 3 months her lipids are at goal with a total cholesterol now that cholesterol below the recommended levels she continues Crestor.

## 2010-08-27 NOTE — Consult Note (Signed)
NEW PATIENT CONSULTATION   [name], [name]  DOB:  12-30-1955                                       09/14/2009  ZOXWR#:[id number]   The patient presents today for evaluation of extracranial  cerebrovascular occlusive disease.  She is a healthy 54-year white  female who recently was admitted to the hospital for severe dizziness.  She had an extensive workup to include carotid duplex evaluation and  this showed a moderate disease bilaterally.  She did not have symptoms  referable to anterior circulation and has had resolution.  She reports  the severe dizziness lasted for proximally 24-36 hours.  She had had one  prior episode of dizziness but was not as severe and did not last as  long.  Since her discharge from the hospital, she has had on further  difficulty.  She specifically denies any prior focal neurologic  deficits, amaurosis fugax, or transient ischemic attacks.   PAST MEDICAL HISTORY:  Significant for non-insulin diabetes beginning in  1993.  She has hypertension and elevated cholesterol.  She does have  history of coronary stents.   FAMILY HISTORY:  Positive for premature atherosclerotic disease.   SOCIAL HISTORY:  She is single with 2 children.  She works at  [employer].  She does smoke 1-1/2  packs of cigarettes per day and does not  drink alcohol.   REVIEW OF SYSTEMS:  Noted for weight loss down to 156 pounds.  She is 5  feet 3 inches tall.  CARDIAC:  As above.  PULMONARY, GI, AND GU:  Negative.  PERIPHERAL VASCULAR:  Negative.  NEUROLOGIC:  Positive for recent episode of dizziness requiring  hospitalization.  MUSCULOSKELETAL, PSYCHIATRIC, ENT, HEMATOLOGIC AND SKIN:  Negative.   PHYSICAL EXAMINATION:  General:  Well-developed, well-nourished white  female appearing stated age.  Vital signs:  Blood pressure 116/71, pulse  68, respirations 18, her temperature of 97.8.  She is in no acute  distress.  HEENT:  Normal.  Chest:  Clear  bilaterally with no rales,  rhonchi or wheezes.  Cardiovascular:  Heart rate and rhythm without  murmur.  Her carotids are without bruits bilaterally.  She has 2+ radial  and 2+ dorsalis pedis pulses bilaterally.  Abdomen:  Soft and nontender.  Musculoskeletal:  Shows no major deformities or cyanosis.  NEUROLOGIC:  No focal weakness or paresthesias.  Skin:  Without ulcers or rash.   I reviewed her carotid duplex studies and discussed them with this  patient.  This shows the lower end of the scale on the left of a 60% to  80% stenosis and on the  right 40% to 59% stenosis by velocities.  I  discussed the critical importance of smoking cessation with the patient.  I also discussed neurologic symptoms associated with carotid disease.  She will notify us immediately should this occur.   We will see her again in 6 months with repeat carotid duplex.     Larina Earthly, M.D.  Electronically Signed   TFE/MEDQ  D:  09/14/2009  T:  09/14/2009  Job:  [id number]   cc:   Stacie Glaze, MD

## 2010-08-27 NOTE — Op Note (Signed)
NAME:  Andrea [name], Andrea [name]                ACCOUNT NO.:  [id number]   MEDICAL RECORD NO.:  [id number]          PATIENT TYPE:  AMB   LOCATION:  DAY                          FACILITY:  [facility]   PHYSICIAN:  Marlowe Kays, M.D.  DATE OF BIRTH:  Mar 12, 1956   DATE OF PROCEDURE:  07/13/2007  DATE OF DISCHARGE:                               OPERATIVE REPORT   PREOPERATIVE DIAGNOSES:  1. Symptomatic left trigger thumb.  2. Chronic impingement syndrome with rotator cuff tendinopathy.  3. Degenerative arthritis acromioclavicular joint right shoulder.   POSTOPERATIVE DIAGNOSES:  1. Symptomatic left trigger thumb.  2. Chronic impingement syndrome with rotator cuff tendinopathy.  3. Degenerative arthritis acromioclavicular joint right shoulder.   OPERATION:  1. Release left trigger thumb.  2. Right shoulder arthroscopy (normal examination with arthroscopic      subacromial decompression).  3. Open distal clavicle resection right shoulder.   SURGEON:  Marlowe Kays, M.D.   ASSISTANTDruscilla Brownie. Idolina Primer, P.A.C.   ANESTHESIA:  General, Burnett Corrente, M.D.   JUSTIFICATION FOR PROCEDURE:  She had symptomatic left trigger thumb  which had failed to resolved with steroid injections, also had  chronically painful right shoulder with MRI demonstrating the above-  mentioned diagnoses.   PROCEDURE:  Satisfactory general anesthesia, prophylactic antibiotics.  We started out with an IV in the right upper extremity and a pneumatic  tourniquet on the left upper extremity. The left upper extremity  Esmarched out nonsterilely and prepped from mid forearm to fingertips,  draped in sterile field.  Time-out performed.  I made a transverse  incision in the MP flexor crease and, protecting the neurovascular  structures to either side, isolated the flexor tendon and sheath and the  A1 pulley which I opened with a 15 knife blade in the midline.  Fluid  came forth, indicating chronic irritation.  I first  released the pulley  distally until the impingement on the flexor tendons was released.  There was adherence of the pulley to the flexor tendons.  I then worked  proximally performing the same.  I also took out a little portion of the  pulley.  Passively the thumb then had nice easy excursion.  The wound  was irrigated with sterile saline.  She did not have enough subcutaneous  tissue for closure.  I closed the skin with interrupted 4-0 nylon  mattress sutures.  Betadine and Adaptic dry sterile dressing applied.  Tourniquet was released.  We then moved the IV from the right upper  extremity to the left upper extremity and she was already on the [place]  frame.  We placed her in the beach chair position and the right shoulder  girdle was prepped with a DuraPrep, draped in sterile field.  Time-out  once again performed.  We marked out the shoulder with lateral and  posterior soft spots and coracoid muscle of the proposed incision for  the distal clavicle resection.  Through a posterior soft spot portal, I  atraumatically entered the glenohumeral joint.  There was some minimal  fraying of the labrum and the biceps anchor, basically a normal  looking  joint and representative pictures were taken.  I then redirected the  scope in the subacromial space.  There was a good bit of bursitis  present through a lateral portal.  He used a blunt trocar followed by a  4.2 shaver and did my initial debridement of the joint, followed this  with a 90 degree ArthroCare vaporizer removing soft tissue from the  undersurface of the acromion.  Representative pictures of the  impingement were taken.  Then used a 4 mm oval bur to begin burring down  the subacromial area and went back and forth between the bur and the  vaporizer until we had a wide decompression as evidenced by pictures  with arm to her side and her arm abducted.  When I completed the  decompression, I then made an open incision on the distal clavicle  and  isolated the [facility] joint and then measured 1.5 cm proximal to it and after  using small crest elevator to undermine the soft tissues, placed two  baby Homan's protecting the underlying tissues while I used the micro  saw to cut the clavicle at this point.  I then removed the cut fragment  with towel clip and cutting cautery.  There were no residual fragments  of bone noted on the cut end of the clavicle which I covered with bone  wax.  Minimal bleeders were coagulated.  Wound was irrigated and Gelfoam  was placed in the resection site.  I then closed this incision with  interrupted 0 Vicryl in the fascia and periosteal layers, 4-0 Vicryl in  the superficial subcutaneous tissue and Steri-Strips on the skin, 4-0  nylon in the two portals  after I reinjected the portals and subacromial  space and the distal clavicle incision site.  Dry sterile dressing and  short immobilizer were applied.  She tolerated the procedure well and  was taken to the recovery room in satisfactory condition with no known  complications.           ______________________________  Marlowe Kays, M.D.     JA/MEDQ  D:  07/13/2007  T:  07/13/2007  Job:  [id number]

## 2010-10-04 ENCOUNTER — Ambulatory Visit: Payer: 59 | Admitting: Internal Medicine

## 2010-10-20 ENCOUNTER — Other Ambulatory Visit: Payer: Self-pay | Admitting: Internal Medicine

## 2010-10-25 ENCOUNTER — Ambulatory Visit (INDEPENDENT_AMBULATORY_CARE_PROVIDER_SITE_OTHER): Payer: 59 | Admitting: Internal Medicine

## 2010-10-25 ENCOUNTER — Encounter: Payer: Self-pay | Admitting: Internal Medicine

## 2010-10-25 DIAGNOSIS — E119 Type 2 diabetes mellitus without complications: Secondary | ICD-10-CM

## 2010-10-25 DIAGNOSIS — E785 Hyperlipidemia, unspecified: Secondary | ICD-10-CM

## 2010-10-25 DIAGNOSIS — I10 Essential (primary) hypertension: Secondary | ICD-10-CM

## 2010-10-25 LAB — LIPID PANEL
Cholesterol: 127 mg/dL (ref 0–200)
HDL: 33.1 mg/dL — ABNORMAL LOW
LDL Cholesterol: 66 mg/dL (ref 0–99)
Total CHOL/HDL Ratio: 4
Triglycerides: 138 mg/dL (ref 0.0–149.0)
VLDL: 27.6 mg/dL (ref 0.0–40.0)

## 2010-10-25 LAB — HEMOGLOBIN A1C: Hgb A1c MFr Bld: 7.3 % — ABNORMAL HIGH (ref 4.6–6.5)

## 2010-10-25 LAB — BASIC METABOLIC PANEL
BUN: 17 mg/dL (ref 6–23)
Calcium: 8.9 mg/dL (ref 8.4–10.5)
Creatinine, Ser: 0.9 mg/dL (ref 0.4–1.2)
GFR: 72.21 mL/min (ref >60.00)

## 2010-10-25 NOTE — Progress Notes (Signed)
  Subjective:    Patient ID: Andrea [name], female    DOB: [birth year], 55 y.o.   MRN: [id number]  HPI    Review of Systems     Objective:   Physical Exam        Assessment & Plan:  monitoring of A1C The pt needs to follow  Diet and exercise. Blood pressure is stable Back pain resolved Follow up in 4 months monitor lipids today

## 2010-10-25 NOTE — Progress Notes (Signed)
  Subjective:    Patient ID: Andrea [name], female    DOB: 07/28/1955, 55 y.o.   MRN: [id number]  HPI The pts CBGs are in the 80 range with rare elevations Her bronchitis has resolved   Review of Systems  Constitutional: Negative for activity change, appetite change and fatigue.  HENT: Negative for ear pain, congestion, neck pain, postnasal drip and sinus pressure.   Eyes: Negative for redness and visual disturbance.  Respiratory: Negative for cough, shortness of breath and wheezing.   Gastrointestinal: Negative for abdominal pain and abdominal distention.  Genitourinary: Negative for dysuria, frequency and menstrual problem.  Musculoskeletal: Negative for myalgias, joint swelling and arthralgias.  Skin: Negative for rash and wound.  Neurological: Negative for dizziness, weakness and headaches.  Hematological: Negative for adenopathy. Does not bruise/bleed easily.  Psychiatric/Behavioral: Negative for sleep disturbance and decreased concentration.       Past Medical History  Diagnosis Date  . Diabetes mellitus   . Hypertension   . Low back pain   . Hyperlipidemia   . CAD (coronary artery disease)   . Arthritis    Past Surgical History  Procedure Date  . Cesarean section   . Knee surgery for torn cartillige   . Carpal tunnel release both hands    . Breast lumpectomy   . Stents 2001   . Rotator cuff surgery   . Triger finger repair     reports that she quit smoking about 4 months ago. She does not have any smokeless tobacco history on file. She reports that she does not drink alcohol or use illicit drugs. family history includes Heart disease in her father and mother and Hypertension in her mother. No Known Allergies  Objective:   Physical Exam  Constitutional: She is oriented to person, place, and time. She appears well-developed and well-nourished. No distress.  HENT:  Head: Normocephalic and atraumatic.  Right Ear: External ear normal.  Left Ear: External ear  normal.  Nose: Nose normal.  Mouth/Throat: Oropharynx is clear and moist.  Eyes: Conjunctivae and EOM are normal. Pupils are equal, round, and reactive to light.  Neck: Normal range of motion. Neck supple. No JVD present. No tracheal deviation present. No thyromegaly present.  Cardiovascular: Normal rate, regular rhythm, normal heart sounds and intact distal pulses.   No murmur heard. Pulmonary/Chest: Effort normal and breath sounds normal. She has no wheezes. She exhibits no tenderness.  Abdominal: Soft. Bowel sounds are normal.  Musculoskeletal: Normal range of motion. She exhibits no edema and no tenderness.  Lymphadenopathy:    She has no cervical adenopathy.  Neurological: She is alert and oriented to person, place, and time. She has normal reflexes. No cranial nerve deficit.  Skin: Skin is warm and dry. She is not diaphoretic.  Psychiatric: She has a normal mood and affect. Her behavior is normal.          Assessment & Plan:

## 2010-11-12 IMAGING — CT CT HEAD W/O CM
1 series · 16 of 30 positions shown, 20 images · non-contrast
Comparison: None.

CLINICAL DATA: Dizziness.

CT HEAD WITHOUT CONTRAST
TECHNIQUE: Contiguous axial images were obtained from the base of
the skull through the vertex without contrast.

[Series 2: headseq 4.8 h45s · axial · 0.43mm/px · z∈[-125,+7]mm · 16 of 30 slices shown, 20 images]
[im 2/30  brain]
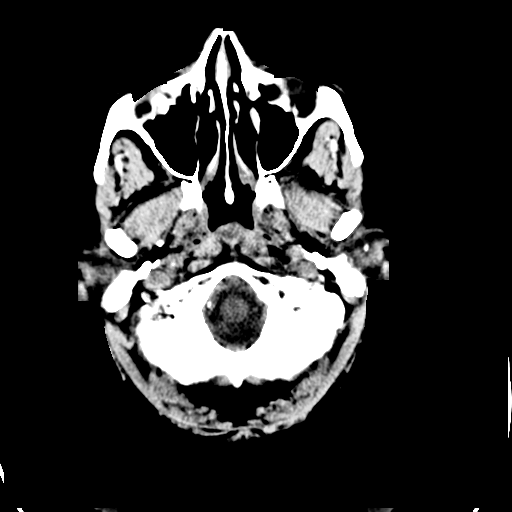
[im 2/30  bone]
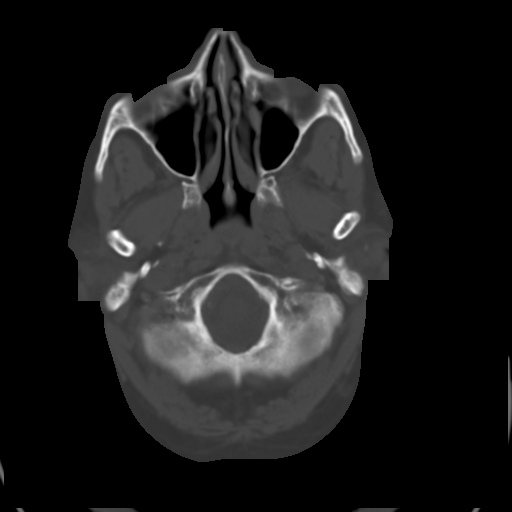
[im 4/30  brain]
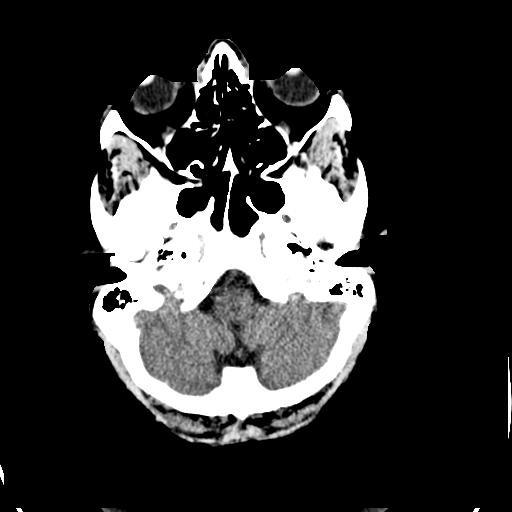
[im 6/30  brain]
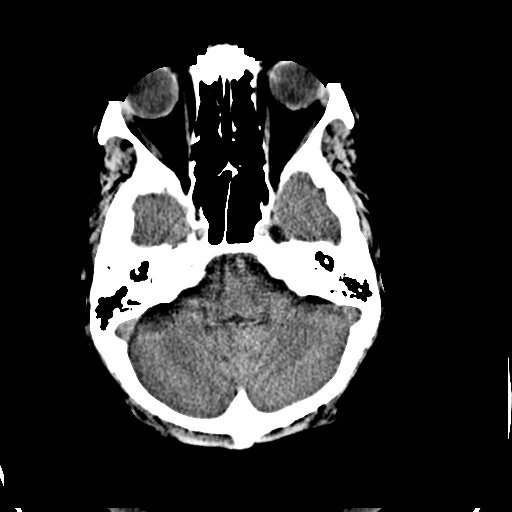
[im 8/30  brain]
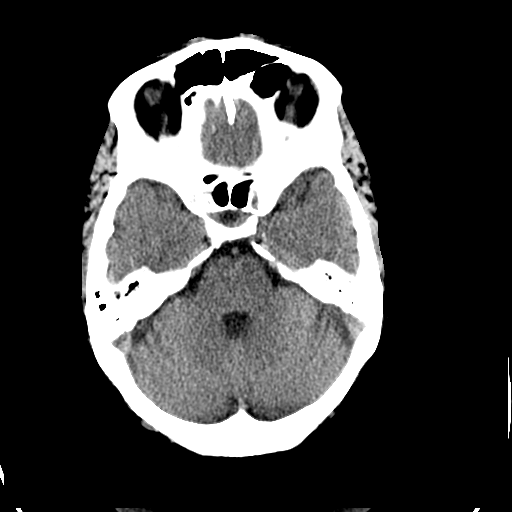
[im 9/30  brain]
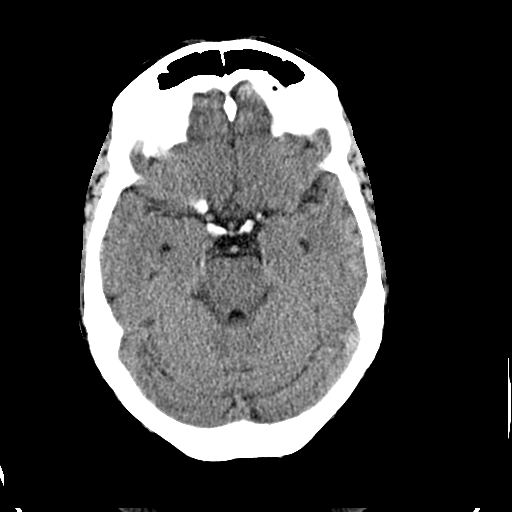
[im 9/30  bone]
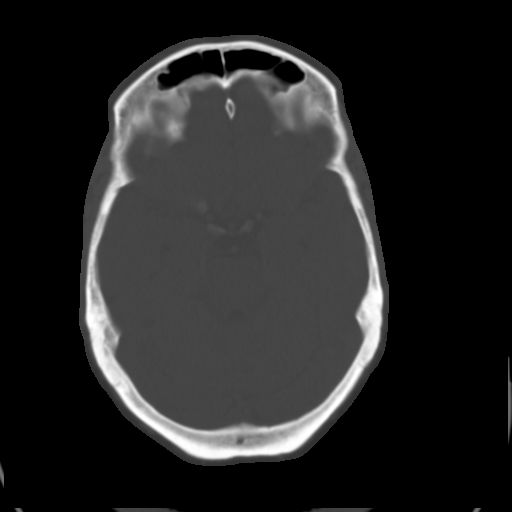
[im 11/30  brain]
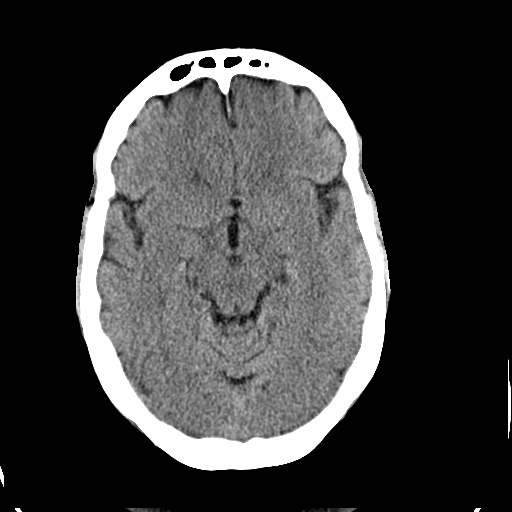
[im 13/30  brain]
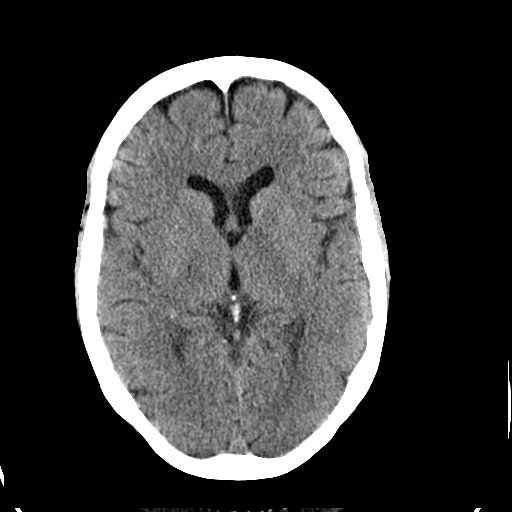
[im 15/30  brain]
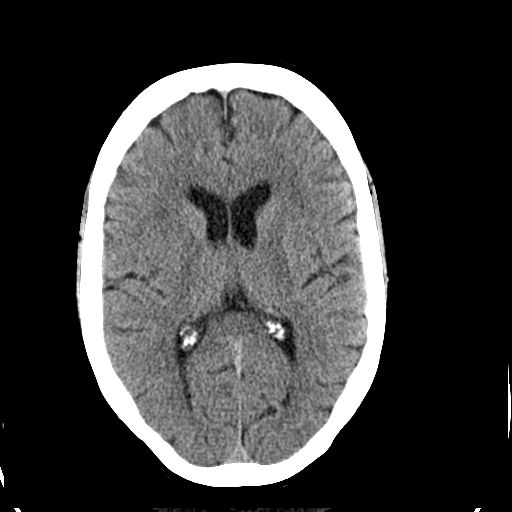
[im 16/30  brain]
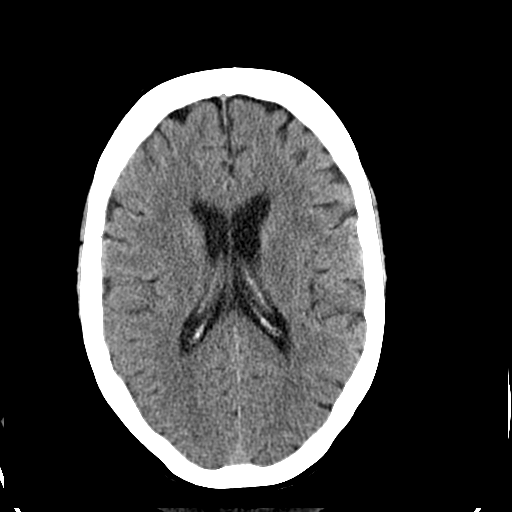
[im 16/30  bone]
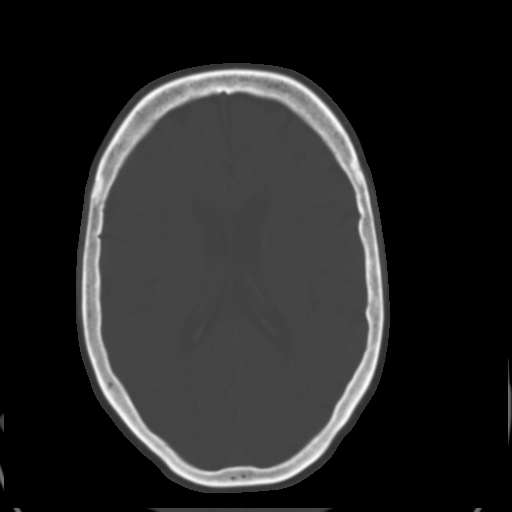
[im 18/30  brain]
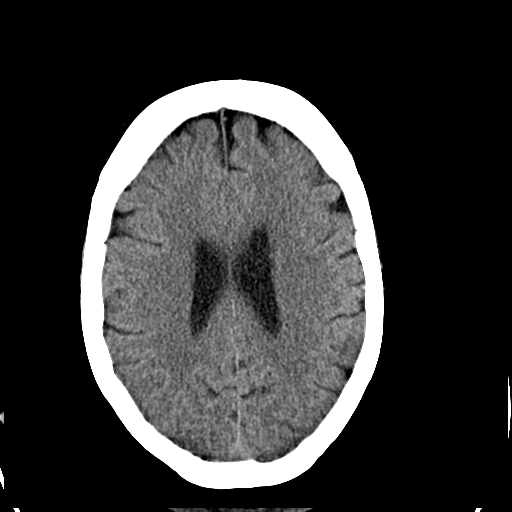
[im 20/30  brain]
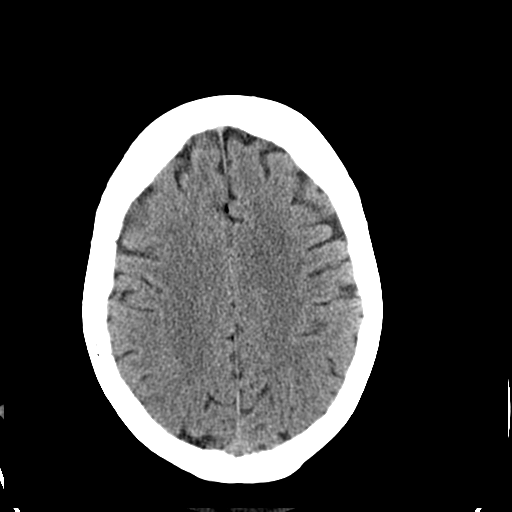
[im 22/30  brain]
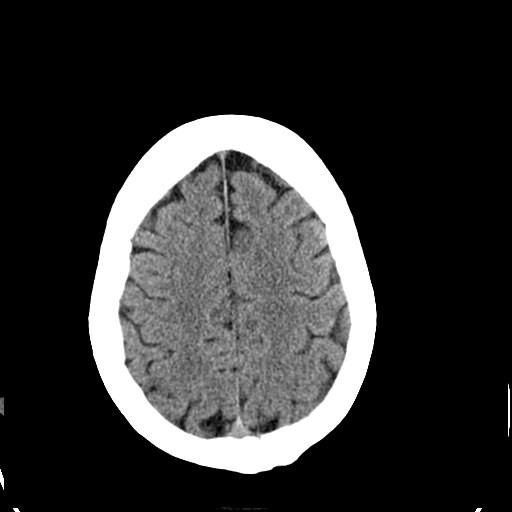
[im 23/30  brain]
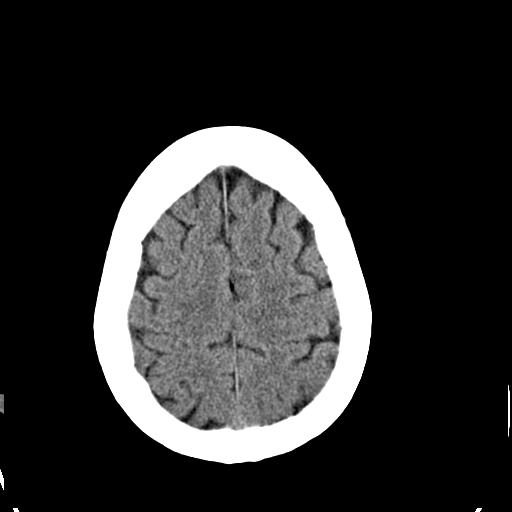
[im 23/30  bone]
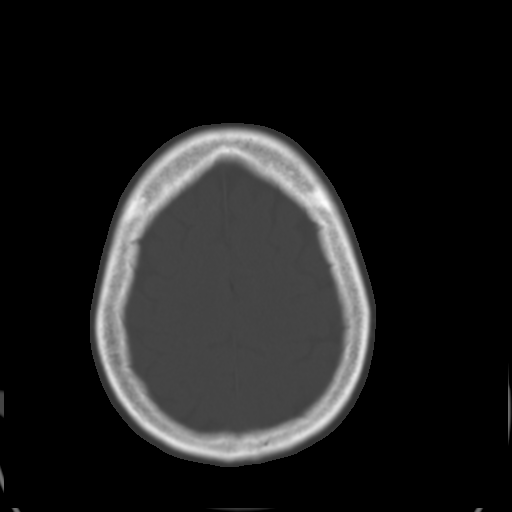
[im 25/30  brain]
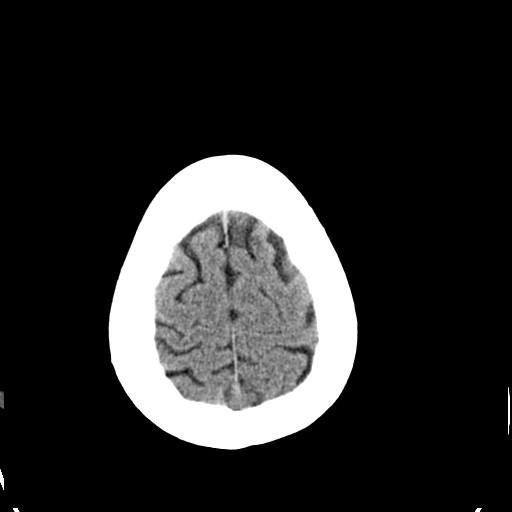
[im 27/30  brain]
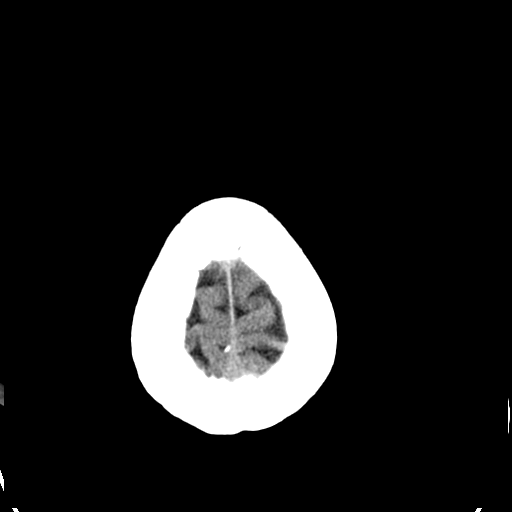
[im 29/30  brain]
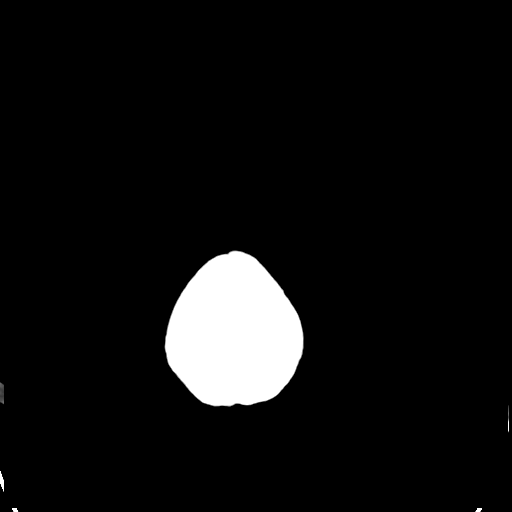

[16 of 30 positions shown; findings below may reference images not displayed]

FINDINGS: The brain appears normal without evidence of acute
infarction, hemorrhage, mass lesion, mass effect, midline shift or
abnormal extra-axial fluid collection.  There is no hydrocephalus.
Calvarium intact.  Imaged paranasal sinuses and mastoid air cells
clear.
IMPRESSION: Negative head CT.

## 2010-11-12 IMAGING — CR DG CHEST 2V
2 series · 2 of 2 positions shown · non-contrast
Comparison: PA and lateral chest 07/07/2007.

CLINICAL DATA: Dizziness.

CHEST - 2 VIEW

[w chest pa]
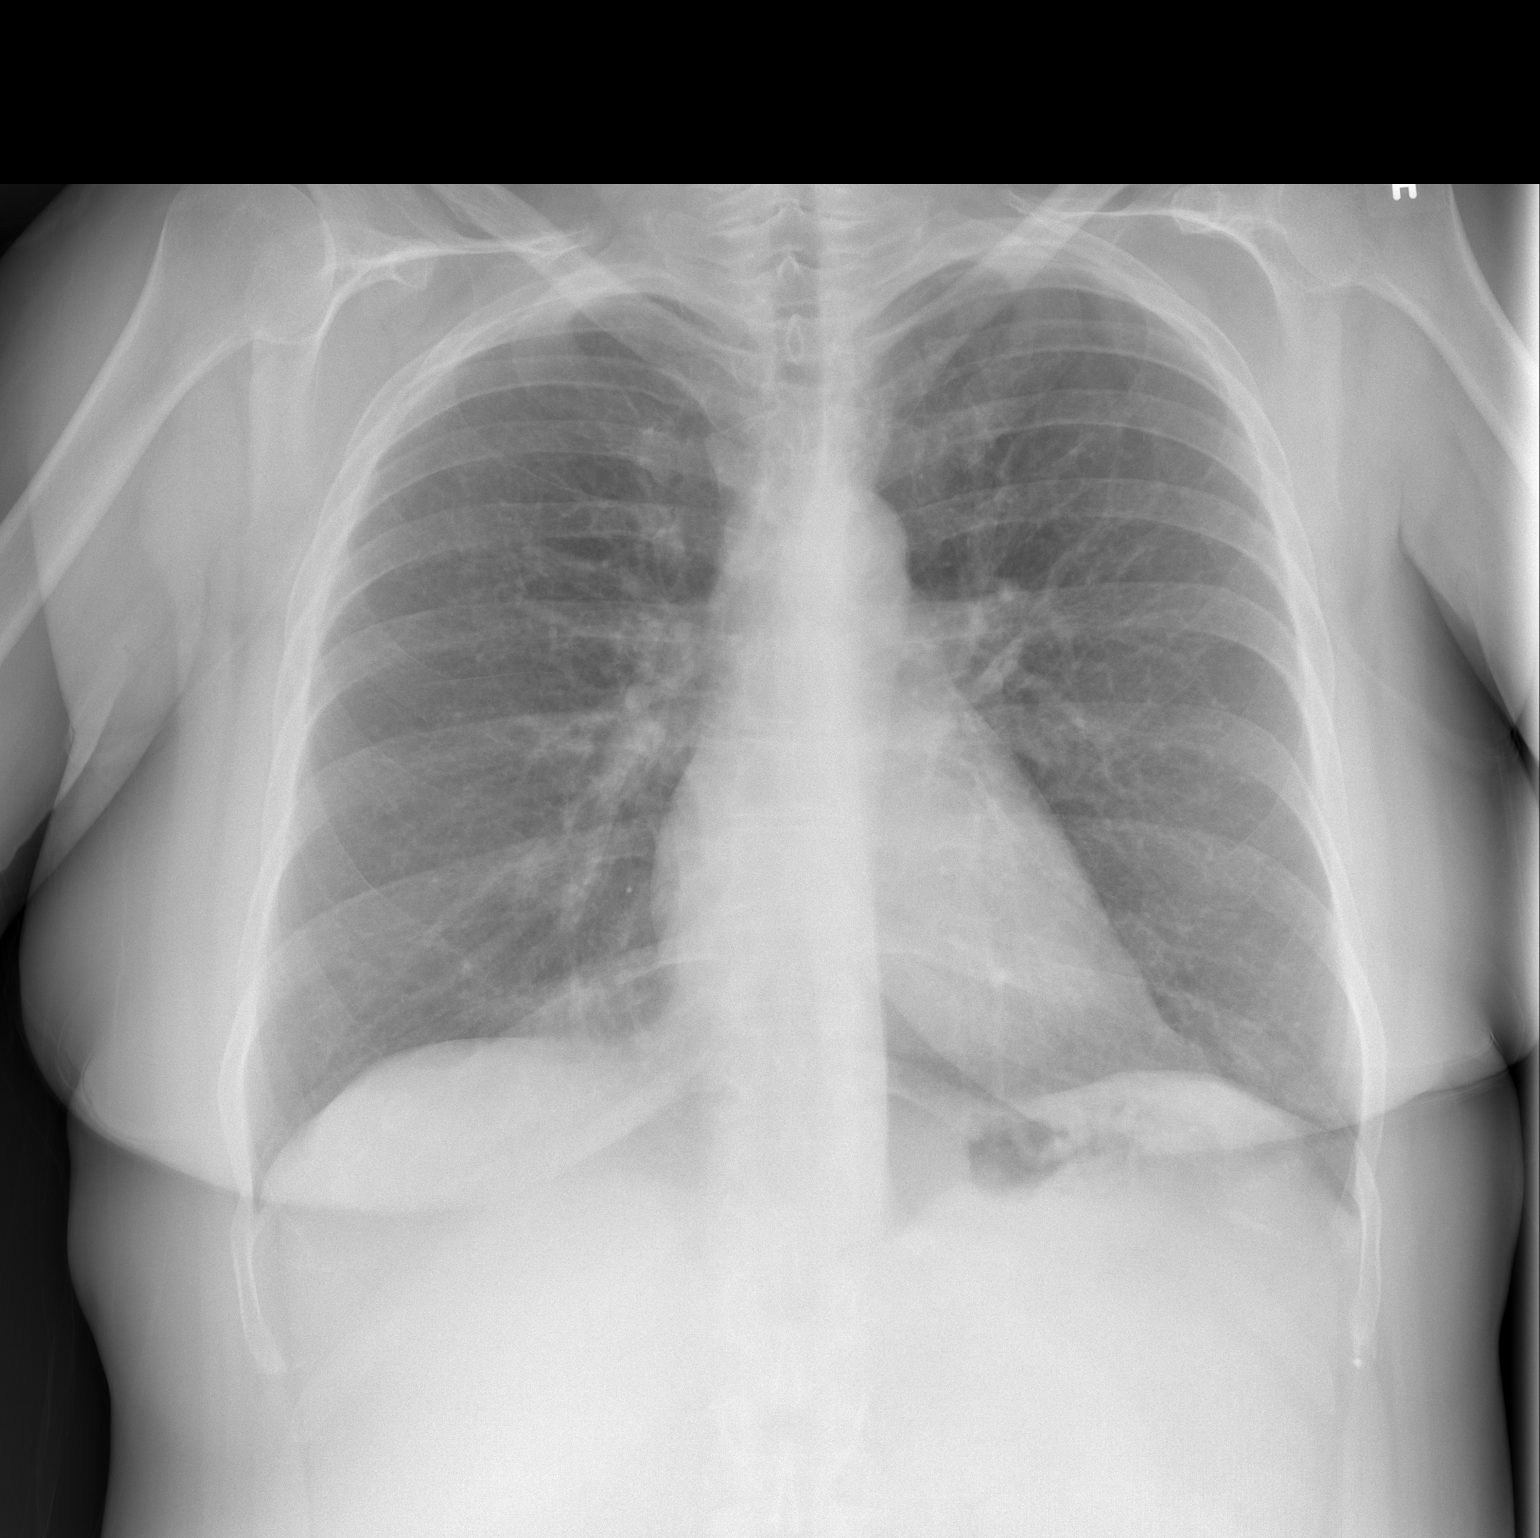

[w chest lat]
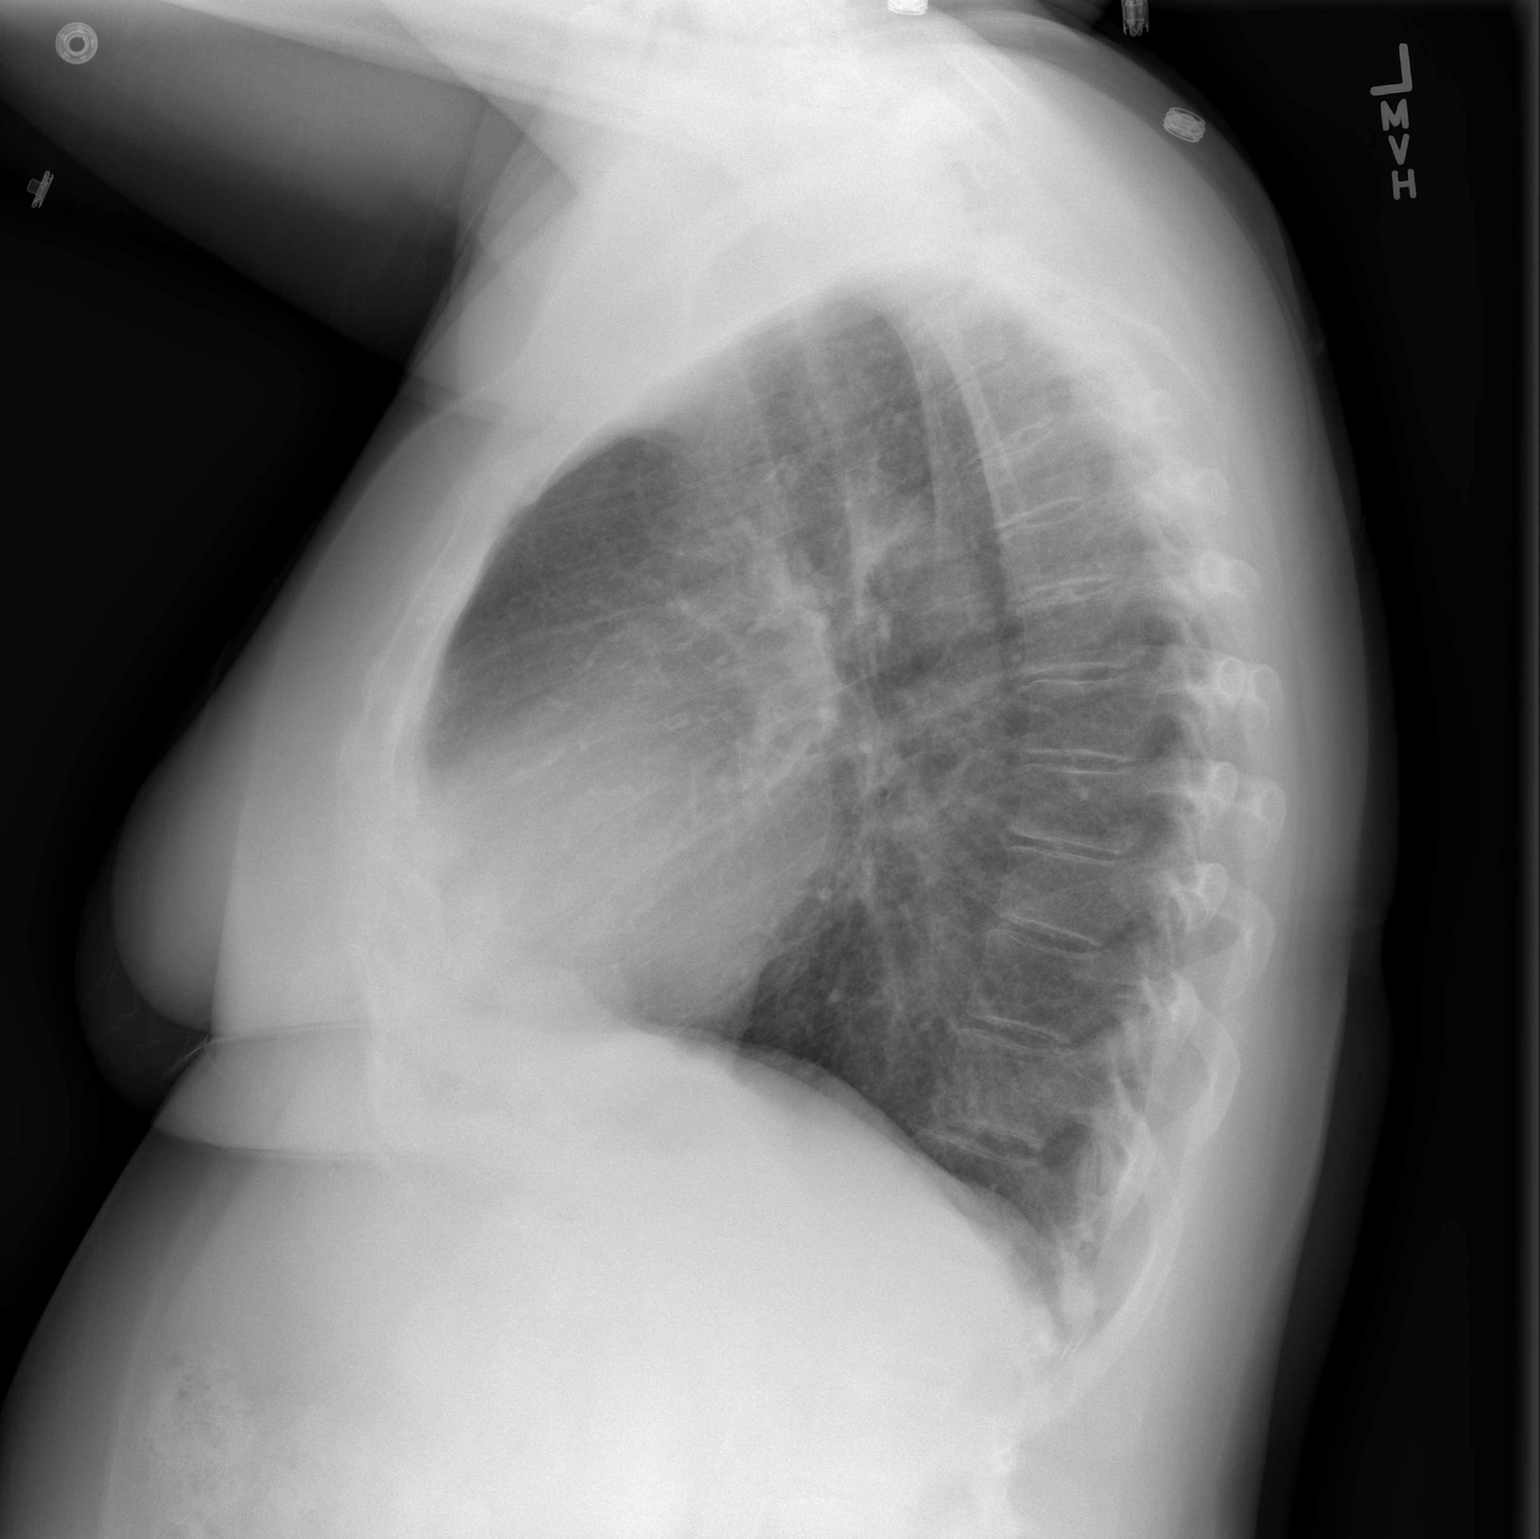

[2 of 2 positions shown; findings below may reference images not displayed]

FINDINGS: Lungs clear.  Heart size normal.  No pleural effusion.
No focal bony abnormality.
IMPRESSION: No acute disease.

## 2010-11-22 ENCOUNTER — Other Ambulatory Visit: Payer: Self-pay | Admitting: Internal Medicine

## 2010-12-09 ENCOUNTER — Other Ambulatory Visit: Payer: Self-pay | Admitting: Internal Medicine

## 2010-12-20 ENCOUNTER — Other Ambulatory Visit: Payer: Self-pay | Admitting: Internal Medicine

## 2011-01-06 ENCOUNTER — Other Ambulatory Visit: Payer: Self-pay | Admitting: Internal Medicine

## 2011-01-06 LAB — BASIC METABOLIC PANEL
CO2: 28
Chloride: 101
Creatinine, Ser: 0.59
GFR calc Af Amer: >60

## 2011-01-06 LAB — URINALYSIS, ROUTINE W REFLEX MICROSCOPIC
Glucose, UA: NEGATIVE
pH: 5.5

## 2011-01-06 LAB — HEMOGLOBIN AND HEMATOCRIT, BLOOD: Hemoglobin: 13.7

## 2011-01-22 ENCOUNTER — Encounter: Payer: Self-pay | Admitting: Internal Medicine

## 2011-01-25 ENCOUNTER — Other Ambulatory Visit: Payer: Self-pay | Admitting: Internal Medicine

## 2011-02-07 ENCOUNTER — Other Ambulatory Visit: Payer: Self-pay | Admitting: Internal Medicine

## 2011-02-28 ENCOUNTER — Ambulatory Visit (INDEPENDENT_AMBULATORY_CARE_PROVIDER_SITE_OTHER): Payer: 59 | Admitting: Internal Medicine

## 2011-02-28 ENCOUNTER — Encounter: Payer: Self-pay | Admitting: Internal Medicine

## 2011-02-28 DIAGNOSIS — I10 Essential (primary) hypertension: Secondary | ICD-10-CM

## 2011-02-28 DIAGNOSIS — E119 Type 2 diabetes mellitus without complications: Secondary | ICD-10-CM

## 2011-02-28 DIAGNOSIS — D649 Anemia, unspecified: Secondary | ICD-10-CM

## 2011-02-28 DIAGNOSIS — H00019 Hordeolum externum unspecified eye, unspecified eyelid: Secondary | ICD-10-CM

## 2011-02-28 LAB — CBC WITH DIFFERENTIAL/PLATELET
Basophils Relative: 0.8 % (ref 0.0–3.0)
Eosinophils Relative: 3 % (ref 0.0–5.0)
HCT: 35.9 % — ABNORMAL LOW (ref 36.0–46.0)
Lymphs Abs: 1.5 10*3/uL (ref 0.7–4.0)
MCV: 96.2 fl (ref 78.0–100.0)
Monocytes Absolute: 0.3 10*3/uL (ref 0.1–1.0)
RBC: 3.73 Mil/uL — ABNORMAL LOW (ref 3.87–5.11)
WBC: 6.5 10*3/uL (ref 4.5–10.5)

## 2011-02-28 LAB — BASIC METABOLIC PANEL
CO2: 26 mEq/L (ref 19–32)
Chloride: 108 mEq/L (ref 96–112)
Potassium: 4.4 mEq/L (ref 3.5–5.1)
Sodium: 144 mEq/L (ref 135–145)

## 2011-02-28 MED ORDER — LEVOFLOXACIN 500 MG PO TABS: 500.0000 mg | ORAL_TABLET | Freq: Every day | ORAL | Status: AC

## 2011-02-28 NOTE — Progress Notes (Signed)
Subjective:    Patient ID: Andrea [name], female    DOB: Apr 12, 1956, 55 y.o.   MRN: [id number]  HPI The patients blood pressure is stable and her cbgs are in the 115- 120 range Last A1C was improved and lipids were stable Due A1c today and bmet monitoring of anemia hx with CBC Bursitic pain in the hip   Review of Systems  Constitutional: Negative for activity change, appetite change and fatigue.  HENT: Positive for congestion and postnasal drip. Negative for ear pain, neck pain and sinus pressure.   Eyes: Positive for pain and redness. Negative for visual disturbance.  Respiratory: Negative for cough, shortness of breath and wheezing.   Gastrointestinal: Negative for abdominal pain and abdominal distention.  Genitourinary: Negative for dysuria, frequency and menstrual problem.  Musculoskeletal: Negative for myalgias, joint swelling and arthralgias.  Skin: Negative for rash and wound.  Neurological: Negative for dizziness, weakness and headaches.  Hematological: Negative for adenopathy. Does not bruise/bleed easily.  Psychiatric/Behavioral: Negative for sleep disturbance and decreased concentration.   Past Medical History  Diagnosis Date  . Diabetes mellitus   . Hypertension   . Low back pain   . Hyperlipidemia   . CAD (coronary artery disease)   . Arthritis     History   Social History  . Marital Status: Divorced    Spouse Name: N/A    Number of Children: N/A  . Years of Education: N/A   Occupational History  . Not on file.   Social History Main Topics  . Smoking status: Former Smoker    Quit date: 06/20/2010  . Smokeless tobacco: Not on file  . Alcohol Use: No  . Drug Use: No  . Sexually Active: Yes   Other Topics Concern  . Not on file   Social History Narrative  . No narrative on file    Past Surgical History  Procedure Date  . Cesarean section   . Knee surgery for torn cartillige   . Carpal tunnel release both hands    . Breast lumpectomy   .  Stents 2001   . Rotator cuff surgery   . Triger finger repair     Family History  Problem Relation Age of Onset  . Heart disease Mother   . Hypertension Mother   . Heart disease Father     No Known Allergies  Current Outpatient Prescriptions on File Prior to Visit  Medication Sig Dispense Refill  . ACCU-CHEK COMPACT TEST DRUM test strip USE TO TEST BLOOD SUGAR THREE TIMES DAILY  100 each  3  . aspirin 81 MG tablet Take 81 mg by mouth daily.        [name] BENICAR HCT 20-12.5 MG per tablet TAKE 1 TABLET BY MOUTH EVERY DAY  30 tablet  0  . fexofenadine-pseudoephedrine (ALLEGRA-D 24) 180-240 MG per 24 hr tablet Take 1 tablet by mouth as needed.        [name] glimepiride (AMARYL) 4 MG tablet Take 1 tablet (4 mg total) by mouth daily before breakfast.      . metFORMIN (GLUCOPHAGE) 1000 MG tablet Take 1,000 mg by mouth. One by mouth two times a day 6pm and 4 am on work days and 6 pm and in the am when you awaken awaken(7-8) on non work days       . metoprolol (LOPRESSOR) 50 MG tablet TAKE 1/2 TABLET BY MOUTH TWICE DAILY  30 tablet  0  . Multiple Vitamins-Minerals (OSTEO COMPLEX) CAPS Take by mouth daily.        [name]  ONGLYZA 5 MG TABS tablet TAKE 1 TABLET BY MOUTH DAILY  30 tablet  5  . pramoxine (SARNA SENSITIVE) 1 % LOTN Apply topically 2 (two) times daily.        . rosuvastatin (CRESTOR) 10 MG tablet Take 10 mg by mouth daily.          BP 130/70  Pulse 76  Temp 98.2 F (36.8 C)  Resp 16  Ht 5\' 3"  (1.6 m)  Wt 166 lb (75.297 kg)  BMI 29.41 kg/m2       Objective:   Physical Exam  Nursing note and vitals reviewed. Constitutional: She is oriented to person, place, and time. She appears well-developed and well-nourished. No distress.  HENT:  Head: Normocephalic and atraumatic.  Right Ear: External ear normal.  Left Ear: External ear normal.       Turbinated red and swollen Pain in leg and hip  Eyes: Conjunctivae and EOM are normal. Pupils are equal, round, and reactive to light.        Stye under left eye on lower lid  Neck: Normal range of motion. Neck supple. No JVD present. No tracheal deviation present. No thyromegaly present.  Cardiovascular: Normal rate, regular rhythm, normal heart sounds and intact distal pulses.   No murmur heard. Pulmonary/Chest: Effort normal and breath sounds normal. She has no wheezes. She exhibits no tenderness.  Abdominal: Soft. Bowel sounds are normal.  Musculoskeletal: Normal range of motion. She exhibits no edema and no tenderness.  Lymphadenopathy:    She has no cervical adenopathy.  Neurological: She is alert and oriented to person, place, and time. She has normal reflexes. No cranial nerve deficit.  Skin: Skin is warm and dry. She is not diaphoretic.  Psychiatric: She has a normal mood and affect. Her behavior is normal.          Assessment & Plan:  Hip pain... Took tylenol and resolved suggesting bursitis Increased congestion and URI symptoms Stye under left eye treat with levoquin monitor a1c today and reviewed diet

## 2011-02-28 NOTE — Progress Notes (Signed)
Addended by: Rita Ohara R on: 02/28/2011 09:21 AM   Modules accepted: Orders

## 2011-02-28 NOTE — Patient Instructions (Addendum)
The patient is instructed to continue all medications as prescribed. Schedule followup with check out clerk upon leaving the clinic  Warm compresses to eye

## 2011-03-03 ENCOUNTER — Other Ambulatory Visit: Payer: Self-pay | Admitting: Internal Medicine

## 2011-03-28 ENCOUNTER — Other Ambulatory Visit: Payer: Self-pay | Admitting: Internal Medicine

## 2011-04-10 ENCOUNTER — Other Ambulatory Visit: Payer: Self-pay | Admitting: Internal Medicine

## 2011-05-08 ENCOUNTER — Other Ambulatory Visit: Payer: Self-pay | Admitting: Internal Medicine

## 2011-05-13 ENCOUNTER — Other Ambulatory Visit: Payer: Self-pay | Admitting: Internal Medicine

## 2011-06-06 ENCOUNTER — Other Ambulatory Visit: Payer: Self-pay | Admitting: Internal Medicine

## 2011-06-17 ENCOUNTER — Other Ambulatory Visit: Payer: Self-pay | Admitting: Internal Medicine

## 2011-07-08 ENCOUNTER — Other Ambulatory Visit: Payer: Self-pay | Admitting: Internal Medicine

## 2011-07-18 ENCOUNTER — Other Ambulatory Visit: Payer: Self-pay | Admitting: Internal Medicine

## 2011-08-05 ENCOUNTER — Other Ambulatory Visit: Payer: Self-pay | Admitting: Internal Medicine

## 2011-08-08 ENCOUNTER — Telehealth: Payer: Self-pay | Admitting: Family Medicine

## 2011-08-08 MED ORDER — SITAGLIPTIN PHOSPHATE 100 MG PO TABS: 100.0000 mg | ORAL_TABLET | Freq: Every day | ORAL | Status: DC

## 2011-08-08 NOTE — Telephone Encounter (Signed)
Andrea [name], I called [employer] about a prior auth on this pt's Onglyza. No prior Berkley Harvey is available - her plan does not cover Onglyza, period. The alternatives are: Tradjenta or Januvia. Can we call in one of those instead? [facility] - [address]. Thank you.

## 2011-08-22 ENCOUNTER — Other Ambulatory Visit: Payer: Self-pay | Admitting: Internal Medicine

## 2011-08-28 ENCOUNTER — Ambulatory Visit: Payer: 59 | Admitting: Internal Medicine

## 2011-09-01 ENCOUNTER — Other Ambulatory Visit: Payer: Self-pay | Admitting: Internal Medicine

## 2011-09-21 ENCOUNTER — Other Ambulatory Visit: Payer: Self-pay | Admitting: Internal Medicine

## 2011-10-04 ENCOUNTER — Other Ambulatory Visit: Payer: Self-pay | Admitting: Internal Medicine

## 2011-10-23 ENCOUNTER — Other Ambulatory Visit: Payer: Self-pay | Admitting: Internal Medicine

## 2011-11-03 ENCOUNTER — Other Ambulatory Visit: Payer: Self-pay | Admitting: Internal Medicine

## 2011-11-25 ENCOUNTER — Other Ambulatory Visit: Payer: Self-pay | Admitting: Internal Medicine

## 2011-12-05 ENCOUNTER — Other Ambulatory Visit: Payer: Self-pay | Admitting: *Deleted

## 2011-12-05 ENCOUNTER — Other Ambulatory Visit: Payer: Self-pay | Admitting: Internal Medicine

## 2011-12-05 MED ORDER — OLMESARTAN MEDOXOMIL-HCTZ 20-12.5 MG PO TABS: 30.0000 | ORAL_TABLET | Freq: Once | ORAL | Status: DC

## 2011-12-29 ENCOUNTER — Other Ambulatory Visit: Payer: Self-pay | Admitting: Internal Medicine

## 2012-01-07 ENCOUNTER — Encounter: Payer: Self-pay | Admitting: Internal Medicine

## 2012-03-05 ENCOUNTER — Ambulatory Visit (INDEPENDENT_AMBULATORY_CARE_PROVIDER_SITE_OTHER): Payer: 59 | Admitting: Internal Medicine

## 2012-03-05 ENCOUNTER — Encounter: Payer: Self-pay | Admitting: Internal Medicine

## 2012-03-05 VITALS — BP 110/70 | HR 72 | Temp 98.0°F | Resp 16 | Ht 63.0 in | Wt 162.0 lb

## 2012-03-05 DIAGNOSIS — M1990 Unspecified osteoarthritis, unspecified site: Secondary | ICD-10-CM

## 2012-03-05 DIAGNOSIS — E119 Type 2 diabetes mellitus without complications: Secondary | ICD-10-CM

## 2012-03-05 DIAGNOSIS — E785 Hyperlipidemia, unspecified: Secondary | ICD-10-CM

## 2012-03-05 DIAGNOSIS — T887XXA Unspecified adverse effect of drug or medicament, initial encounter: Secondary | ICD-10-CM

## 2012-03-05 DIAGNOSIS — I10 Essential (primary) hypertension: Secondary | ICD-10-CM

## 2012-03-05 DIAGNOSIS — B351 Tinea unguium: Secondary | ICD-10-CM

## 2012-03-05 LAB — BASIC METABOLIC PANEL
CO2: 27 mEq/L (ref 19–32)
Calcium: 9.5 mg/dL (ref 8.4–10.5)
Chloride: 104 mEq/L (ref 96–112)
Creatinine, Ser: 0.9 mg/dL (ref 0.4–1.2)
Glucose, Bld: 94 mg/dL (ref 70–99)
Sodium: 139 mEq/L (ref 135–145)

## 2012-03-05 LAB — HEPATIC FUNCTION PANEL
ALT: 12 U/L (ref 0–35)
Albumin: 3.9 g/dL (ref 3.5–5.2)
Alkaline Phosphatase: 48 U/L (ref 39–117)
Total Protein: 7.5 g/dL (ref 6.0–8.3)

## 2012-03-05 MED ORDER — TERBINAFINE HCL 250 MG PO TABS: 250.0000 mg | ORAL_TABLET | Freq: Every day | ORAL | Status: DC

## 2012-03-05 NOTE — Progress Notes (Signed)
  Subjective:    Patient ID: Andrea [name], female    DOB: Nov 26, 1955, 56 y.o.   MRN: [id number]  HPI This is a 56 year old female who presents for followup of diabetes hypertension hyperlipidemia and for acute complaint of vasomotor rhinitis that is related to her allergic rhinitis.     Review of Systems  Constitutional: Negative for activity change, appetite change and fatigue.  HENT: Positive for congestion, rhinorrhea and postnasal drip. Negative for ear pain, neck pain and sinus pressure.   Eyes: Negative for redness and visual disturbance.  Respiratory: Negative for cough, shortness of breath and wheezing.   Gastrointestinal: Negative for abdominal pain and abdominal distention.  Genitourinary: Negative for dysuria, frequency and menstrual problem.  Musculoskeletal: Negative for myalgias, joint swelling and arthralgias.  Skin: Negative for rash and wound.  Neurological: Negative for dizziness, weakness and headaches.  Hematological: Negative for adenopathy. Does not bruise/bleed easily.  Psychiatric/Behavioral: Negative for sleep disturbance and decreased concentration.       Objective:   Physical Exam  Nursing note and vitals reviewed. Constitutional: She is oriented to person, place, and time. She appears well-developed and well-nourished. No distress.  HENT:  Head: Normocephalic and atraumatic.  Right Ear: External ear normal.  Left Ear: External ear normal.  Nose: Nose normal.  Mouth/Throat: Oropharynx is clear and moist.  Eyes: Conjunctivae normal and EOM are normal. Pupils are equal, round, and reactive to light.  Neck: Normal range of motion. Neck supple. No JVD present. No tracheal deviation present. No thyromegaly present.  Cardiovascular: Normal rate, regular rhythm, normal heart sounds and intact distal pulses.   No murmur heard. Pulmonary/Chest: Effort normal and breath sounds normal. She has no wheezes. She exhibits no tenderness.  Abdominal: Soft. Bowel  sounds are normal.  Musculoskeletal: Normal range of motion. She exhibits no edema and no tenderness.  Lymphadenopathy:    She has no cervical adenopathy.  Neurological: She is alert and oriented to person, place, and time. She has normal reflexes. No cranial nerve deficit.  Skin: Skin is warm and dry. She is not diaphoretic.       Patient has involvement of her great toe and second toe with fungal nail the white color suggest candidemia  Psychiatric: She has a normal mood and affect. Her behavior is normal.          Assessment & Plan:  Stable hypertension.  Monitor diabetes with hemoglobin A1c and basic metabolic panel.  Obtain liver function is at baseline before treating her fungal nails with Lamisil for 90 days.  Followup in 4 months

## 2012-03-10 ENCOUNTER — Telehealth: Payer: Self-pay | Admitting: *Deleted

## 2012-03-10 NOTE — Telephone Encounter (Signed)
Opened in error

## 2012-04-09 ENCOUNTER — Other Ambulatory Visit: Payer: Self-pay | Admitting: Internal Medicine

## 2012-05-08 ENCOUNTER — Other Ambulatory Visit: Payer: Self-pay | Admitting: Internal Medicine

## 2012-05-12 ENCOUNTER — Other Ambulatory Visit: Payer: Self-pay | Admitting: Internal Medicine

## 2012-05-17 ENCOUNTER — Other Ambulatory Visit: Payer: Self-pay | Admitting: Internal Medicine

## 2012-06-13 ENCOUNTER — Other Ambulatory Visit: Payer: Self-pay | Admitting: Internal Medicine

## 2012-06-14 ENCOUNTER — Telehealth: Payer: Self-pay | Admitting: Internal Medicine

## 2012-06-14 MED ORDER — GLUCOSE BLOOD VI STRP: 1.0000 | ORAL_STRIP | Freq: Three times a day (TID) | Status: DC

## 2012-06-14 NOTE — Telephone Encounter (Signed)
Patient called stating that she need a refill of her accu-check test strips testing tid sent to [facility] on [address]. Please assist.

## 2012-07-02 ENCOUNTER — Ambulatory Visit (INDEPENDENT_AMBULATORY_CARE_PROVIDER_SITE_OTHER): Payer: 59 | Admitting: Internal Medicine

## 2012-07-02 ENCOUNTER — Encounter: Payer: Self-pay | Admitting: Internal Medicine

## 2012-07-02 VITALS — BP 128/64 | HR 70 | Temp 97.8°F | Wt 160.0 lb

## 2012-07-02 DIAGNOSIS — I10 Essential (primary) hypertension: Secondary | ICD-10-CM

## 2012-07-02 DIAGNOSIS — E119 Type 2 diabetes mellitus without complications: Secondary | ICD-10-CM

## 2012-07-02 DIAGNOSIS — E785 Hyperlipidemia, unspecified: Secondary | ICD-10-CM

## 2012-07-02 DIAGNOSIS — S63509A Unspecified sprain of unspecified wrist, initial encounter: Secondary | ICD-10-CM

## 2012-07-02 DIAGNOSIS — S63501A Unspecified sprain of right wrist, initial encounter: Secondary | ICD-10-CM

## 2012-07-02 LAB — BASIC METABOLIC PANEL
BUN: 25 mg/dL — ABNORMAL HIGH (ref 6–23)
CO2: 24 mEq/L (ref 19–32)
Chloride: 101 mEq/L (ref 96–112)
Creatinine, Ser: 1.1 mg/dL (ref 0.4–1.2)
Potassium: 4.4 mEq/L (ref 3.5–5.1)

## 2012-07-02 MED ORDER — METHYLPREDNISOLONE ACETATE 40 MG/ML IJ SUSP: 40.0000 mg | Freq: Once | INTRAMUSCULAR | Status: DC

## 2012-07-02 NOTE — Progress Notes (Signed)
  Subjective:    Patient ID: Andrea [name], female    DOB: 18-Apr-1955, 57 y.o.   MRN: [id number]  HPI Is a 57 year old female who presents for followup of her hypertension and her diabetes with the setting of a history of heart disease.  She states her CBGs have been generally under 120 and that she has reasonable diabetic control.  Her blood pressure stable today her primary complaint today is osteoarthritis or arthritis of her right wrist. This is of several weeks of duration she cannot recall an acute injury she has no redness at the wrist mild swelling she notices a pulling sensation when she uses her wrist.   Review of Systems  Constitutional: Positive for fatigue. Negative for activity change and appetite change.  HENT: Negative for ear pain, congestion, neck pain, postnasal drip and sinus pressure.   Eyes: Negative for redness and visual disturbance.  Respiratory: Negative for cough, shortness of breath and wheezing.   Gastrointestinal: Negative for abdominal pain and abdominal distention.  Genitourinary: Negative for dysuria, frequency and menstrual problem.  Musculoskeletal: Positive for joint swelling and arthralgias. Negative for myalgias.       Tenderness and pain in the right wrist  Skin: Negative for rash and wound.  Neurological: Negative for dizziness, weakness and headaches.  Hematological: Negative for adenopathy. Does not bruise/bleed easily.  Psychiatric/Behavioral: Negative for sleep disturbance and decreased concentration.       Objective:   Physical Exam  Nursing note and vitals reviewed. Constitutional: She is oriented to person, place, and time. She appears well-developed and well-nourished. No distress.  HENT:  Head: Normocephalic and atraumatic.  Right Ear: External ear normal.  Left Ear: External ear normal.  Nose: Nose normal.  Mouth/Throat: Oropharynx is clear and moist.  Eyes: Conjunctivae and EOM are normal. Pupils are equal, round, and reactive to  light.  Neck: Normal range of motion. Neck supple. No JVD present. No tracheal deviation present. No thyromegaly present.  Cardiovascular: Normal rate, regular rhythm, normal heart sounds and intact distal pulses.   No murmur heard. Pulmonary/Chest: Effort normal and breath sounds normal. She has no wheezes. She exhibits no tenderness.  Abdominal: Soft. Bowel sounds are normal.  Musculoskeletal: She exhibits edema and tenderness.  Extensor tendon of the right thumb is tender both with motion and palpation  Lymphadenopathy:    She has no cervical adenopathy.  Neurological: She is alert and oriented to person, place, and time. She has normal reflexes. No cranial nerve deficit.  Skin: Skin is warm and dry. She is not diaphoretic.  Psychiatric: She has a normal mood and affect. Her behavior is normal.          Assessment & Plan:  We'll measure hemoglobin A1c and a basic metabolic panel for monitoring of her blood pressure medication side effects and her diabetic control.  Considering her wrist she will have a Depo-Medrol injection into the wrist joint and a wrist immobilization splint for approximately one week the working diagnosis is extensor tendon tendinitis of the right wrist  Informed consent obtained and the patient's right wrist was prepped with betadine. Local anesthesia was obtained with topical spray. Then 20 mg of Depo-Medrol and 1/4 cc of lidocaine was injected into the joint space. The patient tolerated the procedure without complications. Post injection care discussed with patient.

## 2012-07-11 ENCOUNTER — Other Ambulatory Visit: Payer: Self-pay | Admitting: Internal Medicine

## 2012-07-15 ENCOUNTER — Other Ambulatory Visit: Payer: Self-pay | Admitting: Internal Medicine

## 2012-08-13 ENCOUNTER — Other Ambulatory Visit: Payer: Self-pay | Admitting: Internal Medicine

## 2012-08-16 ENCOUNTER — Other Ambulatory Visit: Payer: Self-pay | Admitting: Internal Medicine

## 2012-09-02 ENCOUNTER — Telehealth: Payer: Self-pay | Admitting: Internal Medicine

## 2012-09-02 NOTE — Telephone Encounter (Signed)
Pt called in to request more samples of her Crestor 10mg . She stated that if she couldn't get samples, she needed an RX called into [facility] on [address]. Please assist.

## 2012-09-03 ENCOUNTER — Other Ambulatory Visit: Payer: Self-pay | Admitting: *Deleted

## 2012-09-03 MED ORDER — ROSUVASTATIN CALCIUM 10 MG PO TABS: 10.0000 mg | ORAL_TABLET | Freq: Every day | ORAL | Status: DC

## 2012-09-03 NOTE — Telephone Encounter (Signed)
Left message on machine with jeff that no samples are available and med called to appropriate pharmacy

## 2012-09-13 ENCOUNTER — Other Ambulatory Visit: Payer: Self-pay | Admitting: Internal Medicine

## 2012-10-06 ENCOUNTER — Encounter: Payer: Self-pay | Admitting: Internal Medicine

## 2012-10-13 ENCOUNTER — Other Ambulatory Visit: Payer: Self-pay | Admitting: Internal Medicine

## 2012-10-15 ENCOUNTER — Other Ambulatory Visit: Payer: Self-pay | Admitting: Internal Medicine

## 2012-10-21 ENCOUNTER — Other Ambulatory Visit: Payer: Self-pay | Admitting: Internal Medicine

## 2012-11-11 ENCOUNTER — Other Ambulatory Visit: Payer: Self-pay | Admitting: Internal Medicine

## 2012-11-18 ENCOUNTER — Other Ambulatory Visit: Payer: Self-pay | Admitting: Internal Medicine

## 2012-11-22 ENCOUNTER — Encounter: Payer: Self-pay | Admitting: Internal Medicine

## 2012-11-22 ENCOUNTER — Telehealth: Payer: Self-pay | Admitting: *Deleted

## 2012-11-22 ENCOUNTER — Ambulatory Visit (INDEPENDENT_AMBULATORY_CARE_PROVIDER_SITE_OTHER): Payer: 59 | Admitting: Internal Medicine

## 2012-11-22 VITALS — BP 130/72 | HR 72 | Temp 98.2°F | Resp 16 | Ht 63.0 in | Wt 158.0 lb

## 2012-11-22 DIAGNOSIS — B9689 Other specified bacterial agents as the cause of diseases classified elsewhere: Secondary | ICD-10-CM

## 2012-11-22 DIAGNOSIS — J329 Chronic sinusitis, unspecified: Secondary | ICD-10-CM

## 2012-11-22 DIAGNOSIS — I10 Essential (primary) hypertension: Secondary | ICD-10-CM

## 2012-11-22 DIAGNOSIS — A499 Bacterial infection, unspecified: Secondary | ICD-10-CM

## 2012-11-22 DIAGNOSIS — B370 Candidal stomatitis: Secondary | ICD-10-CM

## 2012-11-22 DIAGNOSIS — E119 Type 2 diabetes mellitus without complications: Secondary | ICD-10-CM

## 2012-11-22 LAB — BASIC METABOLIC PANEL
CO2: 23 mEq/L (ref 19–32)
Calcium: 9.5 mg/dL (ref 8.4–10.5)
Chloride: 102 mEq/L (ref 96–112)
Creatinine, Ser: 1.4 mg/dL — ABNORMAL HIGH (ref 0.4–1.2)
Glucose, Bld: 46 mg/dL — CL (ref 70–99)
Sodium: 138 mEq/L (ref 135–145)

## 2012-11-22 MED ORDER — AMOXICILLIN 875 MG PO TABS: 875.0000 mg | ORAL_TABLET | Freq: Two times a day (BID) | ORAL | Status: DC

## 2012-11-22 MED ORDER — NYSTATIN 100000 UNIT/ML MT SUSP: 500000.0000 [IU] | Freq: Four times a day (QID) | OROMUCOSAL | Status: DC

## 2012-11-22 NOTE — Progress Notes (Signed)
  Subjective:    Patient ID: Andrea [name], female    DOB: March 04, 1956, 57 y.o.   MRN: [id number]  HPI Reason is a 57 year old who is followed for diabetes hypertension hyperlipidemia.  She is acute an acute complaint of sinus congestion postnasal drip and moderately sore throat associated with these symptoms.  She denies any fever or chills  CBG's are variable ( she works third shift) this AM was 133   Review of Systems  Constitutional: Negative for activity change, appetite change and fatigue.  HENT: Negative for ear pain, congestion, neck pain, postnasal drip and sinus pressure.   Eyes: Negative for redness and visual disturbance.  Respiratory: Negative for cough, shortness of breath and wheezing.   Gastrointestinal: Negative for abdominal pain and abdominal distention.  Genitourinary: Negative for dysuria, frequency and menstrual problem.  Musculoskeletal: Negative for myalgias, joint swelling and arthralgias.  Skin: Negative for rash and wound.  Neurological: Negative for dizziness, weakness and headaches.  Hematological: Negative for adenopathy. Does not bruise/bleed easily.  Psychiatric/Behavioral: Negative for sleep disturbance and decreased concentration.       Objective:   Physical Exam  Constitutional: She is oriented to person, place, and time. She appears well-developed and well-nourished. No distress.  HENT:  Head: Normocephalic and atraumatic.  Swollen erythematous turbinates with mucopurulent discharge  Eyes: Conjunctivae and EOM are normal. Pupils are equal, round, and reactive to light.  Neck: Normal range of motion. Neck supple. No JVD present. No tracheal deviation present. No thyromegaly present.  Cardiovascular: Normal rate, regular rhythm, normal heart sounds and intact distal pulses.   No murmur heard. Pulmonary/Chest: Effort normal and breath sounds normal. She has no wheezes. She exhibits no tenderness.  Abdominal: Soft. Bowel sounds are normal.   Musculoskeletal: Normal range of motion. She exhibits no edema and no tenderness.  Lymphadenopathy:    She has no cervical adenopathy.  Neurological: She is alert and oriented to person, place, and time. She has normal reflexes. No cranial nerve deficit.  Skin: Skin is warm and dry. She is not diaphoretic.  Psychiatric: She has a normal mood and affect. Her behavior is normal.          Assessment & Plan:  For thrush nystatin swish and swallow.  Monitor hemoglobin A1c today.  Monitor basic metabolic panel and potassium.  4 fungal toenail use  White vinegar and warm water soaks

## 2012-11-22 NOTE — Patient Instructions (Signed)
Take one copy of white vinegar in about a gallon of warm water and soak feet for 15 minutes 2-3 times a week

## 2012-11-22 NOTE — Telephone Encounter (Signed)
Lab called and pt's blood sugar was 46--called pt and couldn't wake her up(she works thirds shift).finally woke pt up and got her to eat something

## 2012-12-06 ENCOUNTER — Other Ambulatory Visit: Payer: Self-pay | Admitting: Internal Medicine

## 2012-12-14 ENCOUNTER — Other Ambulatory Visit: Payer: Self-pay | Admitting: Internal Medicine

## 2013-01-16 ENCOUNTER — Other Ambulatory Visit: Payer: Self-pay | Admitting: Internal Medicine

## 2013-02-23 ENCOUNTER — Encounter: Payer: Self-pay | Admitting: Internal Medicine

## 2013-02-23 ENCOUNTER — Encounter: Payer: Self-pay | Admitting: Cardiology

## 2013-04-13 ENCOUNTER — Encounter: Payer: Self-pay | Admitting: Internal Medicine

## 2013-04-13 ENCOUNTER — Ambulatory Visit (INDEPENDENT_AMBULATORY_CARE_PROVIDER_SITE_OTHER): Payer: 59 | Admitting: Internal Medicine

## 2013-04-13 VITALS — BP 120/70 | HR 64 | Temp 98.2°F | Resp 16 | Ht 63.0 in | Wt 155.0 lb

## 2013-04-13 DIAGNOSIS — E119 Type 2 diabetes mellitus without complications: Secondary | ICD-10-CM

## 2013-04-13 DIAGNOSIS — I10 Essential (primary) hypertension: Secondary | ICD-10-CM

## 2013-04-13 DIAGNOSIS — E785 Hyperlipidemia, unspecified: Secondary | ICD-10-CM

## 2013-04-13 DIAGNOSIS — D649 Anemia, unspecified: Secondary | ICD-10-CM

## 2013-04-13 LAB — CBC WITH DIFFERENTIAL/PLATELET
Basophils Absolute: 0.1 10*3/uL (ref 0.0–0.1)
Eosinophils Absolute: 0.4 10*3/uL (ref 0.0–0.7)
Lymphocytes Relative: 21.3 % (ref 12.0–46.0)
Lymphs Abs: 1.8 10*3/uL (ref 0.7–4.0)
MCHC: 34 g/dL (ref 30.0–36.0)
MCV: 91.8 fl (ref 78.0–100.0)
Monocytes Absolute: 0.4 10*3/uL (ref 0.1–1.0)
Neutrophils Relative %: 68.5 % (ref 43.0–77.0)
Platelets: 267 10*3/uL (ref 150.0–400.0)
RBC: 4.04 Mil/uL (ref 3.87–5.11)
RDW: 15.3 % — ABNORMAL HIGH (ref 11.5–14.6)

## 2013-04-13 LAB — POCT URINALYSIS DIPSTICK
Bilirubin, UA: NEGATIVE
Ketones, UA: NEGATIVE
Leukocytes, UA: NEGATIVE
Nitrite, UA: NEGATIVE
Urobilinogen, UA: 0.2
pH, UA: 6

## 2013-04-13 LAB — HEPATIC FUNCTION PANEL
ALT: 10 U/L (ref 0–35)
AST: 15 U/L (ref 0–37)
Alkaline Phosphatase: 51 U/L (ref 39–117)
Bilirubin, Direct: 0 mg/dL (ref 0.0–0.3)
Total Protein: 7.4 g/dL (ref 6.0–8.3)

## 2013-04-13 LAB — LIPID PANEL
HDL: 30.9 mg/dL — ABNORMAL LOW (ref >39.00)
LDL Cholesterol: 42 mg/dL (ref 0–99)
Total CHOL/HDL Ratio: 3
Triglycerides: 155 mg/dL — ABNORMAL HIGH (ref 0.0–149.0)

## 2013-04-13 LAB — BASIC METABOLIC PANEL
CO2: 27 mEq/L (ref 19–32)
Calcium: 10 mg/dL (ref 8.4–10.5)
Chloride: 104 mEq/L (ref 96–112)
Glucose, Bld: 120 mg/dL — ABNORMAL HIGH (ref 70–99)
Potassium: 4.6 mEq/L (ref 3.5–5.1)
Sodium: 138 mEq/L (ref 135–145)

## 2013-04-13 NOTE — Patient Instructions (Signed)
Colon and mammogram needed

## 2013-04-13 NOTE — Progress Notes (Signed)
Subjective:    Patient ID: Andrea [name], female    DOB: 25-Oct-1955, 57 y.o.   MRN: [id number]  HPI Patient has been doing well on current medications she is followed for diabetes hypertension and hyperlipidemia she has a history of anemia she is here for appropriate screening blood work including a hemoglobin A1c a CBC and a basic metabolic panel today She has lost weight and her blood pressure is excellent   Review of Systems  Constitutional: Negative for activity change, appetite change and fatigue.  HENT: Negative for congestion, ear pain, postnasal drip and sinus pressure.   Eyes: Negative for redness and visual disturbance.  Respiratory: Negative for cough, chest tightness, shortness of breath and wheezing.   Gastrointestinal: Negative for abdominal pain and abdominal distention.  Genitourinary: Negative for dysuria, frequency and menstrual problem.  Musculoskeletal: Negative for arthralgias, joint swelling, myalgias and neck pain.  Skin: Negative for rash and wound.  Neurological: Negative for dizziness, weakness and headaches.  Hematological: Negative for adenopathy. Does not bruise/bleed easily.  Psychiatric/Behavioral: Negative for sleep disturbance and decreased concentration.   Past Medical History  Diagnosis Date  . Diabetes mellitus   . Hypertension   . Low back pain   . Hyperlipidemia   . CAD (coronary artery disease)   . Arthritis     History   Social History  . Marital Status: Divorced    Spouse Name: N/A    Number of Children: N/A  . Years of Education: N/A   Occupational History  . Not on file.   Social History Main Topics  . Smoking status: Former Smoker    Quit date: 06/20/2010  . Smokeless tobacco: Not on file  . Alcohol Use: No  . Drug Use: No  . Sexual Activity: Yes   Other Topics Concern  . Not on file   Social History Narrative  . No narrative on file    Past Surgical History  Procedure Laterality Date  . Cesarean section    .  Knee surgery for torn cartillige    . Carpal tunnel release both hands     . Breast lumpectomy    . Stents 2001    . Rotator cuff surgery    . Triger finger repair      Family History  Problem Relation Age of Onset  . Heart disease Mother   . Hypertension Mother   . Heart disease Father     No Known Allergies  Current Outpatient Prescriptions on File Prior to Visit  Medication Sig Dispense Refill  . aspirin 81 MG tablet Take 81 mg by mouth daily.        [name] BENICAR HCT 20-12.5 MG per tablet TAKE 1 TABLET BY MOUTH DAILY  30 tablet  11  . fexofenadine-pseudoephedrine (ALLEGRA-D 24) 180-240 MG per 24 hr tablet Take 1 tablet by mouth as needed.        [name] glimepiride (AMARYL) 4 MG tablet TAKE 1 TABLET BY MOUTH DAILY AT 6 PM  30 tablet  11  . glucose blood (ACCU-CHEK COMPACT TEST DRUM) test strip 1 each by Other route 3 (three) times daily. Use as instructed  100 each  6  . JANUVIA 100 MG tablet TAKE 1 TABLET BY MOUTH EVERY DAY  30 tablet  11  . metFORMIN (GLUCOPHAGE) 1000 MG tablet TAKE 1 TABLET BY MOUTH TWICE DAILY AT 4AM AND 6PM ON WORK DAYS, AND AT 7-8AM AND 6PM ON NON-WORK DAYS  60 tablet  11  .  metoprolol (LOPRESSOR) 50 MG tablet TAKE 1/2 TABLET BY MOUTH TWICE DAILY  90 tablet  3  . Multiple Vitamins-Minerals (OSTEO COMPLEX) CAPS Take by mouth daily.        . rosuvastatin (CRESTOR) 10 MG tablet Take 1 tablet (10 mg total) by mouth daily.  30 tablet  6   No current facility-administered medications on file prior to visit.    BP 120/70  Pulse 64  Temp(Src) 98.2 F (36.8 C)  Resp 16  Ht 5\' 3"  (1.6 m)  Wt 155 lb (70.308 kg)  BMI 27.46 kg/m2        Objective:   Physical Exam  Constitutional: She is oriented to person, place, and time. She appears well-developed and well-nourished. No distress.  HENT:  Head: Normocephalic and atraumatic.  Eyes: Conjunctivae and EOM are normal. Pupils are equal, round, and reactive to light.  Neck: Normal range of motion. Neck supple. No JVD  present. No tracheal deviation present. No thyromegaly present.  Cardiovascular: Normal rate, regular rhythm and intact distal pulses.   Murmur heard. Pulmonary/Chest: Effort normal and breath sounds normal. She has no wheezes. She exhibits no tenderness.  Abdominal: Soft. Bowel sounds are normal.  Musculoskeletal: Normal range of motion. She exhibits no edema and no tenderness.  Lymphadenopathy:    She has no cervical adenopathy.  Neurological: She is alert and oriented to person, place, and time. She has normal reflexes. No cranial nerve deficit.  Skin: Skin is warm and dry. She is not diaphoretic.  Psychiatric: She has a normal mood and affect. Her behavior is normal.          Assessment & Plan:  Monitor hemoglobin A1c CBC for history of anemia and a basic metabolic panel.  She will do her wound this examination in 6 months  She received her flu shot at work

## 2013-04-13 NOTE — Progress Notes (Signed)
Pre visit review using our clinic review tool, if applicable. No additional management support is needed unless otherwise documented below in the visit note. 

## 2013-05-19 ENCOUNTER — Encounter: Payer: Self-pay | Admitting: Family Medicine

## 2013-05-19 ENCOUNTER — Ambulatory Visit (INDEPENDENT_AMBULATORY_CARE_PROVIDER_SITE_OTHER): Payer: 59 | Admitting: Family Medicine

## 2013-05-19 VITALS — BP 144/70 | Temp 98.0°F | Wt 157.0 lb

## 2013-05-19 DIAGNOSIS — B370 Candidal stomatitis: Secondary | ICD-10-CM

## 2013-05-19 DIAGNOSIS — J069 Acute upper respiratory infection, unspecified: Secondary | ICD-10-CM

## 2013-05-19 MED ORDER — HYDROCODONE-HOMATROPINE 5-1.5 MG/5ML PO SYRP: 5.0000 mL | ORAL_SOLUTION | Freq: Three times a day (TID) | ORAL | Status: DC | PRN

## 2013-05-19 MED ORDER — NYSTATIN 100000 UNIT/ML MT SUSP: 5.0000 mL | Freq: Four times a day (QID) | OROMUCOSAL | Status: DC

## 2013-05-19 NOTE — Progress Notes (Signed)
Pre visit review using our clinic review tool, if applicable. No additional management support is needed unless otherwise documented below in the visit note. 

## 2013-05-19 NOTE — Progress Notes (Signed)
Chief Complaint  Patient presents with  . Sinusitis  . Thrush    HPI:  -started: about 4-5 days ago -symptoms:nasal congestion, sore throat, cough, clear congestion, thrush (has had this before) -denies:fever, SOB, NVD, tooth pain, body aches -has tried: musinex -sick contacts/travel/risks: denies flu exposure or Ebola risks -Hx of: allergies  ROS: See pertinent positives and negatives per HPI.  Past Medical History  Diagnosis Date  . Diabetes mellitus   . Hypertension   . Low back pain   . Hyperlipidemia   . CAD (coronary artery disease)   . Arthritis     Past Surgical History  Procedure Laterality Date  . Cesarean section    . Knee surgery for torn cartillige    . Carpal tunnel release both hands     . Breast lumpectomy    . Stents 2001    . Rotator cuff surgery    . Triger finger repair      Family History  Problem Relation Age of Onset  . Heart disease Mother   . Hypertension Mother   . Heart disease Father     History   Social History  . Marital Status: Divorced    Spouse Name: N/A    Number of Children: N/A  . Years of Education: N/A   Social History Main Topics  . Smoking status: Former Smoker    Quit date: 06/20/2010  . Smokeless tobacco: None  . Alcohol Use: No  . Drug Use: No  . Sexual Activity: Yes   Other Topics Concern  . None   Social History Narrative  . None    Current outpatient prescriptions:aspirin 81 MG tablet, Take 81 mg by mouth daily.  , Disp: , Rfl: ;  BENICAR HCT 20-12.5 MG per tablet, TAKE 1 TABLET BY MOUTH DAILY, Disp: 30 tablet, Rfl: 11;  fexofenadine-pseudoephedrine (ALLEGRA-D 24) 180-240 MG per 24 hr tablet, Take 1 tablet by mouth as needed.  , Disp: , Rfl: ;  glimepiride (AMARYL) 4 MG tablet, TAKE 1 TABLET BY MOUTH DAILY AT 6 PM, Disp: 30 tablet, Rfl: 11 glucose blood (ACCU-CHEK COMPACT TEST DRUM) test strip, 1 each by Other route 3 (three) times daily. Use as instructed, Disp: 100 each, Rfl: 6;  JANUVIA 100 MG  tablet, TAKE 1 TABLET BY MOUTH EVERY DAY, Disp: 30 tablet, Rfl: 11;  metFORMIN (GLUCOPHAGE) 1000 MG tablet, TAKE 1 TABLET BY MOUTH TWICE DAILY AT 4AM AND 6PM ON WORK DAYS, AND AT 4-0NU AND 6PM ON NON-WORK DAYS, Disp: 60 tablet, Rfl: 11 metoprolol (LOPRESSOR) 50 MG tablet, TAKE 1/2 TABLET BY MOUTH TWICE DAILY, Disp: 90 tablet, Rfl: 3;  Multiple Vitamins-Minerals (OSTEO COMPLEX) CAPS, Take by mouth daily.  , Disp: , Rfl: ;  rosuvastatin (CRESTOR) 10 MG tablet, Take 1 tablet (10 mg total) by mouth daily., Disp: 30 tablet, Rfl: 6;  HYDROcodone-homatropine (HYCODAN) 5-1.5 MG/5ML syrup, Take 5 mLs by mouth every 8 (eight) hours as needed for cough., Disp: 120 mL, Rfl: 0 nystatin (MYCOSTATIN) 100000 UNIT/ML suspension, Take 5 mLs (500,000 Units total) by mouth 4 (four) times daily., Disp: 60 mL, Rfl: 0  EXAM:  Filed Vitals:   05/19/13 0918  BP: 144/70  Temp: 98 F (36.7 C)    Body mass index is 27.82 kg/(m^2).  GENERAL: vitals reviewed and listed above, alert, oriented, appears well hydrated and in no acute distress  HEENT: atraumatic, conjunttiva clear, no obvious abnormalities on inspection of external nose and ears, normal appearance of ear canals and TMs, clear  nasal congestion, thrush on tongue, mild post oropharyngeal erythema with PND, no tonsillar edema or exudate, no sinus TTP  NECK: no obvious masses on inspection  LUNGS: clear to auscultation bilaterally, no wheezes, rales or rhonchi, good air movement  CV: HRRR, no peripheral edema  MS: moves all extremities without noticeable abnormality  PSYCH: pleasant and cooperative, no obvious depression or anxiety  ASSESSMENT AND PLAN:  Discussed the following assessment and plan:  Upper respiratory infection - Plan: HYDROcodone-homatropine (HYCODAN) 5-1.5 MG/5ML syrup  Thrush - Plan: nystatin (MYCOSTATIN) 100000 UNIT/ML suspension  -given HPI and exam findings today, a serious infection or illness is unlikely. We discussed potential  etiologies, with VURI being most likely, and advised supportive care and monitoring. We discussed treatment side effects, likely course, antibiotic misuse, transmission, and signs of developing a serious illness. -of course, we advised to return or notify a doctor immediately if symptoms worsen or persist or new concerns arise.    Patient Instructions  INSTRUCTIONS FOR UPPER RESPIRATORY INFECTION:  -plenty of rest and fluids  -nasal saline wash 2-3 times daily (use prepackaged nasal saline or bottled/distilled water if making your own)   -can use sinex nasal spray for drainage and nasal congestion - but do NOT use longer then 3-4 days  -can use tylenol or ibuprofen as directed for aches and sorethroat  -in the winter time, using a humidifier at night is helpful (please follow cleaning instructions)  -if you are taking a cough medication - use only as directed, may also try a teaspoon of honey to coat the throat and throat lozenges  -for sore throat, salt water gargles can help  -follow up if you have fevers, facial pain, tooth pain, difficulty breathing or are worsening or not getting better in 5-7 days      Andrea [name], Andrea R.

## 2013-05-19 NOTE — Patient Instructions (Signed)
INSTRUCTIONS FOR UPPER RESPIRATORY INFECTION:  -plenty of rest and fluids  -nasal saline wash 2-3 times daily (use prepackaged nasal saline or bottled/distilled water if making your own)   -can use sinex nasal spray for drainage and nasal congestion - but do NOT use longer then 3-4 days  -can use tylenol or ibuprofen as directed for aches and sorethroat  -in the winter time, using a humidifier at night is helpful (please follow cleaning instructions)  -if you are taking a cough medication - use only as directed, may also try a teaspoon of honey to coat the throat and throat lozenges  -for sore throat, salt water gargles can help  -follow up if you have fevers, facial pain, tooth pain, difficulty breathing or are worsening or not getting better in 5-7 days

## 2013-06-08 ENCOUNTER — Telehealth: Payer: Self-pay | Admitting: Internal Medicine

## 2013-06-08 ENCOUNTER — Other Ambulatory Visit: Payer: Self-pay | Admitting: *Deleted

## 2013-06-08 MED ORDER — ONETOUCH DELICA LANCETS FINE MISC: 1.0000 | Freq: Every day | Status: AC

## 2013-06-08 MED ORDER — GLUCOSE BLOOD VI STRP: ORAL_STRIP | Status: DC

## 2013-06-08 NOTE — Telephone Encounter (Signed)
done

## 2013-06-08 NOTE — Telephone Encounter (Signed)
[facility] requesting refill of the following:  ONE TOUCH ULTRA BLUE 100 TEST STRIPS ONCE TOUCH ULTRA 06Y DELICA LANCETS

## 2013-09-09 ENCOUNTER — Other Ambulatory Visit: Payer: Self-pay | Admitting: Internal Medicine

## 2013-10-14 ENCOUNTER — Other Ambulatory Visit: Payer: Self-pay | Admitting: Internal Medicine

## 2013-10-24 ENCOUNTER — Other Ambulatory Visit (INDEPENDENT_AMBULATORY_CARE_PROVIDER_SITE_OTHER): Payer: 59

## 2013-10-24 DIAGNOSIS — Z0000 Encounter for general adult medical examination without abnormal findings: Secondary | ICD-10-CM

## 2013-10-24 LAB — LIPID PANEL
CHOL/HDL RATIO: 4
Cholesterol: 108 mg/dL (ref 0–200)
HDL: 27.6 mg/dL — ABNORMAL LOW (ref >39.00)
LDL Cholesterol: 21 mg/dL (ref 0–99)
NONHDL: 80.4
Triglycerides: 295 mg/dL — ABNORMAL HIGH (ref 0.0–149.0)
VLDL: 59 mg/dL — AB (ref 0.0–40.0)

## 2013-10-24 LAB — CBC WITH DIFFERENTIAL/PLATELET
Basophils Absolute: 0.1 K/uL (ref 0.0–0.1)
Basophils Relative: 0.9 % (ref 0.0–3.0)
Eosinophils Absolute: 0.3 K/uL (ref 0.0–0.7)
Eosinophils Relative: 4.1 % (ref 0.0–5.0)
HCT: 35.8 % — ABNORMAL LOW (ref 36.0–46.0)
Hemoglobin: 12.1 g/dL (ref 12.0–15.0)
Lymphocytes Relative: 19.2 % (ref 12.0–46.0)
Lymphs Abs: 1.6 K/uL (ref 0.7–4.0)
MCHC: 33.7 g/dL (ref 30.0–36.0)
MCV: 91.8 fl (ref 78.0–100.0)
Monocytes Absolute: 0.4 K/uL (ref 0.1–1.0)
Monocytes Relative: 5.3 % (ref 3.0–12.0)
Neutro Abs: 5.8 K/uL (ref 1.4–7.7)
Neutrophils Relative %: 70.5 % (ref 43.0–77.0)
Platelets: 288 K/uL (ref 150.0–400.0)
RBC: 3.9 Mil/uL (ref 3.87–5.11)
RDW: 15.2 % (ref 11.5–15.5)
WBC: 8.2 K/uL (ref 4.0–10.5)

## 2013-10-24 LAB — MICROALBUMIN / CREATININE URINE RATIO
Creatinine,U: 64.4 mg/dL
MICROALB/CREAT RATIO: 0.3 mg/g (ref 0.0–30.0)
Microalb, Ur: 0.2 mg/dL (ref 0.0–1.9)

## 2013-10-24 LAB — POCT URINALYSIS DIPSTICK
BILIRUBIN UA: NEGATIVE
Glucose, UA: NEGATIVE
Ketones, UA: NEGATIVE
Leukocytes, UA: NEGATIVE
NITRITE UA: NEGATIVE
Protein, UA: 0.2
RBC UA: NEGATIVE
Spec Grav, UA: 1.01
Urobilinogen, UA: 0.2
pH, UA: 5.5

## 2013-10-24 LAB — HEPATIC FUNCTION PANEL
ALT: 12 U/L (ref 0–35)
AST: 18 U/L (ref 0–37)
Albumin: 3.9 g/dL (ref 3.5–5.2)
Alkaline Phosphatase: 52 U/L (ref 39–117)
Bilirubin, Direct: 0.1 mg/dL (ref 0.0–0.3)
Total Bilirubin: 0.3 mg/dL (ref 0.2–1.2)
Total Protein: 7.3 g/dL (ref 6.0–8.3)

## 2013-10-24 LAB — BASIC METABOLIC PANEL
BUN: 20 mg/dL (ref 6–23)
CO2: 25 mEq/L (ref 19–32)
CREATININE: 0.9 mg/dL (ref 0.4–1.2)
Calcium: 9.1 mg/dL (ref 8.4–10.5)
Chloride: 107 mEq/L (ref 96–112)
GFR: 72.43 mL/min (ref >60.00)
Glucose, Bld: 101 mg/dL — ABNORMAL HIGH (ref 70–99)
POTASSIUM: 4 meq/L (ref 3.5–5.1)
Sodium: 139 mEq/L (ref 135–145)

## 2013-10-24 LAB — TSH: TSH: 1.82 u[IU]/mL (ref 0.35–4.50)

## 2013-10-24 LAB — HEMOGLOBIN A1C: Hgb A1c MFr Bld: 7.7 % — ABNORMAL HIGH (ref 4.6–6.5)

## 2013-10-31 ENCOUNTER — Ambulatory Visit (INDEPENDENT_AMBULATORY_CARE_PROVIDER_SITE_OTHER): Payer: 59 | Admitting: Internal Medicine

## 2013-10-31 ENCOUNTER — Encounter: Payer: Self-pay | Admitting: Internal Medicine

## 2013-10-31 VITALS — BP 102/64 | HR 66 | Temp 98.6°F | Ht 63.0 in | Wt 156.0 lb

## 2013-10-31 DIAGNOSIS — Z0000 Encounter for general adult medical examination without abnormal findings: Secondary | ICD-10-CM

## 2013-10-31 NOTE — Progress Notes (Signed)
Subjective:    Patient ID: Andrea [name], female    DOB: 09/08/1955, 58 y.o.   MRN: [id number]  HPI CPX and wellness Follow for HTN and Lipids  And DM A1c was 7.7,  LDL 21 and  The   TG was increased and she admits poor diet pattern Blood pressure great Has had an eye exam   Due mamogram  Review of Systems  Constitutional: Negative for activity change, appetite change and fatigue.  HENT: Negative for congestion, ear pain, postnasal drip and sinus pressure.   Eyes: Negative for redness and visual disturbance.  Respiratory: Negative for cough, shortness of breath and wheezing.   Gastrointestinal: Negative for abdominal pain and abdominal distention.  Genitourinary: Negative for dysuria, frequency and menstrual problem.  Musculoskeletal: Negative for arthralgias, joint swelling, myalgias and neck pain.  Skin: Negative for rash and wound.  Neurological: Negative for dizziness, weakness and headaches.  Hematological: Negative for adenopathy. Does not bruise/bleed easily.  Psychiatric/Behavioral: Negative for sleep disturbance and decreased concentration.   Past Medical History  Diagnosis Date  . Diabetes mellitus   . Hypertension   . Low back pain   . Hyperlipidemia   . CAD (coronary artery disease)   . Arthritis     History   Social History  . Marital Status: Divorced    Spouse Name: N/A    Number of Children: N/A  . Years of Education: N/A   Occupational History  . Not on file.   Social History Main Topics  . Smoking status: Former Smoker    Quit date: 06/20/2010  . Smokeless tobacco: Not on file  . Alcohol Use: No  . Drug Use: No  . Sexual Activity: Yes   Other Topics Concern  . Not on file   Social History Narrative  . No narrative on file    Past Surgical History  Procedure Laterality Date  . Cesarean section    . Knee surgery for torn cartillige    . Carpal tunnel release both hands     . Breast lumpectomy    . Stents 2001    . Rotator cuff  surgery    . Triger finger repair      Family History  Problem Relation Age of Onset  . Heart disease Mother   . Hypertension Mother   . Heart disease Father     No Known Allergies  Current Outpatient Prescriptions on File Prior to Visit  Medication Sig Dispense Refill  . aspirin 81 MG tablet Take 81 mg by mouth daily.        [name] BENICAR HCT 20-12.5 MG per tablet TAKE 1 TABLET BY MOUTH DAILY  30 tablet  11  . CRESTOR 10 MG tablet TAKE 1 TABLET BY MOUTH DAILY  30 tablet  0  . fexofenadine-pseudoephedrine (ALLEGRA-D 24) 180-240 MG per 24 hr tablet Take 1 tablet by mouth as needed.        [name] glimepiride (AMARYL) 4 MG tablet TAKE 1 TABLET BY MOUTH DAILY AT 6 PM  30 tablet  11  . glucose blood (ACCU-CHEK COMPACT TEST DRUM) test strip 1 each by Other route 3 (three) times daily. Use as instructed  100 each  6  . glucose blood (ONE TOUCH ULTRA TEST) test strip Use as instructed  100 each  3  . HYDROcodone-homatropine (HYCODAN) 5-1.5 MG/5ML syrup Take 5 mLs by mouth every 8 (eight) hours as needed for cough.  120 mL  0  . JANUVIA 100 MG tablet  TAKE 1 TABLET BY MOUTH EVERY DAY  30 tablet  11  . metFORMIN (GLUCOPHAGE) 1000 MG tablet TAKE 1 TABLET BY MOUTH TWICE DAILY AT 4AM AND 6PM ON WORK DAYS, AND AT 7-8AM AND 6PM ON NON-WORK DAYS  60 tablet  11  . metoprolol (LOPRESSOR) 50 MG tablet TAKE 1/2 TABLET BY MOUTH TWICE DAILY  90 tablet  3  . Multiple Vitamins-Minerals (OSTEO COMPLEX) CAPS Take by mouth daily.        [name] nystatin (MYCOSTATIN) 100000 UNIT/ML suspension Take 5 mLs (500,000 Units total) by mouth 4 (four) times daily.  60 mL  0  . ONETOUCH DELICA LANCETS FINE MISC 1 Stick by Does not apply route daily.  100 each  3   No current facility-administered medications on file prior to visit.    BP 102/64  Pulse 66  Temp(Src) 98.6 F (37 C) (Oral)  Ht 5\' 3"  (1.6 m)  Wt 156 lb (70.761 kg)  BMI 27.64 kg/m2  SpO2 93%        Objective:   Physical Exam  Constitutional: She is oriented to  person, place, and time. She appears well-developed and well-nourished. No distress.  HENT:  Head: Normocephalic and atraumatic.  Eyes: Conjunctivae and EOM are normal. Pupils are equal, round, and reactive to light.  Neck: Normal range of motion. Neck supple. No JVD present. No tracheal deviation present. No thyromegaly present.  Cardiovascular: Normal rate and regular rhythm.   No murmur heard. Pulmonary/Chest: Effort normal and breath sounds normal. She has no wheezes. She exhibits no tenderness.  Abdominal: Soft. Bowel sounds are normal.  Musculoskeletal: Normal range of motion. She exhibits no edema and no tenderness.  Lymphadenopathy:    She has no cervical adenopathy.  Neurological: She is alert and oriented to person, place, and time. She has normal reflexes. No cranial nerve deficit.  Skin: Skin is warm and dry. She is not diaphoretic.  Psychiatric: She has a normal mood and affect. Her behavior is normal.          Assessment & Plan:   This is a routine wellness  examination for this patient . I reviewed all health maintenance protocols including mammography, colonoscopy, bone density Needed referrals were placed. Age and diagnosis  appropriate screening labs were ordered. Her immunization history was reviewed and appropriate vaccinations were ordered. Her current medications and allergies were reviewed and needed refills of her chronic medications were ordered. The plan for yearly health maintenance was discussed all orders and referrals were made as appropriate. Need mamogram  Orders places Up to date with DM protocol At dashboard goals and has had eye exam Patient has been not very compliant with diabetic.diet and this is evidenced by rise in A1c and awake.  Goals lose 10 pounds continue current medications LDL blood pressure and current A1c are within-per guidelines however goal for this patient is an A1c of less than 7

## 2013-10-31 NOTE — Progress Notes (Signed)
Pre visit review using our clinic review tool, if applicable. No additional management support is needed unless otherwise documented below in the visit note. 

## 2013-10-31 NOTE — Patient Instructions (Signed)
The patient is instructed to continue all medications as prescribed. Schedule followup with check out clerk upon leaving the clinic  

## 2013-11-16 ENCOUNTER — Other Ambulatory Visit: Payer: Self-pay | Admitting: Internal Medicine

## 2013-12-06 ENCOUNTER — Other Ambulatory Visit: Payer: Self-pay | Admitting: Internal Medicine

## 2014-01-03 ENCOUNTER — Other Ambulatory Visit: Payer: Self-pay | Admitting: Internal Medicine

## 2014-01-25 ENCOUNTER — Telehealth: Payer: Self-pay | Admitting: Internal Medicine

## 2014-01-25 MED ORDER — METOPROLOL TARTRATE 50 MG PO TABS: ORAL_TABLET | ORAL | Status: DC

## 2014-01-25 MED ORDER — METFORMIN HCL 1000 MG PO TABS: ORAL_TABLET | ORAL | Status: DC

## 2014-01-25 NOTE — Telephone Encounter (Signed)
Please add metFORMIN (GLUCOPHAGE) 1000 MG tablet to the below re-fill request

## 2014-01-25 NOTE — Telephone Encounter (Signed)
Rxs done and appt scheduled for next week.

## 2014-01-25 NOTE — Telephone Encounter (Signed)
Needs acute appointment this month for diabetes. If checking blood sugars please bring lab. Refill for 1 month to get to appointment. Keep new patient visit as scheduled OR if 30 minute appointment available reschedule new patient visit but I do not advise she wait until January to see me as her diabetes lab was too high on last check in [place] on review of chart.

## 2014-01-25 NOTE — Telephone Encounter (Signed)
[id number] - [place], [place] is requesting re-fill on metoprolol (LOPRESSOR) 50 MG tablet

## 2014-01-25 NOTE — Telephone Encounter (Signed)
I left a message at the pts home number to return my call. 

## 2014-01-31 ENCOUNTER — Encounter: Payer: Self-pay | Admitting: Family Medicine

## 2014-01-31 ENCOUNTER — Ambulatory Visit (INDEPENDENT_AMBULATORY_CARE_PROVIDER_SITE_OTHER): Payer: 59 | Admitting: Family Medicine

## 2014-01-31 VITALS — BP 124/72 | HR 58 | Temp 98.3°F | Ht 63.0 in | Wt 159.0 lb

## 2014-01-31 DIAGNOSIS — E1165 Type 2 diabetes mellitus with hyperglycemia: Secondary | ICD-10-CM

## 2014-01-31 DIAGNOSIS — Z23 Encounter for immunization: Secondary | ICD-10-CM

## 2014-01-31 DIAGNOSIS — IMO0002 Reserved for concepts with insufficient information to code with codable children: Secondary | ICD-10-CM

## 2014-01-31 NOTE — Patient Instructions (Signed)
You have opted to work on diet and exercise for your diabetes and recheck labs at your appointment in January  Please come fasting to your appointment in January but drink plenty of water  FOR YOUR DIABETES:  []  Eat a healthy low carb diet (avoid sweets, sweet drinks, breads, potatoes, rice, etc.) and ensure 3 small meals daily.  []  Get AT LEAST 150 minutes of cardiovascular exercise per week - 30 minutes per day is best of sustained sweaty exercise.  []  Take all of your medications every day as directed by your doctor. Call your doctor immediately if you have any questions about your medications or are running low.  []  Check your blood sugar often and when you feel unwell and keep a log to bring to all health appointments. FASTING: before you eat anything in the morning POSTPRANDIAL: 1-2 hours after a meal  []  If any low blood sugars < 70, eat a snack and call your doctor immediately.  []  See an eye doctor every year and fax your diabetic eye exam to our office.  Fax: [phone]  []  Take good care of your feet and keep them soft and callus free. Check your feet daily and wear comfortable shoes. See your doctor immediately if you have any cuts, calluses or wounds on your feet.

## 2014-01-31 NOTE — Progress Notes (Signed)
Pre visit review using our clinic review tool, if applicable. No additional management support is needed unless otherwise documented below in the visit note. 

## 2014-01-31 NOTE — Progress Notes (Signed)
No chief complaint on file.   HPI:  Acute visit for:  Diabetes: -Note: prior pt of Dr. Adline Mango- new patient visit with me scheduled in January -hgba1c 7.7 last check in July - she has been working on diet -home blood sugars - ave 30 days 101 -Meds: metformin 1000bid, Januvia 100mg  daily, amaryl 4mg  daily, asa, benicar -reports takes all medication every day -no exercises -diet is not great - only eats two meals per day  -hx of CAD (sees cardiologist), HLD, HTN  ROS: See pertinent positives and negatives per HPI.  Past Medical History  Diagnosis Date  . Diabetes mellitus   . Hypertension   . Low back pain   . Hyperlipidemia   . CAD (coronary artery disease)   . Arthritis     Past Surgical History  Procedure Laterality Date  . Cesarean section    . Knee surgery for torn cartillige    . Carpal tunnel release both hands     . Breast lumpectomy    . Stents 2001    . Rotator cuff surgery    . Triger finger repair      Family History  Problem Relation Age of Onset  . Heart disease Mother   . Hypertension Mother   . Heart disease Father     History   Social History  . Marital Status: Divorced    Spouse Name: N/A    Number of Children: N/A  . Years of Education: N/A   Social History Main Topics  . Smoking status: Former Smoker    Quit date: 06/20/2010  . Smokeless tobacco: None  . Alcohol Use: No  . Drug Use: No  . Sexual Activity: Yes   Other Topics Concern  . None   Social History Narrative  . None    Current outpatient prescriptions:aspirin 81 MG tablet, Take 81 mg by mouth daily.  , Disp: , Rfl: ;  BENICAR HCT 20-12.5 MG per tablet, TAKE 1 TABLET BY MOUTH DAILY, Disp: 30 tablet, Rfl: 4;  CRESTOR 10 MG tablet, TAKE 1 TABLET BY MOUTH EVERY DAY, Disp: 90 tablet, Rfl: 3;  fexofenadine-pseudoephedrine (ALLEGRA-D 24) 180-240 MG per 24 hr tablet, Take 1 tablet by mouth as needed.  , Disp: , Rfl:  glimepiride (AMARYL) 4 MG tablet, TAKE 1 TABLET BY MOUTH  EVERY DAY AT 6 PM, Disp: 30 tablet, Rfl: 10;  glucose blood (ONE TOUCH ULTRA TEST) test strip, Use as instructed, Disp: 100 each, Rfl: 3;  HYDROcodone-homatropine (HYCODAN) 5-1.5 MG/5ML syrup, Take 5 mLs by mouth every 8 (eight) hours as needed for cough., Disp: 120 mL, Rfl: 0;  JANUVIA 100 MG tablet, TAKE 1 TABLET BY MOUTH EVERY DAY, Disp: 90 tablet, Rfl: 3 metFORMIN (GLUCOPHAGE) 1000 MG tablet, Take 1 tablet by mouth twice daily at 4am and 6pm on work days, and at 7-8 am and 6pm on non-work days, Disp: 60 tablet, Rfl: 0;  metoprolol (LOPRESSOR) 50 MG tablet, Take 1/2 tablet by mouth twice a daily, Disp: 30 tablet, Rfl: 0;  Multiple Vitamins-Minerals (OSTEO COMPLEX) CAPS, Take by mouth daily.  , Disp: , Rfl:  ONETOUCH DELICA LANCETS FINE MISC, 1 Stick by Does not apply route daily., Disp: 100 each, Rfl: 3  EXAM:  Filed Vitals:   01/31/14 0941  BP: 124/72  Pulse: 58  Temp: 98.3 F (36.8 C)    Body mass index is 28.17 kg/(m^2).  GENERAL: vitals reviewed and listed above, alert, oriented, appears well hydrated and in no acute distress  HEENT: atraumatic, conjunttiva clear, no obvious abnormalities on inspection of external nose and ears  NECK: no obvious masses on inspection  LUNGS: clear to auscultation bilaterally, no wheezes, rales or rhonchi, good air movement  CV: HRRR, no peripheral edema  MS: moves all extremities without noticeable abnormality  PSYCH: pleasant and cooperative, no obvious depression or anxiety  ASSESSMENT AND PLAN:  Discussed the following assessment and plan:  Diabetes type 2, uncontrolled  -discussed implications, treatment options, discussed other medication and insulin, lifestyle recs - home BSs have been better -she opted to continue work on diet and exercise and recheck labs in January -Patient advised to return or notify a doctor immediately if symptoms worsen or persist or new concerns arise.  Patient Instructions  You have opted to work on diet  and exercise for your diabetes and recheck labs at your appointment in January  Please come fasting to your appointment in January but drink plenty of water  FOR YOUR DIABETES:  []  Eat a healthy low carb diet (avoid sweets, sweet drinks, breads, potatoes, rice, etc.) and ensure 3 small meals daily.  []  Get AT LEAST 150 minutes of cardiovascular exercise per week - 30 minutes per day is best of sustained sweaty exercise.  []  Take all of your medications every day as directed by your doctor. Call your doctor immediately if you have any questions about your medications or are running low.  []  Check your blood sugar often and when you feel unwell and keep a log to bring to all health appointments. FASTING: before you eat anything in the morning POSTPRANDIAL: 1-2 hours after a meal  []  If any low blood sugars < 70, eat a snack and call your doctor immediately.  []  See an eye doctor every year and fax your diabetic eye exam to our office.  Fax: [phone]  []  Take good care of your feet and keep them soft and callus free. Check your feet daily and wear comfortable shoes. See your doctor immediately if you have any cuts, calluses or wounds on your feet.      Colin Benton R.

## 2014-01-31 NOTE — Addendum Note (Signed)
Addended by: Agnes Lawrence on: 01/31/2014 10:13 AM   Modules accepted: Orders

## 2014-02-16 ENCOUNTER — Telehealth: Payer: Self-pay | Admitting: Internal Medicine

## 2014-02-16 NOTE — Telephone Encounter (Signed)
Would need appt. Can try OTC delsum or musinex

## 2014-02-16 NOTE — Telephone Encounter (Signed)
Patient informed and declines an appt-states she will try Delsym or Mucinex.

## 2014-02-16 NOTE — Telephone Encounter (Signed)
Pt is having laser eye surdery next wed and has a sinus issue going on. Pt has congestoin, would like to know if dr kim wil rx her  HYDROcodone-homatropine (HYCODAN) 5-1.5 MG/5ML syrup  Pt refused appt.

## 2014-02-23 ENCOUNTER — Other Ambulatory Visit: Payer: Self-pay | Admitting: Family Medicine

## 2014-03-25 ENCOUNTER — Other Ambulatory Visit: Payer: Self-pay | Admitting: Family Medicine

## 2014-04-25 ENCOUNTER — Other Ambulatory Visit: Payer: Self-pay | Admitting: Family Medicine

## 2014-05-03 ENCOUNTER — Encounter: Payer: Self-pay | Admitting: Family Medicine

## 2014-05-03 ENCOUNTER — Ambulatory Visit (INDEPENDENT_AMBULATORY_CARE_PROVIDER_SITE_OTHER): Payer: 59 | Admitting: Family Medicine

## 2014-05-03 VITALS — BP 100/60 | HR 65 | Temp 98.2°F | Ht 63.0 in | Wt 156.5 lb

## 2014-05-03 DIAGNOSIS — Z7189 Other specified counseling: Secondary | ICD-10-CM

## 2014-05-03 DIAGNOSIS — Z7689 Persons encountering health services in other specified circumstances: Secondary | ICD-10-CM

## 2014-05-03 DIAGNOSIS — E1165 Type 2 diabetes mellitus with hyperglycemia: Secondary | ICD-10-CM

## 2014-05-03 DIAGNOSIS — E785 Hyperlipidemia, unspecified: Secondary | ICD-10-CM

## 2014-05-03 DIAGNOSIS — IMO0002 Reserved for concepts with insufficient information to code with codable children: Secondary | ICD-10-CM

## 2014-05-03 DIAGNOSIS — I2510 Atherosclerotic heart disease of native coronary artery without angina pectoris: Secondary | ICD-10-CM

## 2014-05-03 DIAGNOSIS — I10 Essential (primary) hypertension: Secondary | ICD-10-CM

## 2014-05-03 LAB — BASIC METABOLIC PANEL
BUN: 22 mg/dL (ref 6–23)
CO2: 26 mEq/L (ref 19–32)
Calcium: 9.9 mg/dL (ref 8.4–10.5)
Chloride: 104 mEq/L (ref 96–112)
Creatinine, Ser: 1.03 mg/dL (ref 0.40–1.20)
GFR: 57.93 mL/min — AB (ref >60.00)
GLUCOSE: 128 mg/dL — AB (ref 70–99)
Potassium: 4 mEq/L (ref 3.5–5.1)
SODIUM: 138 meq/L (ref 135–145)

## 2014-05-03 LAB — HEMOGLOBIN A1C: Hgb A1c MFr Bld: 7.6 % — ABNORMAL HIGH (ref 4.6–6.5)

## 2014-05-03 NOTE — Progress Notes (Signed)
Pre visit review using our clinic review tool, if applicable. No additional management support is needed unless otherwise documented below in the visit note. 

## 2014-05-03 NOTE — Patient Instructions (Addendum)
BEFORE YOU LEAVE: -labs -schedule follow up in 3 months, physical with pap in 10/2014  Check on cost of shingles vaccine  Call today to schedule your mammogram  Call GI office per letter printed for you to see when you need your colonoscopy

## 2014-05-03 NOTE — Progress Notes (Signed)
HPI:  Andrea [name] is here to establish care.  Last PCP and physical: wants to do CPE with pap with me, used to see gyn per her report, reports all normal pap smears in the past  Has the following chronic problems that require follow up and concerns today:  DM: -complications: a little neuropathy per her report -meds: metformin 1000mg  bid; Januvia 100mg  daily, glimepiride 4 daily, asa 81mg  daily, arb -diet and exercise: no regular CV exercise; diet so so - eats a lot of bread -denies: polyuria, polydipsia, vision loss, wounds on her feet, hypoglycemia  HLD: -meds: crestor -denies: leg cramps or cog decline  Hx CAD, HTN: -benicar 20-12.5, metoprolol 25mg  bid, asa 81mg  daily, statin -denies: CP, sob, DOE, swelling, palpitations  Hx Seasonal Allergies: -uses allegra d prn  Hx of Smoking: -> 30 pack year -discussed and offered lung cancer screening - declined  ROS negative for unless reported above: fevers, unintentional weight loss, hearing or vision loss, chest pain, palpitations, struggling to breath, hemoptysis, melena, hematochezia, hematuria, falls, loc, si, thoughts of self harm  Past Medical History  Diagnosis Date  . Diabetes mellitus   . Hypertension   . Low back pain   . Hyperlipidemia   . CAD (coronary artery disease)   . Arthritis   . Anemia     Past Surgical History  Procedure Laterality Date  . Cesarean section    . Knee surgery for torn cartillige    . Carpal tunnel release both hands     . Breast lumpectomy    . Stents 2001    . Rotator cuff surgery    . Triger finger repair      Family History  Problem Relation Age of Onset  . Heart disease Mother   . Hypertension Mother   . Heart disease Father     History   Social History  . Marital Status: Divorced    Spouse Name: N/A    Number of Children: N/A  . Years of Education: N/A   Social History Main Topics  . Smoking status: Former Smoker    Quit date: 06/20/2010  . Smokeless  tobacco: None     Comment: smoked 1ppd for> 30 years  . Alcohol Use: No  . Drug Use: No  . Sexual Activity: Yes   Other Topics Concern  . 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  aspirin 81 MG tablet, Take 81 mg by mouth daily.  , Disp: , Rfl:  .  BENICAR HCT 20-12.5 MG per tablet, TAKE 1 TABLET BY MOUTH DAILY, Disp: 30 tablet, Rfl: 4 .  CRESTOR 10 MG tablet, TAKE 1 TABLET BY MOUTH EVERY DAY, Disp: 90 tablet, Rfl: 3 .  glimepiride (AMARYL) 4 MG tablet, TAKE 1 TABLET BY MOUTH EVERY DAY AT 6 PM, Disp: 30 tablet, Rfl: 10 .  glucose blood (ONE TOUCH ULTRA TEST) test strip, Use as instructed, Disp: 100 each, Rfl: 3 .  JANUVIA 100 MG tablet, TAKE 1 TABLET BY MOUTH EVERY DAY, Disp: 90 tablet, Rfl: 3 .  metFORMIN (GLUCOPHAGE) 1000 MG tablet, TAKE 1 TABLET BY MOUTH TWICE DAILY AT 4AM AND 6PM ON WORK DAYS, AND  AT 7-8AM AND 6PM ON NON-WORK DAYS, Disp: 60 tablet, Rfl: 0 .  metoprolol (LOPRESSOR) 50 MG tablet, TAKE 1/2 TABLET BY MOUTH TWICE DAILY, Disp: 30 tablet, Rfl: 0 .  Multiple Vitamins-Minerals (OSTEO COMPLEX) CAPS, Take by mouth daily.  , Disp: , Rfl:  .  ONETOUCH DELICA LANCETS FINE MISC, 1 Stick by Does not apply route daily., Disp: 100 each, Rfl: 3  EXAM:  Filed Vitals:   05/03/14 0819  BP: 100/60  Pulse: 65  Temp: 98.2 F (36.8 C)    Body mass index is 27.73 kg/(m^2).  GENERAL: vitals reviewed and listed above, alert, oriented, appears well hydrated and in no acute distress  HEENT: atraumatic, conjunttiva clear, no obvious abnormalities on inspection of external nose and ears  NECK: no obvious masses on inspection  LUNGS: clear to auscultation bilaterally, no wheezes, rales or rhonchi, good air movement  CV: HRRR, no peripheral edema  MS: moves all  extremities without noticeable abnormality  PSYCH: pleasant and cooperative, no obvious depression or anxiety  ASSESSMENT AND PLAN:  Discussed the following assessment and plan:  Diabetes type 2, uncontrolled - Plan: Hemoglobin A1c -labs today, not fasting and lipids ok last check so hold on lipids today and continue statin  Hyperlipemia -cont statin  Essential hypertension - Plan: Basic metabolic panel -stable, cont current tx  Atherosclerosis of native coronary artery of native heart without angina pectoris -stable  Encounter to establish care -We reviewed the PMH, PSH, FH, SH, Meds and Allergies. -We provided refills for any medications we will prescribe as needed. -We addressed current concerns per orders and patient instructions. -We have asked for records for pertinent exams, studies, vaccines and notes from previous providers. -We have advised patient to follow up per instructions below.  HM: -advised to schedule mammo and number given to call -advised lung cancer screening - declined -advised pap - she wants to get at physical -advised shingles vaccine - she will check on cost  -Patient advised to return or notify a doctor immediately if symptoms worsen or persist or new concerns arise.  Patient Instructions  BEFORE YOU LEAVE: -labs -schedule follow up in 3 months, physical with pap in 10/2014  Check on cost of shingles vaccine  Call today to schedule your mammogram  Call GI office per letter printed for you to see when you need your colonoscopy     Lucretia Kern.

## 2014-05-04 ENCOUNTER — Telehealth: Payer: Self-pay | Admitting: Family Medicine

## 2014-05-04 NOTE — Telephone Encounter (Signed)
emmi mailed  °

## 2014-05-04 NOTE — Addendum Note (Signed)
Addended by: Agnes Lawrence on: 05/04/2014 02:29 PM   Modules accepted: Orders

## 2014-05-12 ENCOUNTER — Telehealth: Payer: Self-pay | Admitting: Family Medicine

## 2014-05-12 MED ORDER — GLUCOSE BLOOD VI STRP: ORAL_STRIP | Status: DC

## 2014-05-12 NOTE — Telephone Encounter (Signed)
Pt has been out of test strips for days and her shipment will not come in from her mail order pharmacy until Monday. She would like and order for some called to [facility] on [facility] for the one touch ultra blue.

## 2014-05-12 NOTE — Telephone Encounter (Signed)
Rx done. 

## 2014-05-17 ENCOUNTER — Encounter: Payer: Self-pay | Admitting: Internal Medicine

## 2014-05-25 ENCOUNTER — Encounter: Payer: 59 | Attending: Family Medicine | Admitting: Dietician

## 2014-05-25 ENCOUNTER — Encounter: Payer: Self-pay | Admitting: Dietician

## 2014-05-25 ENCOUNTER — Other Ambulatory Visit: Payer: Self-pay | Admitting: Family Medicine

## 2014-05-25 VITALS — Ht 63.0 in | Wt 153.0 lb

## 2014-05-25 DIAGNOSIS — E1165 Type 2 diabetes mellitus with hyperglycemia: Secondary | ICD-10-CM | POA: Insufficient documentation

## 2014-05-25 DIAGNOSIS — IMO0002 Reserved for concepts with insufficient information to code with codable children: Secondary | ICD-10-CM

## 2014-05-25 DIAGNOSIS — Z713 Dietary counseling and surveillance: Secondary | ICD-10-CM | POA: Diagnosis not present

## 2014-05-25 NOTE — Patient Instructions (Signed)
Plan:  Aim for 3 Carb Choices per meal (45 grams) +/- 1 either way  Aim for 0-2 Carbs per snack if hungry  Include protein in moderation with your meals and snacks.  Consider trying hummus with veges. Consider reading food labels for Total Carbohydrate and Fat Grams of foods Be as active as you can.  Walking is great! Consider checking BG at alternate times per day as directed by MD  Consider taking medication as directed by MD

## 2014-05-25 NOTE — Progress Notes (Signed)
Medical Nutrition Therapy:  Appt start time: 0830 end time:  0930.   Assessment:  Primary concerns today: Patient wants to learn better things to eat to improve blood sugar control. States that she has given up regular soda since making this appointment 3 weeks ago.  Her HgbA1C was 7.0%  04/15/14.  She checks her blood sugar 4 times per day:  When she gets up to the bathroom, when she wakes, 2 hours after 2 meals and before work.  She has been dropping to 61 recently while asleep despite eating something before going to bed.  Usual CBG <120.  Weight has been stable for the past 2 years and had decreased prior to that time.  BMI is currently 27.2.  Hx of GDM and type 2 DM for the past 25 years.  She works 3rd shift at [employer].  Youngest son and his girlfriend currently live with patient.  Patient states that she mostly goes out to eat and does not cook.   Smokes vapor cigarettes occasionally.  No longer smokes regular cigarettes.  Noted lipids:  HDL 27.6, Trig 295.  Patient is on Crestor.  Preferred Learning Style:   No preference indicated   Learning Readiness:   Ready  Change in progress   MEDICATIONS: see list, includes Jenuvia, Glimepriride, and Metformin   DIETARY INTAKE:  Usual eating pattern includes 2 meals and 1-2 snacks per day.  Everyday foods include eating out [employer], [employer], [place], [place].  Avoided foods include fast food.    24-hr recall: work days B ( AM): [place]: ham biscuit or egg biscuit and diet soda or cereal Snk ( AM):   L ( PM): 2 reeces cups if blood sugar is low Snk ( PM):  D ( PM): 1/2 steak and cheese sub from [place] (4") Snk ( PM): raw veges and dip and apples or cheetos, or [place] homemade peanut butter crackers Beverages: coffee with sugar sub and cream, diet soda, water  24-hr recall: weekends B ( AM): Your House:  Eggs and grits, 1 slice of toast with strawberry jelly, coffee with sugar sub and cream Snk ( AM):   L ( PM):  Snk ( PM):  carrots and dip D ( PM): small steak, 1/2 sweet potato, salad with ranch or [place] and bread with butter Snk ( PM):   Usual physical activity: walks a lot at work, watches grandson on weekends  Estimated energy needs: 1400 calories 158 g carbohydrates 88 g protein 47 g fat  Progress Towards Goal(s):  In progress.   Nutritional Diagnosis:  NB-1.1 Food and nutrition-related knowledge deficit As related to balance of carbohydrate, protein, and fat.  As evidenced by hx.    Intervention:  Nutrition counseling and diabetes education initiated. Discussed Carb Counting by food group as method of portion control, reading food labels, and benefits of increased activity. Also discussed basic physiology of Diabetes, target BG ranges pre and post meals, and A1c. Discussed low blood sugar and treatment requirements.  Discussed stopping use of vapor cigarettes. . Plan:  Aim for 3 Carb Choices per meal (45 grams) +/- 1 either way  Aim for 0-2 Carbs per snack if hungry  Include protein in moderation with your meals and snacks.  Consider trying hummus with veges. Consider reading food labels for Total Carbohydrate and Fat Grams of foods Be as active as you can.  Walking is great! Consider checking BG at alternate times per day as directed by MD  Consider taking medication as directed  by MD   Teaching Method Utilized:  Visual Auditory Hands on  Handouts given during visit include: Food Label handouts Meal Plan Card Snack list  Barriers to learning/adherence to lifestyle change: challenges with working 3rd shift  Demonstrated degree of understanding via:  Teach Back   Monitoring/Evaluation:  Dietary intake, exercise, label reading, and body weight prn.

## 2014-05-28 ENCOUNTER — Telehealth: Payer: Self-pay | Admitting: Family Medicine

## 2014-05-29 NOTE — Telephone Encounter (Signed)
Pt request refill of the following: metFORMIN (GLUCOPHAGE) 1000 MG tablet   Phamacy:  [facility]

## 2014-05-30 LAB — HM MAMMOGRAPHY

## 2014-05-30 NOTE — Telephone Encounter (Signed)
Rx done. 

## 2014-06-01 ENCOUNTER — Encounter: Payer: Self-pay | Admitting: Family Medicine

## 2014-06-02 ENCOUNTER — Telehealth: Payer: Self-pay | Admitting: Family Medicine

## 2014-06-02 MED ORDER — OLMESARTAN MEDOXOMIL-HCTZ 20-12.5 MG PO TABS: 1.0000 | ORAL_TABLET | Freq: Every day | ORAL | Status: DC

## 2014-06-02 NOTE — Telephone Encounter (Signed)
[id number] - [place], [place] requesting refill of BENICAR HCT 20-12.5 MG per tablet, last refilled 05/07/14.

## 2014-06-02 NOTE — Telephone Encounter (Signed)
rx sent for 1 year

## 2014-06-14 ENCOUNTER — Encounter: Payer: Self-pay | Admitting: Family Medicine

## 2014-06-26 ENCOUNTER — Other Ambulatory Visit: Payer: Self-pay | Admitting: Family Medicine

## 2014-06-30 ENCOUNTER — Encounter: Payer: Self-pay | Admitting: Family Medicine

## 2014-07-03 ENCOUNTER — Telehealth: Payer: Self-pay | Admitting: Family Medicine

## 2014-07-03 MED ORDER — GLUCOSE BLOOD VI STRP: ORAL_STRIP | Status: DC

## 2014-07-03 NOTE — Telephone Encounter (Signed)
Rx done. 

## 2014-07-03 NOTE — Telephone Encounter (Signed)
Patient would like glucose blood (ONE TOUCH ULTRA TEST) test strip  Sent to [id number] - [place], [place]

## 2014-07-18 ENCOUNTER — Ambulatory Visit (AMBULATORY_SURGERY_CENTER): Payer: Self-pay

## 2014-07-18 VITALS — Ht 63.0 in | Wt 156.0 lb

## 2014-07-18 DIAGNOSIS — Z86010 Personal history of colonic polyps: Secondary | ICD-10-CM

## 2014-07-18 NOTE — Progress Notes (Signed)
Per pt, no allergies to soy or egg products.Pt not taking any weight loss meds or using  O2 at home. 

## 2014-08-01 ENCOUNTER — Encounter: Payer: Self-pay | Admitting: Internal Medicine

## 2014-08-01 ENCOUNTER — Ambulatory Visit (AMBULATORY_SURGERY_CENTER): Payer: 59 | Admitting: Internal Medicine

## 2014-08-01 VITALS — BP 118/55 | HR 50 | Temp 97.4°F | Resp 26 | Ht 63.0 in | Wt 156.0 lb

## 2014-08-01 DIAGNOSIS — Z86010 Personal history of colonic polyps: Secondary | ICD-10-CM | POA: Diagnosis present

## 2014-08-01 DIAGNOSIS — D122 Benign neoplasm of ascending colon: Secondary | ICD-10-CM | POA: Diagnosis not present

## 2014-08-01 DIAGNOSIS — D120 Benign neoplasm of cecum: Secondary | ICD-10-CM

## 2014-08-01 MED ORDER — SODIUM CHLORIDE 0.9 % IV SOLN: 500.0000 mL | INTRAVENOUS | Status: DC

## 2014-08-01 NOTE — Progress Notes (Signed)
Called to room to assist during endoscopic procedure.  Patient ID and intended procedure confirmed with present staff. Received instructions for my participation in the procedure from the performing physician.  

## 2014-08-01 NOTE — Progress Notes (Signed)
Pt. Given work note for return to work tomorrow night.l

## 2014-08-01 NOTE — Op Note (Signed)
[facility]  [address] [place], [id number]   COLONOSCOPY PROCEDURE REPORT  PATIENT: Andrea [name], Andrea [name]  MR#: [id number] BIRTHDATE: 1955-06-13 , 57  yrs. old GENDER: female ENDOSCOPIST: Gatha Mayer, MD, [facility] PROCEDURE DATE:  08/01/2014 PROCEDURE:   Colonoscopy, surveillance , Colonoscopy with biopsy, and Colonoscopy with snare polypectomy First Screening Colonoscopy - Avg.  risk and is 50 yrs.  old or older - No.  Prior Negative Screening - Now for repeat screening. N/A  History of Adenoma - Now for follow-up colonoscopy & has been > or = to 3 yrs.  Yes hx of adenoma.  Has been 3 or more years since last colonoscopy. ASA CLASS:   Class III INDICATIONS:Surveillance due to prior colonic neoplasia and PH Colon Adenoma. MEDICATIONS: Propofol 200 mg IV and Monitored anesthesia care  DESCRIPTION OF PROCEDURE:   After the risks benefits and alternatives of the procedure were thoroughly explained, informed consent was obtained.  The digital rectal exam revealed no abnormalities of the rectum.   The [id number]  endoscope was introduced through the anus and advanced to the cecum, which was identified by both the appendix and ileocecal valve. No adverse events experienced.   The quality of the prep was good.  (MiraLax was used)  The instrument was then slowly withdrawn as the colon was fully examined.      COLON FINDINGS: Three sessile polyps ranging from 2 to 40mm in size were found at the cecum and in the ascending colon.  Polypectomies were performed with cold forceps (2) and with a cold snare (1). The resection was complete, the polyp tissue was completely retrieved and sent to histology.   The examination was otherwise normal.  Retroflexed views revealed no abnormalities. The time to cecum = 3.2 Withdrawal time = 14.1   The scope was withdrawn and the procedure completed. COMPLICATIONS: There were no immediate complications.  ENDOSCOPIC  IMPRESSION: 1.   Three sessile polyps ranging from 2 to 71mm in size were found at the cecum and in the ascending colon; polypectomies were performed with cold forceps and with a cold snare 2.   The examination was otherwise normal - good prep  RECOMMENDATIONS: Timing of repeat colonoscopy will be determined by pathology findings. Hx adenoma (<1cm) 2007  eSigned:  Gatha Mayer, MD, [facility] 08/01/2014 10:17 AM   cc: The Patient

## 2014-08-01 NOTE — Patient Instructions (Addendum)
I found and removed 3 small polyps that look benign. I will let you know pathology results and when to have another routine colonoscopy by mail.  I appreciate the opportunity to care for you. Gatha Mayer, MD, FACG  YOU HAD AN ENDOSCOPIC PROCEDURE TODAY AT [facility]:   Refer to the procedure report that was given to you for any specific questions about what was found during the examination.  If the procedure report does not answer your questions, please call your gastroenterologist to clarify.  If you requested that your care partner not be given the details of your procedure findings, then the procedure report has been included in a sealed envelope for you to review at your convenience later.  YOU SHOULD EXPECT: Some feelings of bloating in the abdomen. Passage of more gas than usual.  Walking can help get rid of the air that was put into your GI tract during the procedure and reduce the bloating. If you had a lower endoscopy (such as a colonoscopy or flexible sigmoidoscopy) you may notice spotting of blood in your stool or on the toilet paper. If you underwent a bowel prep for your procedure, you may not have a normal bowel movement for a few days.  Please Note:  You might notice some irritation and congestion in your nose or some drainage.  This is from the oxygen used during your procedure.  There is no need for concern and it should clear up in a day or so.  SYMPTOMS TO REPORT IMMEDIATELY:   Following lower endoscopy (colonoscopy or flexible sigmoidoscopy):  Excessive amounts of blood in the stool  Significant tenderness or worsening of abdominal pains  Swelling of the abdomen that is new, acute  Fever of 100F or higher   For urgent or emergent issues, a gastroenterologist can be reached at any hour by calling [phone].   DIET: Your first meal following the procedure should be a small meal and then it is ok to progress to your normal diet. Heavy or  fried foods are harder to digest and may make you feel nauseous or bloated.  Likewise, meals heavy in dairy and vegetables can increase bloating.  Drink plenty of fluids but you should avoid alcoholic beverages for 24 hours.  ACTIVITY:  You should plan to take it easy for the rest of today and you should NOT DRIVE or use heavy machinery until tomorrow (because of the sedation medicines used during the test).    FOLLOW UP: Our staff will call the number listed on your records the next business day following your procedure to check on you and address any questions or concerns that you may have regarding the information given to you following your procedure. If we do not reach you, we will leave a message.  However, if you are feeling well and you are not experiencing any problems, there is no need to return our call.  We will assume that you have returned to your regular daily activities without incident.  If any biopsies were taken you will be contacted by phone or by letter within the next 1-3 weeks.  Please call us at [phone] if you have not heard about the biopsies in 3 weeks.    SIGNATURES/CONFIDENTIALITY: You and/or your care partner have signed paperwork which will be entered into your electronic medical record.  These signatures attest to the fact that that the information above on your After Visit Summary has been reviewed and is  understood.  Full responsibility of the confidentiality of this discharge information lies with you and/or your care-partner.  Polyp information given.

## 2014-08-01 NOTE — Progress Notes (Signed)
Patient awakening,vss,report to rn 

## 2014-08-02 ENCOUNTER — Telehealth: Payer: Self-pay

## 2014-08-02 NOTE — Telephone Encounter (Signed)
  Follow up Call-  Call back number 08/01/2014  Post procedure Call Back phone  # [phone]  Permission to leave phone message Yes     Patient questions:  Do you have a fever, pain , or abdominal swelling? No. Pain Score  0 *  Have you tolerated food without any problems? Yes.    Have you been able to return to your normal activities? Yes.    Do you have any questions about your discharge instructions: Diet   No. Medications  No. Follow up visit  No.  Do you have questions or concerns about your Care? No.  Actions: * If pain score is 4 or above: No action needed, pain <4.

## 2014-08-04 ENCOUNTER — Ambulatory Visit (INDEPENDENT_AMBULATORY_CARE_PROVIDER_SITE_OTHER): Payer: 59 | Admitting: Family Medicine

## 2014-08-04 ENCOUNTER — Other Ambulatory Visit: Payer: Self-pay | Admitting: *Deleted

## 2014-08-04 ENCOUNTER — Encounter: Payer: Self-pay | Admitting: Family Medicine

## 2014-08-04 VITALS — BP 100/60 | HR 65 | Temp 98.3°F | Ht 63.0 in | Wt 156.1 lb

## 2014-08-04 DIAGNOSIS — E1140 Type 2 diabetes mellitus with diabetic neuropathy, unspecified: Secondary | ICD-10-CM

## 2014-08-04 DIAGNOSIS — I2510 Atherosclerotic heart disease of native coronary artery without angina pectoris: Secondary | ICD-10-CM | POA: Diagnosis not present

## 2014-08-04 DIAGNOSIS — I10 Essential (primary) hypertension: Secondary | ICD-10-CM

## 2014-08-04 DIAGNOSIS — E785 Hyperlipidemia, unspecified: Secondary | ICD-10-CM

## 2014-08-04 LAB — BASIC METABOLIC PANEL
BUN: 21 mg/dL (ref 6–23)
CHLORIDE: 106 meq/L (ref 96–112)
CO2: 23 mEq/L (ref 19–32)
Calcium: 9.7 mg/dL (ref 8.4–10.5)
Creatinine, Ser: 0.88 mg/dL (ref 0.40–1.20)
GFR: 69.41 mL/min (ref >60.00)
Glucose, Bld: 131 mg/dL — ABNORMAL HIGH (ref 70–99)
POTASSIUM: 3.8 meq/L (ref 3.5–5.1)
Sodium: 137 mEq/L (ref 135–145)

## 2014-08-04 LAB — HEMOGLOBIN A1C: Hgb A1c MFr Bld: 6.4 % (ref 4.6–6.5)

## 2014-08-04 LAB — LDL CHOLESTEROL, DIRECT: Direct LDL: 51 mg/dL

## 2014-08-04 LAB — LIPID PANEL
Cholesterol: 105 mg/dL (ref 0–200)
HDL: 29 mg/dL — AB (ref >39.00)
NonHDL: 76
TRIGLYCERIDES: 270 mg/dL — AB (ref 0.0–149.0)
Total CHOL/HDL Ratio: 4
VLDL: 54 mg/dL — AB (ref 0.0–40.0)

## 2014-08-04 MED ORDER — GLUCOSE BLOOD VI STRP: ORAL_STRIP | Status: DC

## 2014-08-04 NOTE — Progress Notes (Signed)
Pre visit review using our clinic review tool, if applicable. No additional management support is needed unless otherwise documented below in the visit note. 

## 2014-08-04 NOTE — Progress Notes (Signed)
HPI:  DM: -complications: neuropathy  -meds: metformin 1000mg  bid; Januvia 100mg  daily, glimepiride 4 daily, asa 81mg  daily, arb -diet and exercise: no regular CV exercise; she reports she has been working on her diet - has cut out a lot of sugar and carbs - drinking diet drinks -referred to diabetes educator - she did do this -last eye exam - sees Dr. Kennis Carina -denies: polyuria, polydipsia, vision loss, wounds on her feet, hypoglycemia  HLD: -meds: crestor -denies: leg cramps or cog decline  Hx CAD, HTN: -benicar 20-12.5, metoprolol 25mg  bid, asa 81mg  daily, statin -denies: CP, sob, DOE, swelling, palpitations  Hx Seasonal Allergies: -uses allegra d prn  Hx of Smoking: -> 30 pack year -she is using vapor cigarettes -discussed and offered lung cancer screening - declined  Grieving: -uncle, father and another family member (nieces husband) passed all in the last 3 months -she reports she is coping ok despite all of this -no SI, thoughts of self harm, depression  HM: -pap - agrees to set up physical -HIV - declined -shingles - declined -foot exam - today   ROS: See pertinent positives and negatives per HPI.  Past Medical History  Diagnosis Date  . Diabetes mellitus   . Hypertension   . Low back pain   . Hyperlipidemia   . CAD (coronary artery disease)     has 3 stents  . Arthritis   . Anemia   . Cataract   . Allergy     Past Surgical History  Procedure Laterality Date  . Cesarean section      2 times  . Knee surgery for torn cartillige      right knee  . Carpal tunnel release both hands     . Breast lumpectomy    . Stents 2001  2001    3 stents  . Rotator cuff surgery      right side  . Triger finger repair      both hands, right hand done twice  . Eye surgery      lens replaced/02/06/15 and 03/09/15  . Stents  2000  . Colonoscopy      Family History  Problem Relation Age of Onset  . Heart disease Mother   . Hypertension Mother   . Heart  disease Father   . Heart disease Sister   . Pancreatic cancer Paternal Uncle     History   Social History  . Marital Status: Divorced    Spouse Name: N/A  . Number of Children: N/A  . Years of Education: N/A   Social History Main Topics  . Smoking status: Current Some Day Smoker    Types: Cigarettes    Last Attempt to Quit: 06/20/2010  . Smokeless tobacco: Never Used     Comment: smoked 1ppd for> 30 years/smokes occasionally,some vapor cigarettes  . Alcohol Use: No  . Drug Use: No  . Sexual Activity: Yes   Other Topics Concern  . 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  aspirin 81 MG tablet, Take 81 mg by mouth daily.  , Disp: , Rfl:  .  bisacodyl (DULCOLAX) 5 MG EC tablet, Take 5 mg by mouth. Dulcolax 5  mg bowel prep # 4-Take as directed, Disp: , Rfl:  .  CRESTOR 10 MG tablet, TAKE 1 TABLET BY MOUTH EVERY DAY, Disp: 90 tablet, Rfl: 3 .  glimepiride (AMARYL) 4 MG tablet, TAKE 1 TABLET BY MOUTH EVERY DAY AT 6 PM, Disp: 30 tablet, Rfl: 10 .  glucose blood (ONE TOUCH ULTRA TEST) test strip, Use as instructed to check blood sugar once a day, Disp: 100 each, Rfl: 0 .  JANUVIA 100 MG tablet, TAKE 1 TABLET BY MOUTH EVERY DAY, Disp: 90 tablet, Rfl: 3 .  metFORMIN (GLUCOPHAGE) 1000 MG tablet, TAKE 1 TABLET BY MOUTH TWICE DAILY AT 4 AM AND 6 PM ON WORK DAYS AND AT 7-8 AM AND 6 PM ON NON-WORK DAYS, Disp: 60 tablet, Rfl: 2 .  metoprolol (LOPRESSOR) 50 MG tablet, TAKE 1/2 TABLET BY MOUTH TWICE DAILY, Disp: 30 tablet, Rfl: 2 .  Multiple Vitamins-Minerals (OSTEO COMPLEX) CAPS, Take by mouth daily.  , Disp: , Rfl:  .  olmesartan-hydrochlorothiazide (BENICAR HCT) 20-12.5 MG per tablet, Take 1 tablet by mouth daily., Disp: 90 tablet, Rfl: 3 .  ONETOUCH DELICA LANCETS FINE MISC, 1  Stick by Does not apply route daily., Disp: 100 each, Rfl: 3  EXAM:  Filed Vitals:   08/04/14 0856  BP: 100/60  Pulse: 65  Temp: 98.3 F (36.8 C)    Body mass index is 27.66 kg/(m^2).  GENERAL: vitals reviewed and listed above, alert, oriented, appears well hydrated and in no acute distress  HEENT: atraumatic, conjunttiva clear, no obvious abnormalities on inspection of external nose and ears  NECK: no obvious masses on inspection  LUNGS: clear to auscultation bilaterally, no wheezes, rales or rhonchi, good air movement  CV: HRRR, no peripheral edema  MS: moves all extremities without noticeable abnormality  PSYCH: pleasant and cooperative, no obvious depression or anxiety  FOOT EXAM: see chart  ASSESSMENT AND PLAN:  Discussed the following assessment and plan:  Coronary artery disease involving native coronary artery of native heart without angina pectoris  Type 2 diabetes mellitus with diabetic neuropathy - Plan: Hemoglobin A1c  Essential hypertension - Plan: Basic metabolic panel  Hyperlipemia - Plan: Lipid Panel  -Patient advised to return or notify a doctor immediately if symptoms worsen or persist or new concerns arise.  Patient Instructions  BEFORE YOU LEAVE: -labs -schedule PHYSICAL with pap in 3 months  -We have ordered labs or studies at this visit. It can take up to 1-2 weeks for results and processing. We will contact you with instructions IF your results are abnormal. Normal results will be released to your [facility]. If you have not heard from [place] or can not find your results in [facility] in 2 weeks please contact our office.  We recommend the following healthy lifestyle measures: - eat a healthy diet consisting of lots of vegetables, fruits, beans, nuts, seeds, healthy meats such as white chicken and fish and whole grains.  - avoid fried foods, fast food, processed foods, sodas, red meet and other fattening foods.  - get a least 150 minutes of aerobic  exercise per week.         Colin Benton R.

## 2014-08-04 NOTE — Patient Instructions (Signed)
BEFORE YOU LEAVE: -labs -schedule PHYSICAL with pap in 3 months  -We have ordered labs or studies at this visit. It can take up to 1-2 weeks for results and processing. We will contact you with instructions IF your results are abnormal. Normal results will be released to your [facility]. If you have not heard from [place] or can not find your results in [facility] in 2 weeks please contact our office.  We recommend the following healthy lifestyle measures: - eat a healthy diet consisting of lots of vegetables, fruits, beans, nuts, seeds, healthy meats such as white chicken and fish and whole grains.  - avoid fried foods, fast food, processed foods, sodas, red meet and other fattening foods.  - get a least 150 minutes of aerobic exercise per week.

## 2014-08-09 ENCOUNTER — Encounter: Payer: Self-pay | Admitting: Internal Medicine

## 2014-08-09 DIAGNOSIS — Z86010 Personal history of colonic polyps: Secondary | ICD-10-CM

## 2014-08-09 DIAGNOSIS — Z860101 Personal history of adenomatous and serrated colon polyps: Secondary | ICD-10-CM

## 2014-08-09 HISTORY — DX: Personal history of colonic polyps: Z86.010

## 2014-08-09 HISTORY — DX: Personal history of adenomatous and serrated colon polyps: Z86.0101

## 2014-08-09 NOTE — Progress Notes (Signed)
Quick Note:  3 adenomas max 7 mm Repeat colonoscopy 2019 ______

## 2014-08-25 ENCOUNTER — Other Ambulatory Visit: Payer: Self-pay | Admitting: Family Medicine

## 2014-09-27 ENCOUNTER — Other Ambulatory Visit: Payer: Self-pay | Admitting: Family Medicine

## 2014-10-26 ENCOUNTER — Other Ambulatory Visit: Payer: Self-pay | Admitting: Family Medicine

## 2014-10-26 MED ORDER — GLIMEPIRIDE 4 MG PO TABS: ORAL_TABLET | ORAL | Status: DC

## 2014-10-26 NOTE — Telephone Encounter (Signed)
Sent to the pharmacy by e-scribe for 30 days.  Pt has upcoming cpx scheduled on 11/23/14 with Dr. Maudie Mercury.

## 2014-10-27 ENCOUNTER — Telehealth: Payer: Self-pay | Admitting: Family Medicine

## 2014-10-27 MED ORDER — METOPROLOL TARTRATE 50 MG PO TABS: 25.0000 mg | ORAL_TABLET | Freq: Two times a day (BID) | ORAL | Status: DC

## 2014-10-27 NOTE — Telephone Encounter (Signed)
Rx done. 

## 2014-10-27 NOTE — Telephone Encounter (Signed)
Pt request refill metoprolol (LOPRESSOR) 50 MG tablet Pt had called the pharm, but they failed to send. Now pt is out and needs asap.  Pt has appt in august w/ dr kim.  [facility]/ [address]

## 2014-11-13 ENCOUNTER — Other Ambulatory Visit: Payer: Self-pay | Admitting: *Deleted

## 2014-11-13 MED ORDER — ROSUVASTATIN CALCIUM 10 MG PO TABS: 10.0000 mg | ORAL_TABLET | Freq: Every day | ORAL | Status: DC

## 2014-11-13 MED ORDER — SITAGLIPTIN PHOSPHATE 100 MG PO TABS: 100.0000 mg | ORAL_TABLET | Freq: Every day | ORAL | Status: DC

## 2014-11-23 ENCOUNTER — Encounter: Payer: Self-pay | Admitting: Family Medicine

## 2014-11-23 ENCOUNTER — Ambulatory Visit (INDEPENDENT_AMBULATORY_CARE_PROVIDER_SITE_OTHER): Payer: 59 | Admitting: Family Medicine

## 2014-11-23 ENCOUNTER — Other Ambulatory Visit (HOSPITAL_COMMUNITY)
Admission: RE | Admit: 2014-11-23 | Discharge: 2014-11-23 | Disposition: A | Discharge status: Home / Self Care | Payer: 59 | Source: Ambulatory Visit | Attending: Family Medicine | Admitting: Family Medicine

## 2014-11-23 VITALS — BP 120/62 | HR 63 | Temp 97.7°F | Ht 63.75 in | Wt 152.6 lb

## 2014-11-23 DIAGNOSIS — Z0000 Encounter for general adult medical examination without abnormal findings: Secondary | ICD-10-CM | POA: Diagnosis not present

## 2014-11-23 DIAGNOSIS — Z1159 Encounter for screening for other viral diseases: Secondary | ICD-10-CM

## 2014-11-23 DIAGNOSIS — Z01419 Encounter for gynecological examination (general) (routine) without abnormal findings: Secondary | ICD-10-CM | POA: Insufficient documentation

## 2014-11-23 DIAGNOSIS — E1159 Type 2 diabetes mellitus with other circulatory complications: Secondary | ICD-10-CM

## 2014-11-23 DIAGNOSIS — I2510 Atherosclerotic heart disease of native coronary artery without angina pectoris: Secondary | ICD-10-CM

## 2014-11-23 DIAGNOSIS — Z124 Encounter for screening for malignant neoplasm of cervix: Secondary | ICD-10-CM

## 2014-11-23 DIAGNOSIS — Z720 Tobacco use: Secondary | ICD-10-CM

## 2014-11-23 DIAGNOSIS — E785 Hyperlipidemia, unspecified: Secondary | ICD-10-CM

## 2014-11-23 DIAGNOSIS — Z789 Other specified health status: Secondary | ICD-10-CM

## 2014-11-23 DIAGNOSIS — I10 Essential (primary) hypertension: Secondary | ICD-10-CM

## 2014-11-23 DIAGNOSIS — Z1151 Encounter for screening for human papillomavirus (HPV): Secondary | ICD-10-CM | POA: Insufficient documentation

## 2014-11-23 LAB — BASIC METABOLIC PANEL
BUN: 20 mg/dL (ref 6–23)
CHLORIDE: 104 meq/L (ref 96–112)
CO2: 25 meq/L (ref 19–32)
Calcium: 10.2 mg/dL (ref 8.4–10.5)
Creatinine, Ser: 0.85 mg/dL (ref 0.40–1.20)
GFR: 72.17 mL/min (ref >60.00)
Glucose, Bld: 153 mg/dL — ABNORMAL HIGH (ref 70–99)
Potassium: 4.2 mEq/L (ref 3.5–5.1)
Sodium: 139 mEq/L (ref 135–145)

## 2014-11-23 LAB — LIPID PANEL
CHOL/HDL RATIO: 4
Cholesterol: 122 mg/dL (ref 0–200)
HDL: 33.9 mg/dL — AB (ref >39.00)
LDL Cholesterol: 65 mg/dL (ref 0–99)
NonHDL: 88.03
TRIGLYCERIDES: 113 mg/dL (ref 0.0–149.0)
VLDL: 22.6 mg/dL (ref 0.0–40.0)

## 2014-11-23 LAB — HEMOGLOBIN A1C: Hgb A1c MFr Bld: 6.8 % — ABNORMAL HIGH (ref 4.6–6.5)

## 2014-11-23 NOTE — Addendum Note (Signed)
Addended by: Agnes Lawrence on: 11/23/2014 10:42 AM   Modules accepted: Orders

## 2014-11-23 NOTE — Patient Instructions (Addendum)
BEFORE YOU LEAVE: -labs -follow up in 3 months -[name], can you check on her 6 month mammogram follow up with [facility]? Due in September.  STop the glimeperide (amaryle) for now and ensure eating 3 small healthy meals per day and continue the exercises If bloods sugars continue to run low please let us know immediately  Please stop the e-cigarettes  Get you flu shot when available, it should be available August 22nd in our office  We recommend the following healthy lifestyle measures: - eat a healthy diet consisting small regular meals of vegetables, fruits, beans, nuts, seeds, healthy meats such as white chicken and fish - avoid fried foods, starches, sweets, fast food, processed foods, sodas, red meet and other fattening foods.  - get a least 150 minutes of aerobic exercise per week.

## 2014-11-23 NOTE — Progress Notes (Signed)
Pre visit review using our clinic review tool, if applicable. No additional management support is needed unless otherwise documented below in the visit note. 

## 2014-11-23 NOTE — Progress Notes (Addendum)
HPI:  Here for CPE:  -Concerns and/or follow up today:   DM: -complications: neuropathy  -meds: metformin 1000mg  bid; Januvia 100mg  daily, glimepiride 4 daily, asa 81mg  daily, arb -diet and exercise: she has been increasing her exercise - walking daily; she reports she has been working on her diet - has cut out a lot of sugar and carbs - drinking diet drinks -referred to diabetes educator - she did do this -last eye exam - sees Dr. Kennis Carina, 07/2014 -eats only 2 meals per day, has some low blood sugars the last few days -denies: polyuria, polydipsia, vision loss, wounds on her feet  HLD: -meds: crestor -denies: leg cramps or cog decline  Hx CAD, HTN: -benicar 20-12.5, metoprolol 25mg  bid, asa 81mg  daily, statin -denies: CP, sob, DOE, swelling, palpitations  Hx Seasonal Allergies: -uses allegra d prn  Hx of Smoking: -> 30 pack year -she is using vapor cigarettes -discussed and offered lung cancer screening - declined, continues to decline today  -Diet: variety of foods, balance and well rounded - only 2 meals per day  -Exercise:  regular exercise  -Taking folic acid, vitamin D or calcium: no  -Diabetes and Dyslipidemia Screening: doing labs today, FASTINg  -Hx of HTN: no  -Vaccines: UTD  -pap history: she can't remember  -FDLMP: n/a  -sexual activity: yes, female partner, no new partners  -wants STI testing (Hep C if born [birth year]): no  -FH breast, colon or ovarian ca: see FH Last mammogram: done this year, needs mammogram in 12/2014 Last colon cancer screening: done this year  Breast Ca Risk Assessment: -see above, no sig FH, but FC changes and having repeat mammo  -Alcohol, Tobacco, drug use: see social history  Review of Systems - no fevers, unintentional weight loss, vision loss, hearing loss, chest pain, sob, hemoptysis, melena, hematochezia, hematuria, genital discharge, changing or concerning skin lesions, bleeding, bruising, loc, thoughts of self harm  or SI  Past Medical History  Diagnosis Date  . Diabetes mellitus   . Hypertension   . Low back pain   . Hyperlipidemia   . CAD (coronary artery disease)     has 3 stents  . Arthritis   . Anemia   . Cataract   . Allergy   . Hx of adenomatous colonic polyps 08/09/2014    Past Surgical History  Procedure Laterality Date  . Cesarean section      2 times  . Knee surgery for torn cartillige      right knee  . Carpal tunnel release both hands     . Breast lumpectomy    . Stents 2001  2001    3 stents  . Rotator cuff surgery      right side  . Triger finger repair      both hands, right hand done twice  . Eye surgery      lens replaced/02/06/15 and 03/09/15  . Stents  2000  . Colonoscopy    . Lasik  01/12/2014, 03/14/2014    Family History  Problem Relation Age of Onset  . Heart disease Mother   . Hypertension Mother   . Heart disease Father   . Heart disease Sister   . Pancreatic cancer Paternal Uncle     Social History   Social History  . Marital Status: Divorced    Spouse Name: N/A  . Number of Children: N/A  . Years of Education: N/A   Social History Main Topics  . Smoking status: Current Some Day Smoker  Types: Cigarettes    Last Attempt to Quit: 06/20/2010  . Smokeless tobacco: Never Used     Comment: smoked 1ppd for> 30 years/smokes occasionally,some vapor cigarettes  . Alcohol Use: No  . Drug Use: No  . Sexual Activity: Yes   Other Topics Concern  . 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  aspirin 81 MG tablet, Take 81 mg by mouth daily.  , Disp: , Rfl:  .  bisacodyl (DULCOLAX) 5 MG EC tablet, Take 5 mg by mouth. Dulcolax 5 mg bowel prep # 4-Take as directed, Disp: , Rfl:  .  glimepiride (AMARYL) 4 MG tablet, TAKE 1 TABLET BY  MOUTH EVERY DAY AT 6 PM, Disp: 30 tablet, Rfl: 0 .  glucose blood (ONE TOUCH ULTRA TEST) test strip, USE TO CHECK BLOOD SUGAR DAILY AND PRN, Disp: 300 each, Rfl: 3 .  metFORMIN (GLUCOPHAGE) 1000 MG tablet, TAKE 1 TABLET BY MOUTH TWICE DAILY AT 4 AM AND 6 PM ON WORK DAYS AND AT 7 TO 8 AM AND 6 PM ON NON-WORK DAYS, Disp: 60 tablet, Rfl: 3 .  metoprolol (LOPRESSOR) 50 MG tablet, Take 0.5 tablets (25 mg total) by mouth 2 (two) times daily., Disp: 30 tablet, Rfl: 1 .  Multiple Vitamins-Minerals (OSTEO COMPLEX) CAPS, Take by mouth daily.  , Disp: , Rfl:  .  olmesartan-hydrochlorothiazide (BENICAR HCT) 20-12.5 MG per tablet, Take 1 tablet by mouth daily., Disp: 90 tablet, Rfl: 3 .  ONETOUCH DELICA LANCETS FINE MISC, 1 Stick by Does not apply route daily., Disp: 100 each, Rfl: 3 .  rosuvastatin (CRESTOR) 10 MG tablet, Take 1 tablet (10 mg total) by mouth daily., Disp: 90 tablet, Rfl: 1 .  sitaGLIPtin (JANUVIA) 100 MG tablet, Take 1 tablet (100 mg total) by mouth daily., Disp: 90 tablet, Rfl: 0  EXAM:  Filed Vitals:   11/23/14 0927  BP: 120/62  Pulse: 63  Temp: 97.7 F (36.5 C)   Body mass index is 26.41 kg/(m^2).   GENERAL: vitals reviewed and listed below, alert, oriented, appears well hydrated and in no acute distress  HEENT: head atraumatic, PERRLA, normal appearance of eyes, ears, nose and mouth. moist mucus membranes.  NECK: supple, no masses or lymphadenopathy  LUNGS: clear to auscultation bilaterally, no rales, rhonchi or wheeze  CV: HRRR, no peripheral edema or cyanosis, normal pedal pulses  BREAST: normal appearance - no lesions or discharge, on palpation normal breast tissue without any suspicious masses  ABDOMEN: bowel sounds normal, soft, non tender to palpation, no masses, no rebound or guarding  GU: normal appearance of external genitalia - no lesions or masses, normal vaginal mucosa - no abnormal discharge, normal appearance of cervix, located to the L - no lesions or  abnormal discharge, no masses or tenderness on palpation of uterus and ovaries. Note: pt was squeezing legs very hard and could not relax pelvic floor muscles during pap, making procedure difficult  RECTAL: refused  SKIN: no rash or abnormal lesions  MS: normal gait, moves all extremities normally  NEURO: CN II-XII grossly intact, normal muscle strength and sensation to light touch on extremities  PSYCH: normal affect, pleasant and cooperative  ASSESSMENT AND PLAN:  Discussed the following assessment and plan:  Hyperlipemia - Plan: Lipid Panel  Essential hypertension  Atherosclerosis of native coronary artery of native heart without angina pectoris  Type 2 diabetes mellitus with other circulatory complications - Plan: Hemoglobin J5T, Basic metabolic panel  Electronic cigarette use  Need for hepatitis C screening test - Plan: Hep C Antibody  -counseled on lung ca screening, she declined  -advised to stop e-cig  -stop amaryl due to low BS, cont exercise, regular healthy meals, hypglycemia management and labs discussed  -follow up 3 months  -Discussed and advised all [place] preventive services health task force level A and B recommendations for age, sex and risks.  -Advised at least 150 minutes of exercise per week and a healthy diet low in saturated fats and sweets and consisting of fresh fruits and vegetables, lean meats such as fish and white chicken and whole grains.  -FASTING labs, studies and vaccines per orders this encounter  Orders Placed This Encounter  Procedures  . Lipid Panel  . Hemoglobin A1C  . Hep C Antibody  . Basic metabolic panel    Patient advised to return to clinic immediately if symptoms worsen or persist or new concerns.  Patient Instructions  BEFORE YOU LEAVE: -labs -follow up in 3 months -[name], can you check on her 6 month mammogram follow up with [facility]? Due in September.  STop the glimeperide (amaryle) for now and ensure eating 3 small  healthy meals per day and continue the exercises If bloods sugars continue to run low please let us know immediately  Please stop the e-cigarettes  Get you flu shot when available, it should be available August 22nd in our office  We recommend the following healthy lifestyle measures: - eat a healthy diet consisting small regular meals of vegetables, fruits, beans, nuts, seeds, healthy meats such as white chicken and fish - avoid fried foods, starches, sweets, fast food, processed foods, sodas, red meet and other fattening foods.  - get a least 150 minutes of aerobic exercise per week.      Return in about 3 months (around 02/23/2015) for follow up.  Colin Benton R.

## 2014-11-24 LAB — HEPATITIS C ANTIBODY: HCV Ab: NEGATIVE

## 2014-11-24 LAB — CYTOLOGY - PAP

## 2014-12-20 LAB — HM MAMMOGRAPHY

## 2014-12-26 ENCOUNTER — Other Ambulatory Visit: Payer: Self-pay | Admitting: Family Medicine

## 2015-01-02 ENCOUNTER — Encounter: Payer: Self-pay | Admitting: Family Medicine

## 2015-01-09 ENCOUNTER — Telehealth: Payer: Self-pay | Admitting: Family Medicine

## 2015-01-09 ENCOUNTER — Encounter: Payer: Self-pay | Admitting: Family Medicine

## 2015-01-09 NOTE — Telephone Encounter (Signed)
10:46am-Per Dr Maudie Mercury the pt needs an appt.  I left a message for the pt to return my call.

## 2015-01-09 NOTE — Telephone Encounter (Signed)
Patient Name: Andrea [name]  DOB: [birth year]    Initial Comment caller states she has been taken off her blood sugar meds - she took her bld sugar this am - 173 - wants to know if she can go back on them   Nurse Assessment  Nurse: Leilani Merl, RN, Nira Conn Date/Time (Lyman Time): 01/09/2015 9:34:16 AM  Confirm and document reason for call. If symptomatic, describe symptoms. ---caller states she has been taken off her blood sugar meds - she took her bld sugar this am - 173 - wants to know if she can go back on them  Has the patient traveled out of the country within the last 30 days? ---Not Applicable  Does the patient require triage? ---Yes  Related visit to physician within the last 2 weeks? ---No  Does the PT have any chronic conditions? (i.e. diabetes, asthma, etc.) ---Yes  List chronic conditions. ---DM     Guidelines    Guideline Title Affirmed Question Affirmed Notes  Diabetes - High Blood Sugar Blood glucose 60-240 mg/dl (3.5 -13 mmol/l) (all triage questions negative)    Final Disposition User   Alden, RN, Nira Conn    Comments  caller states that she is concerned that her sugar is up some now, in the 150's to 170's, she is wondering if she needs to go back on her diabetes medication, please call her to let her know if she needs to go back on her DM meds.   Disagree/Comply: Comply

## 2015-01-10 NOTE — Telephone Encounter (Signed)
I spoke with the pt and scheduled an appt for 9/30.

## 2015-01-12 ENCOUNTER — Encounter: Payer: Self-pay | Admitting: Family Medicine

## 2015-01-12 ENCOUNTER — Ambulatory Visit (INDEPENDENT_AMBULATORY_CARE_PROVIDER_SITE_OTHER): Payer: 59 | Admitting: Family Medicine

## 2015-01-12 VITALS — BP 102/60 | HR 63 | Temp 98.1°F | Ht 63.75 in | Wt 152.3 lb

## 2015-01-12 DIAGNOSIS — E1142 Type 2 diabetes mellitus with diabetic polyneuropathy: Secondary | ICD-10-CM

## 2015-01-12 DIAGNOSIS — Z23 Encounter for immunization: Secondary | ICD-10-CM

## 2015-01-12 MED ORDER — GLIMEPIRIDE 2 MG PO TABS: 2.0000 mg | ORAL_TABLET | Freq: Every day | ORAL | Status: DC

## 2015-01-12 NOTE — Progress Notes (Signed)
Pre visit review using our clinic review tool, if applicable. No additional management support is needed unless otherwise documented below in the visit note. 

## 2015-01-12 NOTE — Progress Notes (Signed)
HPI:  Acute visit for:  Hyperglycemia: DM: -complications: neuropathy  -meds: metformin 1000mg  bid; Januvia 100mg  daily, glimepiride 4 daily, asa 81mg  daily, arb -at last visit she reported aggressive liefstyle changes and a few low blood sugars so amaryl held - hgba1c 6.8 -now reports random BSs in the range of 150-233 with average around 160-170 -breakfast is low carb, dinner is worst meal of the day -last eye exam - sees Dr. Kennis Carina, 07/2014 -denies: polyuria, polydipsia, vision loss, wounds on her feet, infection, fevers, malaise  ROS: See pertinent positives and negatives per HPI.  Past Medical History  Diagnosis Date  . Diabetes mellitus   . Hypertension   . Low back pain   . Hyperlipidemia   . CAD (coronary artery disease)     has 3 stents  . Arthritis   . Anemia   . Cataract   . Allergy   . Hx of adenomatous colonic polyps 08/09/2014    Past Surgical History  Procedure Laterality Date  . Cesarean section      2 times  . Knee surgery for torn cartillige      right knee  . Carpal tunnel release both hands     . Breast lumpectomy    . Stents 2001  2001    3 stents  . Rotator cuff surgery      right side  . Triger finger repair      both hands, right hand done twice  . Eye surgery      lens replaced/02/06/15 and 03/09/15  . Stents  2000  . Colonoscopy    . Lasik  01/12/2014, 03/14/2014    Family History  Problem Relation Age of Onset  . Heart disease Mother   . Hypertension Mother   . Heart disease Father   . Heart disease Sister   . Pancreatic cancer Paternal Uncle     Social History   Social History  . Marital Status: Divorced    Spouse Name: N/A  . Number of Children: N/A  . Years of Education: N/A   Social History Main Topics  . Smoking status: Current Some Day Smoker    Types: Cigarettes    Last Attempt to Quit: 06/20/2010  . Smokeless tobacco: Never Used     Comment: smoked 1ppd for> 30 years/smokes occasionally,some vapor cigarettes   . Alcohol Use: No  . Drug Use: No  . Sexual Activity: Yes   Other Topics Concern  . 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  aspirin 81 MG tablet, Take 81 mg by mouth daily.  , Disp: , Rfl:  .  bisacodyl (DULCOLAX) 5 MG EC tablet, Take 5 mg by mouth. Dulcolax 5 mg bowel prep # 4-Take as directed, Disp: , Rfl:  .  glucose blood (ONE TOUCH ULTRA TEST) test strip, USE TO CHECK BLOOD SUGAR DAILY AND PRN, Disp: 300 each, Rfl: 3 .  metFORMIN (GLUCOPHAGE) 1000 MG tablet, TAKE 1 TABLET BY MOUTH TWICE DAILY AT 4 AM AND 6 PM ON WORK DAYS AND AT 7 TO 8 AM AND 6 PM ON NON-WORK DAYS, Disp: 60 tablet, Rfl: 3 .  metoprolol (LOPRESSOR) 50 MG tablet, TAKE 1/2 TABLET(25  MG) BY MOUTH TWICE DAILY, Disp: 30 tablet, Rfl: 2 .  Multiple Vitamins-Minerals (OSTEO COMPLEX) CAPS, Take by mouth daily.  , Disp: , Rfl:  .  olmesartan-hydrochlorothiazide (BENICAR HCT) 20-12.5 MG per tablet, Take 1 tablet by mouth daily., Disp: 90 tablet, Rfl: 3 .  ONETOUCH DELICA LANCETS FINE MISC, 1 Stick by Does not apply route daily., Disp: 100 each, Rfl: 3 .  rosuvastatin (CRESTOR) 10 MG tablet, Take 1 tablet (10 mg total) by mouth daily., Disp: 90 tablet, Rfl: 1 .  sitaGLIPtin (JANUVIA) 100 MG tablet, Take 1 tablet (100 mg total) by mouth daily., Disp: 90 tablet, Rfl: 0 .  glimepiride (AMARYL) 2 MG tablet, Take 1 tablet (2 mg total) by mouth daily before breakfast., Disp: 30 tablet, Rfl: 3  EXAM:  Filed Vitals:   01/12/15 0924  BP: 102/60  Pulse: 63  Temp: 98.1 F (36.7 C)    Body mass index is 26.36 kg/(m^2).  GENERAL: vitals reviewed and listed above, alert, oriented, appears well hydrated and in no acute distress  HEENT: atraumatic, conjunttiva clear, no obvious abnormalities on inspection of external  nose and ears  NECK: no obvious masses on inspection  LUNGS: clear to auscultation bilaterally, no wheezes, rales or rhonchi, good air movement  CV: HRRR, no peripheral edema  MS: moves all extremities without noticeable abnormality  PSYCH: pleasant and cooperative, no obvious depression or anxiety  ASSESSMENT AND PLAN:  Discussed the following assessment and plan:  Type 2 diabetes mellitus with diabetic polyneuropathy -see pt insturctions -Patient advised to return or notify a doctor immediately if symptoms worsen or persist or new concerns arise.  Patient Instructions  BEFORE YOU LEAVE: -flu shot -follow up as scheduled  Start amaryl 2 mg daily; continue your other medications  Check blood sugars and keep a written log to bring to your visit: -FASTING: when you first wake up from bed - check daily -POSTPRANDIAL: about 1-2 hours after a meal - check a few times per week 1-2 hours following breakfast and dinner and keep a log  We recommend the following healthy lifestyle measures: - eat a healthy whole foods diet consisting of regular small meals composed of vegetables, fruits, beans, nuts, seeds, healthy meats such as white chicken and fish and whole grains.  - avoid sweets, white starchy foods, fried foods, fast food, processed foods, sodas, red meet and other fattening foods.  - get a least 150-300 minutes of aerobic exercise per week.   Call immediately if any low blood sugars < 70     KIM, [place]

## 2015-01-12 NOTE — Patient Instructions (Addendum)
BEFORE YOU LEAVE: -flu shot -follow up as scheduled  Start amaryl 2 mg daily; continue your other medications  Check blood sugars and keep a written log to bring to your visit: -FASTING: when you first wake up from bed - check daily -POSTPRANDIAL: about 1-2 hours after a meal - check a few times per week 1-2 hours following breakfast and dinner and keep a log  We recommend the following healthy lifestyle measures: - eat a healthy whole foods diet consisting of regular small meals composed of vegetables, fruits, beans, nuts, seeds, healthy meats such as white chicken and fish and whole grains.  - avoid sweets, white starchy foods, fried foods, fast food, processed foods, sodas, red meet and other fattening foods.  - get a least 150-300 minutes of aerobic exercise per week.   Call immediately if any low blood sugars < 70

## 2015-01-23 ENCOUNTER — Other Ambulatory Visit: Payer: Self-pay | Admitting: Family Medicine

## 2015-02-09 ENCOUNTER — Other Ambulatory Visit: Payer: Self-pay | Admitting: Family Medicine

## 2015-02-21 ENCOUNTER — Encounter: Payer: Self-pay | Admitting: Family Medicine

## 2015-02-21 ENCOUNTER — Ambulatory Visit (INDEPENDENT_AMBULATORY_CARE_PROVIDER_SITE_OTHER): Payer: 59 | Admitting: Family Medicine

## 2015-02-21 VITALS — BP 110/60 | HR 69 | Temp 98.0°F | Ht 63.75 in | Wt 151.8 lb

## 2015-02-21 DIAGNOSIS — E1140 Type 2 diabetes mellitus with diabetic neuropathy, unspecified: Secondary | ICD-10-CM | POA: Diagnosis not present

## 2015-02-21 DIAGNOSIS — I10 Essential (primary) hypertension: Secondary | ICD-10-CM

## 2015-02-21 DIAGNOSIS — I2510 Atherosclerotic heart disease of native coronary artery without angina pectoris: Secondary | ICD-10-CM | POA: Diagnosis not present

## 2015-02-21 DIAGNOSIS — E785 Hyperlipidemia, unspecified: Secondary | ICD-10-CM

## 2015-02-21 NOTE — Patient Instructions (Signed)
BEFORE YOU LEAVE: -schedule follow up in February 2017 -come fasting and will plan to do labs then  Please stop smoking  We recommend the following healthy lifestyle measures: - eat a healthy whole foods diet consisting of regular small meals composed of vegetables, fruits, beans, nuts, seeds, healthy meats such as white chicken and fish and whole grains.  - avoid sweets, white starchy foods, fried foods, fast food, processed foods, sodas, red meet and other fattening foods.  - get a least 150-300 minutes of aerobic exercise per week.

## 2015-02-21 NOTE — Progress Notes (Signed)
Pre visit review using our clinic review tool, if applicable. No additional management support is needed unless otherwise documented below in the visit note. 

## 2015-02-21 NOTE — Progress Notes (Signed)
HPI:  DM: -complications: neuropathy  -meds: metformin 1000mg  bid; Januvia 100mg  daily, glimepiride 2mg  daily, asa 81mg  daily, arb -diet and exercise: she has been increasing her exercise - walking daily; she reports she has been working on her diet - has cut out a lot of sugar and carbs - drinking diet drinks -referred to diabetes educator - she did do this -last eye exam - sees Dr. Kennis Carina, 07/2014 -improving diet and reports BS good at home -denies: polyuria, polydipsia, vision loss, wounds on her feet  HLD: -meds: crestor -denies: leg cramps or cog decline  Hx CAD, HTN: -benicar 20-12.5, metoprolol 25mg  bid, asa 81mg  daily, statin -denies: CP, sob, DOE, swelling, palpitations  Hx Seasonal Allergies: -uses allegra d prn  Hx of Smoking: -> 30 pack year -she is using vapor cigarettes but is cutting back -discussed and offered lung cancer screening - declined, continues to decline today  ROS: See pertinent positives and negatives per HPI.  Past Medical History  Diagnosis Date  . Diabetes mellitus   . Hypertension   . Low back pain   . Hyperlipidemia   . CAD (coronary artery disease)     has 3 stents  . Arthritis   . Anemia   . Cataract   . Allergy   . Hx of adenomatous colonic polyps 08/09/2014    Past Surgical History  Procedure Laterality Date  . Cesarean section      2 times  . Knee surgery for torn cartillige      right knee  . Carpal tunnel release both hands     . Breast lumpectomy    . Stents 2001  2001    3 stents  . Rotator cuff surgery      right side  . Triger finger repair      both hands, right hand done twice  . Eye surgery      lens replaced/02/06/15 and 03/09/15  . Stents  2000  . Colonoscopy    . Lasik  01/12/2014, 03/14/2014    Family History  Problem Relation Age of Onset  . Heart disease Mother   . Hypertension Mother   . Heart disease Father   . Heart disease Sister   . Pancreatic cancer Paternal Uncle     Social History    Social History  . Marital Status: Divorced    Spouse Name: N/A  . Number of Children: N/A  . Years of Education: N/A   Social History Main Topics  . Smoking status: Current Some Day Smoker    Types: Cigarettes    Last Attempt to Quit: 06/20/2010  . Smokeless tobacco: Never Used     Comment: smoked 1ppd for> 30 years/smokes occasionally,some vapor cigarettes  . Alcohol Use: No  . Drug Use: No  . Sexual Activity: Yes   Other Topics Concern  . 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  aspirin 81 MG tablet, Take 81 mg by mouth daily.  , Disp: , Rfl:  .  bisacodyl (DULCOLAX) 5 MG EC tablet, Take 5 mg by mouth. Dulcolax 5 mg bowel prep # 4-Take as directed, Disp: , Rfl:  .  glimepiride (AMARYL) 2 MG tablet,  Take 1 tablet (2 mg total) by mouth daily before breakfast., Disp: 30 tablet, Rfl: 3 .  glucose blood (ONE TOUCH ULTRA TEST) test strip, USE TO CHECK BLOOD SUGAR DAILY AND PRN, Disp: 300 each, Rfl: 3 .  JANUVIA 100 MG tablet, TAKE 1 TABLET(100 MG) BY MOUTH DAILY, Disp: 90 tablet, Rfl: 0 .  metFORMIN (GLUCOPHAGE) 1000 MG tablet, TAKE 1 TABLET BY MOUTH TWICE DAILY AT 4AM AND 6PM ON WORK DAYS AND AT 7 TO 8 AM AND 6PM ON NON-WORK DAYS, Disp: 60 tablet, Rfl: 3 .  metoprolol (LOPRESSOR) 50 MG tablet, TAKE 1/2 TABLET(25 MG) BY MOUTH TWICE DAILY, Disp: 30 tablet, Rfl: 2 .  Multiple Vitamins-Minerals (OSTEO COMPLEX) CAPS, Take by mouth daily.  , Disp: , Rfl:  .  olmesartan-hydrochlorothiazide (BENICAR HCT) 20-12.5 MG per tablet, Take 1 tablet by mouth daily., Disp: 90 tablet, Rfl: 3 .  ONETOUCH DELICA LANCETS FINE MISC, 1 Stick by Does not apply route daily., Disp: 100 each, Rfl: 3 .  rosuvastatin (CRESTOR) 10 MG tablet, Take 1 tablet (10 mg total) by mouth daily., Disp: 90  tablet, Rfl: 1  EXAM:  Filed Vitals:   02/21/15 0948  BP: 110/60  Pulse: 69  Temp: 98 F (36.7 C)    Body mass index is 26.27 kg/(m^2).  GENERAL: vitals reviewed and listed above, alert, oriented, appears well hydrated and in no acute distress  HEENT: atraumatic, conjunttiva clear, no obvious abnormalities on inspection of external nose and ears  NECK: no obvious masses on inspection  LUNGS: clear to auscultation bilaterally, no wheezes, rales or rhonchi, good air movement  CV: HRRR, no peripheral edema  MS: moves all extremities without noticeable abnormality  PSYCH: pleasant and cooperative, no obvious depression or anxiety  ASSESSMENT AND PLAN:  Discussed the following assessment and plan:  Type 2 diabetes mellitus with diabetic neuropathy, without long-term current use of insulin (HCC)  Hyperlipemia  Essential hypertension  Atherosclerosis of native coronary artery of native heart without angina pectoris  -lifestyle recs -advised to stop vaping, lung ca screening offered, declined -follow up in 3-4 months with labs then -Patient advised to return or notify a doctor immediately if symptoms worsen or persist or new concerns arise.  Patient Instructions  BEFORE YOU LEAVE: -schedule follow up in February 2017 -come fasting and will plan to do labs then  Please stop smoking  We recommend the following healthy lifestyle measures: - eat a healthy whole foods diet consisting of regular small meals composed of vegetables, fruits, beans, nuts, seeds, healthy meats such as white chicken and fish and whole grains.  - avoid sweets, white starchy foods, fried foods, fast food, processed foods, sodas, red meet and other fattening foods.  - get a least 150-300 minutes of aerobic exercise per week.       Colin Benton R.

## 2015-02-23 ENCOUNTER — Ambulatory Visit: Payer: 59 | Admitting: Family Medicine

## 2015-03-28 ENCOUNTER — Other Ambulatory Visit: Payer: Self-pay | Admitting: Family Medicine

## 2015-05-14 ENCOUNTER — Other Ambulatory Visit: Payer: Self-pay | Admitting: Family Medicine

## 2015-05-23 ENCOUNTER — Other Ambulatory Visit: Payer: Self-pay | Admitting: Family Medicine

## 2015-05-24 ENCOUNTER — Encounter: Payer: Self-pay | Admitting: Family Medicine

## 2015-05-24 ENCOUNTER — Ambulatory Visit (INDEPENDENT_AMBULATORY_CARE_PROVIDER_SITE_OTHER): Payer: 59 | Admitting: Family Medicine

## 2015-05-24 VITALS — BP 100/60 | HR 71 | Temp 97.8°F | Ht 63.75 in | Wt 149.6 lb

## 2015-05-24 DIAGNOSIS — E1140 Type 2 diabetes mellitus with diabetic neuropathy, unspecified: Secondary | ICD-10-CM | POA: Diagnosis not present

## 2015-05-24 DIAGNOSIS — J111 Influenza due to unidentified influenza virus with other respiratory manifestations: Secondary | ICD-10-CM

## 2015-05-24 DIAGNOSIS — E785 Hyperlipidemia, unspecified: Secondary | ICD-10-CM | POA: Diagnosis not present

## 2015-05-24 DIAGNOSIS — I2510 Atherosclerotic heart disease of native coronary artery without angina pectoris: Secondary | ICD-10-CM | POA: Diagnosis not present

## 2015-05-24 DIAGNOSIS — I10 Essential (primary) hypertension: Secondary | ICD-10-CM | POA: Diagnosis not present

## 2015-05-24 NOTE — Progress Notes (Signed)
HPI:  DM: -complications: neuropathy  -meds: metformin 1000mg  bid; Januvia 100mg  daily, glimepiride 2mg  daily, asa 81mg  daily, arb -diet and exercise: she has been increasing her exercise - walking daily; she reports she has been working on her diet - has cut out a lot of sugar and carbs - drinking diet drinks -saw diabetes educator in the past -last eye exam - sees Dr. Kennis Carina, 07/2014 -improving diet and reports BS good at home -denies: polyuria, polydipsia, vision loss, wounds on her feet  HLD: -meds: crestor -denies: leg cramps or cog decline  Hx CAD, HTN: -benicar 20-12.5, metoprolol 25mg  bid, asa 81mg  daily, statin -denies: CP, sob, DOE, swelling, palpitations  Hx Seasonal Allergies: -uses allegra d prn -reports had flu a few weeks go, doing much better but still occ cough which is resolving -denies: SOB, wheezing, persistent fevers, frequent coughing  Hx of Smoking: -> 30 pack year, quit in 2016 -she is using vapor cigarettes but is cutting back -discussed and offered lung cancer screening - declined, continues to decline   ROS: See pertinent positives and negatives per HPI.  Past Medical History  Diagnosis Date  . Diabetes mellitus   . Hypertension   . Low back pain   . Hyperlipidemia   . CAD (coronary artery disease)     has 3 stents  . Arthritis   . Anemia   . Cataract   . Allergy   . Hx of adenomatous colonic polyps 08/09/2014    Past Surgical History  Procedure Laterality Date  . Cesarean section      2 times  . Knee surgery for torn cartillige      right knee  . Carpal tunnel release both hands     . Breast lumpectomy    . Stents 2001  2001    3 stents  . Rotator cuff surgery      right side  . Triger finger repair      both hands, right hand done twice  . Eye surgery      lens replaced/02/06/15 and 03/09/15  . Stents  2000  . Colonoscopy    . Lasik  01/12/2014, 03/14/2014    Family History  Problem Relation Age of Onset  . Heart  disease Mother   . Hypertension Mother   . Heart disease Father   . Heart disease Sister   . Pancreatic cancer Paternal Uncle     Social History   Social History  . Marital Status: Divorced    Spouse Name: N/A  . Number of Children: N/A  . Years of Education: N/A   Social History Main Topics  . Smoking status: Former Smoker    Types: Cigarettes    Quit date: 06/20/2010  . Smokeless tobacco: Never Used     Comment: smoked 1ppd for> 30 years/smokes occasionally,some vapor cigarettes  . Alcohol Use: No  . Drug Use: No  . Sexual Activity: Yes   Other Topics Concern  . 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  aspirin 81 MG tablet, Take 81 mg by mouth daily.  , Disp: , Rfl:  .  bisacodyl (DULCOLAX) 5 MG EC tablet, Take 5 mg by mouth.  Dulcolax 5 mg bowel prep # 4-Take as directed, Disp: , Rfl:  .  glimepiride (AMARYL) 2 MG tablet, TAKE 1 TABLET(2 MG) BY MOUTH DAILY BEFORE BREAKFAST, Disp: 30 tablet, Rfl: 3 .  glucose blood (ONE TOUCH ULTRA TEST) test strip, USE TO CHECK BLOOD SUGAR DAILY AND PRN, Disp: 300 each, Rfl: 3 .  JANUVIA 100 MG tablet, TAKE 1 TABLET(100 MG) BY MOUTH DAILY, Disp: 90 tablet, Rfl: 0 .  metFORMIN (GLUCOPHAGE) 1000 MG tablet, TAKE 1 TABLET BY MOUTH TWICE DAILY AT 4AM AND 6PM ON WORK DAYS AND AT 7 TO 8 AM AND 6PM ON NON-WORK DAYS, Disp: 60 tablet, Rfl: 3 .  metoprolol (LOPRESSOR) 50 MG tablet, TAKE 1/2 TABLET(25 MG) BY MOUTH TWICE DAILY, Disp: 30 tablet, Rfl: 3 .  Multiple Vitamins-Minerals (OSTEO COMPLEX) CAPS, Take by mouth daily.  , Disp: , Rfl:  .  olmesartan-hydrochlorothiazide (BENICAR HCT) 20-12.5 MG per tablet, Take 1 tablet by mouth daily., Disp: 90 tablet, Rfl: 3 .  ONETOUCH DELICA LANCETS FINE MISC, 1 Stick by Does not apply route  daily., Disp: 100 each, Rfl: 3 .  rosuvastatin (CRESTOR) 10 MG tablet, Take 1 tablet (10 mg total) by mouth daily., Disp: 90 tablet, Rfl: 1  EXAM:  Filed Vitals:   05/24/15 0808  BP: 100/60  Pulse: 71  Temp: 97.8 F (36.6 C)    Body mass index is 25.89 kg/(m^2).  GENERAL: vitals reviewed and listed above, alert, oriented, appears well hydrated and in no acute distress  HEENT: atraumatic, conjunttiva clear, no obvious abnormalities on inspection of external nose and ears  NECK: no obvious masses on inspection  LUNGS: clear to auscultation bilaterally, no wheezes, rales or rhonchi, good air movement  CV: HRRR, no peripheral edema  MS: moves all extremities without noticeable abnormality  PSYCH: pleasant and cooperative, no obvious depression or anxiety  ASSESSMENT AND PLAN:  Discussed the following assessment and plan:  Type 2 diabetes mellitus with diabetic neuropathy, without long-term current use of insulin (HCC) - Plan: Hemoglobin A1c -labs today -lifestyle recs -continue medications  Hyperlipemia -not fasting today, will do lipid check next visit -cont current treatment  Essential hypertension - Plan: Basic metabolic panel -cont current treatment  Atherosclerosis of native coronary artery of native heart without angina pectoris -stable  Influenza -improving, likely mild residual post viral cough - advised to return if persist in 1-2 weeks or any worsening  -Patient advised to return or notify a doctor immediately if symptoms worsen or persist or new concerns arise.  Patient Instructions  BEFORE YOU LEAVE: -schedule follow up in 3-4 months - come FASTING for 8 hours for cholesterol check but please drink plenty of water -labs  -We have ordered labs or studies at this visit. It can take up to 1-2 weeks for results and processing. We will contact you with instructions IF your results are abnormal. Normal results will be released to your [facility] If you have  not heard from [place] or can not find your results in [facility] in 2 weeks please contact our office.  We recommend the following healthy lifestyle measures: - eat a healthy whole foods diet consisting of regular small meals composed of vegetables, fruits, beans, nuts, seeds, healthy meats such as white chicken and fish and whole grains.  - avoid sweets, white starchy foods, fried foods, fast food, processed foods, sodas, red meet and other fattening foods.  - get a least 150-300 minutes of aerobic exercise per week.   Follow up if cough  persist in 1 -2 weeks or if worsens          Pius Byrom R.

## 2015-05-24 NOTE — Patient Instructions (Signed)
BEFORE YOU LEAVE: -schedule follow up in 3-4 months - come FASTING for 8 hours for cholesterol check but please drink plenty of water -labs  -We have ordered labs or studies at this visit. It can take up to 1-2 weeks for results and processing. We will contact you with instructions IF your results are abnormal. Normal results will be released to your [facility]. If you have not heard from [place] or can not find your results in [facility] in 2 weeks please contact our office.  We recommend the following healthy lifestyle measures: - eat a healthy whole foods diet consisting of regular small meals composed of vegetables, fruits, beans, nuts, seeds, healthy meats such as white chicken and fish and whole grains.  - avoid sweets, white starchy foods, fried foods, fast food, processed foods, sodas, red meet and other fattening foods.  - get a least 150-300 minutes of aerobic exercise per week.   Follow up if cough persist in 1 -2 weeks or if worsens

## 2015-05-24 NOTE — Progress Notes (Signed)
Pre visit review using our clinic review tool, if applicable. No additional management support is needed unless otherwise documented below in the visit note. 

## 2015-05-25 LAB — BASIC METABOLIC PANEL
BUN: 27 mg/dL — AB (ref 6–23)
CALCIUM: 10.2 mg/dL (ref 8.4–10.5)
CHLORIDE: 101 meq/L (ref 96–112)
CO2: 26 meq/L (ref 19–32)
CREATININE: 0.99 mg/dL (ref 0.40–1.20)
GFR: 60.43 mL/min (ref >60.00)
Glucose, Bld: 84 mg/dL (ref 70–99)
Potassium: 4.6 mEq/L (ref 3.5–5.1)
Sodium: 137 mEq/L (ref 135–145)

## 2015-05-25 LAB — LDL CHOLESTEROL, DIRECT: LDL DIRECT: 62 mg/dL

## 2015-05-25 LAB — HEMOGLOBIN A1C: HEMOGLOBIN A1C: 6.4 % (ref 4.6–6.5)

## 2015-05-25 LAB — LIPID PANEL
CHOLESTEROL: 122 mg/dL (ref 0–200)
HDL: 30.2 mg/dL — AB (ref >39.00)
NonHDL: 91.37
Total CHOL/HDL Ratio: 4
Triglycerides: 283 mg/dL — ABNORMAL HIGH (ref 0.0–149.0)
VLDL: 56.6 mg/dL — AB (ref 0.0–40.0)

## 2015-05-25 NOTE — Addendum Note (Signed)
Addended by: Gari Crown D on: 05/25/2015 08:33 AM   Modules accepted: Orders

## 2015-05-30 ENCOUNTER — Other Ambulatory Visit: Payer: Self-pay | Admitting: Family Medicine

## 2015-06-01 ENCOUNTER — Other Ambulatory Visit: Payer: Self-pay | Admitting: Family Medicine

## 2015-07-27 ENCOUNTER — Other Ambulatory Visit: Payer: Self-pay | Admitting: Family Medicine

## 2015-08-14 ENCOUNTER — Other Ambulatory Visit: Payer: Self-pay | Admitting: Family Medicine

## 2015-08-29 ENCOUNTER — Other Ambulatory Visit: Payer: Self-pay | Admitting: Family Medicine

## 2015-09-14 ENCOUNTER — Other Ambulatory Visit: Payer: Self-pay | Admitting: Family Medicine

## 2015-09-20 ENCOUNTER — Ambulatory Visit (INDEPENDENT_AMBULATORY_CARE_PROVIDER_SITE_OTHER): Payer: 59 | Admitting: Family Medicine

## 2015-09-20 ENCOUNTER — Encounter: Payer: Self-pay | Admitting: Family Medicine

## 2015-09-20 VITALS — BP 130/58 | HR 70 | Temp 97.8°F | Ht 63.75 in | Wt 155.3 lb

## 2015-09-20 DIAGNOSIS — I10 Essential (primary) hypertension: Secondary | ICD-10-CM

## 2015-09-20 DIAGNOSIS — E785 Hyperlipidemia, unspecified: Secondary | ICD-10-CM | POA: Diagnosis not present

## 2015-09-20 DIAGNOSIS — E1140 Type 2 diabetes mellitus with diabetic neuropathy, unspecified: Secondary | ICD-10-CM | POA: Diagnosis not present

## 2015-09-20 DIAGNOSIS — I2510 Atherosclerotic heart disease of native coronary artery without angina pectoris: Secondary | ICD-10-CM | POA: Diagnosis not present

## 2015-09-20 NOTE — Patient Instructions (Signed)
BEFORE YOU LEAVE: -physical exam in 3 months; come fasting if possible  We recommend the following healthy lifestyle measures: - eat a healthy whole foods diet consisting of regular small meals composed of vegetables, fruits, beans, nuts, seeds, healthy meats such as white chicken and fish and whole grains.  - avoid sweets, white starchy foods, fried foods, fast food, processed foods, sodas, red meet and other fattening foods.  - get a least 150-300 minutes of aerobic exercise per week.   Keep nails trim and filed, apply antifungal cream twice daily.

## 2015-09-20 NOTE — Progress Notes (Signed)
HPI:  DM: -complications: neuropathy  -meds: metformin 1000mg  bid; Januvia 100mg  daily, glimepiride 2mg  daily, asa 81mg  daily, arb -diet and exercise: some walking; she feels diet is good -last eye exam - sees Dr. Kennis Carina, 07/2014; she reports she has follow up son -reports BS good at home -denies: polyuria, polydipsia, vision loss, wounds on her feet  HLD: -meds: crestor -denies: leg cramps or cog decline  Hx CAD, HTN: -benicar 20-12.5, metoprolol 25mg  bid, asa 81mg  daily, statin -denies: CP, sob, DOE, swelling, palpitations  Hx of Smoking: -> 30 pack year, quit in 2016 -she is using vapor cigarettes -discussed and offered lung cancer screening - declined, continues to decline   ROS: See pertinent positives and negatives per HPI.  Past Medical History  Diagnosis Date  . Diabetes mellitus   . Hypertension   . Low back pain   . Hyperlipidemia   . CAD (coronary artery disease)     has 3 stents  . Arthritis   . Anemia   . Cataract   . Allergy   . Hx of adenomatous colonic polyps 08/09/2014    Past Surgical History  Procedure Laterality Date  . Cesarean section      2 times  . Knee surgery for torn cartillige      right knee  . Carpal tunnel release both hands     . Breast lumpectomy    . Stents 2001  2001    3 stents  . Rotator cuff surgery      right side  . Triger finger repair      both hands, right hand done twice  . Eye surgery      lens replaced/02/06/15 and 03/09/15  . Stents  2000  . Colonoscopy    . Lasik  01/12/2014, 03/14/2014    Family History  Problem Relation Age of Onset  . Heart disease Mother   . Hypertension Mother   . Heart disease Father   . Heart disease Sister   . Pancreatic cancer Paternal Uncle     Social History   Social History  . Marital Status: Divorced    Spouse Name: N/A  . Number of Children: N/A  . Years of Education: N/A   Social History Main Topics  . Smoking status: Former Smoker    Types: Cigarettes     Quit date: 06/20/2010  . Smokeless tobacco: Never Used     Comment: smoked 1ppd for> 30 years/smokes occasionally,some vapor cigarettes  . Alcohol Use: No  . Drug Use: No  . Sexual Activity: Yes   Other Topics Concern  . 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  aspirin 81 MG tablet, Take 81 mg by mouth daily.  , Disp: , Rfl:  .  BENICAR HCT 20-12.5 MG tablet, TAKE 1 TABLET BY MOUTH DAILY, Disp: 90 tablet, Rfl: 2 .  glimepiride (AMARYL) 2 MG tablet, TAKE 1 TABLET(2 MG) BY MOUTH DAILY BEFORE BREAKFAST, Disp: 30 tablet, Rfl: 5 .  glucose blood (ONE TOUCH ULTRA TEST) test strip, USE TO CHECK BLOOD SUGAR DAILY AND PRN, Disp: 300 each, Rfl: 3 .  JANUVIA 100 MG tablet, TAKE 1 TABLET(100 MG) BY MOUTH DAILY, Disp:  90 tablet, Rfl: 0 .  metFORMIN (GLUCOPHAGE) 1000 MG tablet, TAKE 1 TABLET BY MOUTH TWICE DAILY AT 4AM AND 6PM ON WORK DAYS AND AT 7 TO 8 AM AND 6PM ON NON-WORK DAYS, Disp: 60 tablet, Rfl: 3 .  metoprolol (LOPRESSOR) 50 MG tablet, TAKE 1/2 TABLET(25 MG) BY MOUTH TWICE DAILY, Disp: 30 tablet, Rfl: 11 .  Multiple Vitamins-Minerals (OSTEO COMPLEX) CAPS, Take by mouth daily.  , Disp: , Rfl:  .  ONETOUCH DELICA LANCETS FINE MISC, 1 Stick by Does not apply route daily., Disp: 100 each, Rfl: 3 .  rosuvastatin (CRESTOR) 10 MG tablet, TAKE 1 TABLET(10 MG) BY MOUTH DAILY, Disp: 90 tablet, Rfl: 0  EXAM:  Filed Vitals:   09/20/15 0757  BP: 130/58  Pulse: 70  Temp: 97.8 F (36.6 C)    Body mass index is 26.88 kg/(m^2).  GENERAL: vitals reviewed and listed above, alert, oriented, appears well hydrated and in no acute distress  HEENT: atraumatic, conjunttiva clear, no obvious abnormalities on inspection of external nose and ears  NECK: no obvious masses on  inspection  LUNGS: clear to auscultation bilaterally, no wheezes, rales or rhonchi, good air movement  CV: HRRR, no peripheral edema  MS: moves all extremities without noticeable abnormality  PSYCH: pleasant and cooperative, no obvious depression or anxiety  ASSESSMENT AND PLAN:  Discussed the following assessment and plan:  Type 2 diabetes mellitus with diabetic neuropathy, without long-term current use of insulin (HCC)  Hyperlipemia  Essential hypertension  Atherosclerosis of native coronary artery of native heart without angina pectoris  -foot exam -advised eye exam -lifestyle recs -she wants to do conservative tx for thickened nails -labs at CPE -Patient advised to return or notify a doctor immediately if symptoms worsen or persist or new concerns arise.  Patient Instructions  BEFORE YOU LEAVE: -physical exam in 3 months; come fasting if possible  We recommend the following healthy lifestyle measures: - eat a healthy whole foods diet consisting of regular small meals composed of vegetables, fruits, beans, nuts, seeds, healthy meats such as white chicken and fish and whole grains.  - avoid sweets, white starchy foods, fried foods, fast food, processed foods, sodas, red meet and other fattening foods.  - get a least 150-300 minutes of aerobic exercise per week.   Keep nails trim and filed, apply antifungal cream twice daily.     Colin Benton R.

## 2015-09-20 NOTE — Progress Notes (Signed)
Pre visit review using our clinic review tool, if applicable. No additional management support is needed unless otherwise documented below in the visit note. 

## 2015-09-21 ENCOUNTER — Ambulatory Visit: Payer: 59 | Admitting: Family Medicine

## 2015-10-01 ENCOUNTER — Other Ambulatory Visit: Payer: Self-pay | Admitting: Family Medicine

## 2015-11-11 ENCOUNTER — Other Ambulatory Visit: Payer: Self-pay | Admitting: Family Medicine

## 2015-11-12 ENCOUNTER — Other Ambulatory Visit: Payer: Self-pay | Admitting: Family Medicine

## 2015-12-26 NOTE — Progress Notes (Signed)
HPI:  Here for CPE:  -Concerns and/or follow up today:  DM: -complications: neuropathy  -meds: metformin 1000mg  bid; Januvia 100mg  daily, glimepiride 2mg  daily, asa 81mg  daily, arb -diet and exercise: lots of walking at work; she feels diet is ok -last eye exam - sees Dr. Kennis Carina, reports today that she will schedule eye exam -reports BS good at home -denies: polyuria, polydipsia, vision loss, wounds on her feet  HLD: -meds: crestor -denies: leg cramps or cog decline  Hx CAD, HTN: -benicar 20-12.5, metoprolol 25mg  bid, asa 81mg  daily, statin -denies: CP, sob, DOE, swelling, palpitations  Hx of Smoking: -> 30 pack year, quit in 2016 -she is using vapor cigarettes rarely -discussed and offered lung cancer screening - declined, continues to decline   -Taking folic acid, vitamin D or calcium: Yes to vit D  -Diabetes and Dyslipidemia Screening: FASTING for labs  -Vaccines: flu? Yes; Shingles? Declined  -pap history: 2016  -FDLMP: n/a  -sexual activity: yes, female partner, no new partners  -wants STI testing (Hep C if born 50-65): no  -FH breast, colon or ovarian ca: see FH Last mammogram: 05/2014; unilat 12/2014 (annual screening advised - [facility]) - she reports has number and will call herself Last colon cancer screening: 07/2014, polyps with repeat advised in 2019  Breast Ca Risk Assessment: -see family and personal hx, discussed  -Alcohol, Tobacco, drug use: see social history  Review of Systems - no fevers, unintentional weight loss, vision loss, hearing loss, chest pain, sob, hemoptysis, melena, hematochezia, hematuria, genital discharge, changing or concerning skin lesions, bleeding, bruising, loc, thoughts of self harm or SI  Past Medical History:  Diagnosis Date  . Allergy   . Anemia   . Arthritis   . CAD (coronary artery disease)    has 3 stents  . Cataract   . Diabetes mellitus   . Hx of adenomatous colonic polyps 08/09/2014  . Hyperlipidemia   .  Hypertension   . Low back pain     Past Surgical History:  Procedure Laterality Date  . BREAST LUMPECTOMY    . carpal tunnel release both hands     . CESAREAN SECTION     2 times  . COLONOSCOPY    . EYE SURGERY     lens replaced/02/06/15 and 03/09/15  . knee surgery for torn cartillige     right knee  . LASIK  01/12/2014, 03/14/2014  . rotator cuff surgery     right side  . stents  2000  . stents 2001  2001   3 stents  . triger finger repair     both hands, right hand done twice    Family History  Problem Relation Age of Onset  . Heart disease Mother   . Hypertension Mother   . Heart disease Father   . Heart disease Sister   . Pancreatic cancer Paternal Uncle     Social History   Social History  . Marital status: Divorced    Spouse name: N/A  . Number of children: N/A  . Years of education: N/A   Social History Main Topics  . Smoking status: Former Smoker    Types: E-cigarettes    Quit date: 06/20/2010  . Smokeless tobacco: Never Used     Comment: smoked 1ppd for> 30 years/smokes occasionally,some vapor cigarettes  . Alcohol use No  . Drug use: No  . Sexual activity: Yes   Other Topics Concern  . 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  aspirin 81 MG tablet, Take 81 mg by mouth daily.  , Disp: , Rfl:  .  BENICAR HCT 20-12.5 MG tablet, TAKE 1 TABLET BY MOUTH DAILY, Disp: 90 tablet, Rfl: 2 .  glimepiride (AMARYL) 2 MG tablet, TAKE 1 TABLET(2 MG) BY MOUTH DAILY BEFORE BREAKFAST, Disp: 30 tablet, Rfl: 5 .  glucose blood (ONE TOUCH ULTRA TEST) test strip, USE TO CHECK BLOOD SUGAR DAILY AND PRN, Disp: 300 each, Rfl: 3 .  JANUVIA 100 MG tablet, TAKE 1 TABLET(100 MG) BY MOUTH DAILY, Disp: 90 tablet, Rfl: 1 .  metFORMIN (GLUCOPHAGE) 1000 MG tablet,  TAKE 1 TABLET BY MOUTH TWICE DAILY AT 4AM AND 6PM ON WORK DAYS AND AT 7 TO 8 AM AND 6PM ON NON-WORK DAYS, Disp: 60 tablet, Rfl: 3 .  metoprolol (LOPRESSOR) 50 MG tablet, TAKE 1/2 TABLET(25 MG) BY MOUTH TWICE DAILY, Disp: 30 tablet, Rfl: 11 .  Multiple Vitamins-Minerals (OSTEO COMPLEX) CAPS, Take by mouth daily.  , Disp: , Rfl:  .  ONETOUCH DELICA LANCETS FINE MISC, 1 Stick by Does not apply route daily., Disp: 100 each, Rfl: 3 .  rosuvastatin (CRESTOR) 10 MG tablet, TAKE 1 TABLET(10 MG) BY MOUTH DAILY, Disp: 90 tablet, Rfl: 3  EXAM:  Vitals:   12/27/15 0655  BP: (!) 108/58  Pulse: 64  Temp: 97.5 F (36.4 C)    GENERAL: vitals reviewed and listed below, alert, oriented, appears well hydrated and in no acute distress  HEENT: head atraumatic, PERRLA, normal appearance of eyes, ears, nose and mouth. moist mucus membranes.  NECK: supple, no masses or lymphadenopathy  LUNGS: clear to auscultation bilaterally, no rales, rhonchi or wheeze  CV: HRRR, no peripheral edema or cyanosis, normal pedal pulses  BREAST: declined, reports does when get mammogram  ABDOMEN: bowel sounds normal, soft, non tender to palpation, no masses, no rebound or guarding  GU: declined  SKIN: no rash or abnormal lesions, declined full skin exam  MS: normal gait, moves all extremities normally  NEURO: normal gait, speech and thought processing grossly intact, muscle tone grossly intact throughout  PSYCH: normal affect, pleasant and cooperative  ASSESSMENT AND PLAN:  Discussed the following assessment and plan:  Visit for preventive health examination  Hyperlipemia - Plan: Lipid Panel  Essential hypertension - Plan: Basic metabolic panel, CBC (no diff)  Atherosclerosis of native coronary artery of native heart without angina pectoris  Type 2 diabetes mellitus with diabetic neuropathy, without long-term current use of insulin (HCC) - Plan: Hemoglobin A1c  -Discussed and advised all [place] preventive  services health task force level A and B recommendations for age, sex and risks.  -Advised at least 150 minutes of exercise per week and a healthy diet with avoidance of (less then 1 serving per week) processed foods, white starches, red meat, fast foods and sweets and consisting of: * 5-9 servings of fresh fruits and vegetables (not corn or potatoes) *nuts and seeds, beans *olives and olive oil *lean meats such as fish and white chicken  *whole grains  -FASTING labs, studies and vaccines per orders this encounter  Orders Placed This Encounter  Procedures  . Hemoglobin A1c  . Basic metabolic panel  . CBC (no  diff)  . Lipid Panel    Patient advised to return to clinic immediately if symptoms worsen or persist or new concerns.  Patient Instructions  BEFORE YOU LEAVE: -flu shot -follow up: 3-4 months -labs  Call [facility] today to set up our yearly mammogram.  Call your eye doctor today to schedule your diabetic eye exam.  We have ordered labs or studies at this visit. It can take up to 1-2 weeks for results and processing. IF results require follow up or explanation, we will call you with instructions. Clinically stable results will be released to your [facility] If you have not heard from [place] or cannot find your results in [facility] in 2 weeks please contact our office at [phone].  If you are not yet signed up for [facility], please consider signing up.   We recommend the following healthy lifestyle for LIFE: 1) Small portions.   Tip: eat off of a salad plate instead of a dinner plate.  Tip: It is ok to feel hungry after a meal - that likely means you ate an appropriate portion.  Tip: if you need more or a snack choose fruits, veggies and/or a handful of nuts or seeds.  2) Eat a healthy clean diet.  * Tip: Avoid (less then 1 serving per week): processed foods, sweets, sweetened drinks, white starches (rice, flour, bread, potatoes, pasta, etc), red meat, fast foods, butter  *Tip:  CHOOSE instead   * 5-9 servings per day of fresh or frozen fruits and vegetables (but not corn, potatoes, bananas, canned or dried fruit)   *nuts and seeds, beans   *olives and olive oil   *small portions of lean meats such as fish and white chicken    *small portions of whole grains  3)Get at least 150 minutes of sweaty aerobic exercise per week.  4)Reduce stress - consider counseling, meditation and relaxation to balance other aspects of your life.          No Follow-up on file.  Colin Benton R., DO

## 2015-12-27 ENCOUNTER — Ambulatory Visit (INDEPENDENT_AMBULATORY_CARE_PROVIDER_SITE_OTHER): Payer: 59 | Admitting: Family Medicine

## 2015-12-27 ENCOUNTER — Encounter: Payer: Self-pay | Admitting: Family Medicine

## 2015-12-27 VITALS — BP 108/58 | HR 64 | Temp 97.5°F | Ht 63.25 in | Wt 156.0 lb

## 2015-12-27 DIAGNOSIS — E785 Hyperlipidemia, unspecified: Secondary | ICD-10-CM

## 2015-12-27 DIAGNOSIS — Z0000 Encounter for general adult medical examination without abnormal findings: Secondary | ICD-10-CM | POA: Diagnosis not present

## 2015-12-27 DIAGNOSIS — I2510 Atherosclerotic heart disease of native coronary artery without angina pectoris: Secondary | ICD-10-CM | POA: Diagnosis not present

## 2015-12-27 DIAGNOSIS — Z23 Encounter for immunization: Secondary | ICD-10-CM | POA: Diagnosis not present

## 2015-12-27 DIAGNOSIS — E1140 Type 2 diabetes mellitus with diabetic neuropathy, unspecified: Secondary | ICD-10-CM

## 2015-12-27 DIAGNOSIS — I10 Essential (primary) hypertension: Secondary | ICD-10-CM | POA: Diagnosis not present

## 2015-12-27 LAB — BASIC METABOLIC PANEL
BUN: 16 mg/dL (ref 6–23)
CHLORIDE: 102 meq/L (ref 96–112)
CO2: 26 mEq/L (ref 19–32)
CREATININE: 0.83 mg/dL (ref 0.40–1.20)
Calcium: 9.5 mg/dL (ref 8.4–10.5)
GFR: 73.92 mL/min (ref >60.00)
Glucose, Bld: 105 mg/dL — ABNORMAL HIGH (ref 70–99)
Potassium: 3.8 mEq/L (ref 3.5–5.1)
Sodium: 138 mEq/L (ref 135–145)

## 2015-12-27 LAB — CBC
HEMATOCRIT: 36.8 % (ref 36.0–46.0)
Hemoglobin: 12.7 g/dL (ref 12.0–15.0)
MCHC: 34.6 g/dL (ref 30.0–36.0)
MCV: 93.7 fl (ref 78.0–100.0)
Platelets: 256 10*3/uL (ref 150.0–400.0)
RBC: 3.93 Mil/uL (ref 3.87–5.11)
RDW: 14.5 % (ref 11.5–15.5)
WBC: 8 10*3/uL (ref 4.0–10.5)

## 2015-12-27 LAB — LIPID PANEL
CHOLESTEROL: 124 mg/dL (ref 0–200)
HDL: 29.9 mg/dL — ABNORMAL LOW (ref >39.00)
NonHDL: 93.95
Total CHOL/HDL Ratio: 4
Triglycerides: 206 mg/dL — ABNORMAL HIGH (ref 0.0–149.0)
VLDL: 41.2 mg/dL — ABNORMAL HIGH (ref 0.0–40.0)

## 2015-12-27 LAB — HEMOGLOBIN A1C: Hgb A1c MFr Bld: 7 % — ABNORMAL HIGH (ref 4.6–6.5)

## 2015-12-27 LAB — LDL CHOLESTEROL, DIRECT: LDL DIRECT: 66 mg/dL

## 2015-12-27 NOTE — Patient Instructions (Signed)
BEFORE YOU LEAVE: -flu shot -follow up: 3-4 months -labs  Call [facility] today to set up our yearly mammogram.  Call your eye doctor today to schedule your diabetic eye exam.  We have ordered labs or studies at this visit. It can take up to 1-2 weeks for results and processing. IF results require follow up or explanation, we will call you with instructions. Clinically stable results will be released to your [facility]. If you have not heard from [place] or cannot find your results in [facility] in 2 weeks please contact our office at [phone].  If you are not yet signed up for [facility], please consider signing up.   We recommend the following healthy lifestyle for LIFE: 1) Small portions.   Tip: eat off of a salad plate instead of a dinner plate.  Tip: It is ok to feel hungry after a meal - that likely means you ate an appropriate portion.  Tip: if you need more or a snack choose fruits, veggies and/or a handful of nuts or seeds.  2) Eat a healthy clean diet.  * Tip: Avoid (less then 1 serving per week): processed foods, sweets, sweetened drinks, white starches (rice, flour, bread, potatoes, pasta, etc), red meat, fast foods, butter  *Tip: CHOOSE instead   * 5-9 servings per day of fresh or frozen fruits and vegetables (but not corn, potatoes, bananas, canned or dried fruit)   *nuts and seeds, beans   *olives and olive oil   *small portions of lean meats such as fish and white chicken    *small portions of whole grains  3)Get at least 150 minutes of sweaty aerobic exercise per week.  4)Reduce stress - consider counseling, meditation and relaxation to balance other aspects of your life.

## 2015-12-27 NOTE — Progress Notes (Signed)
Pre visit review using our clinic review tool, if applicable. No additional management support is needed unless otherwise documented below in the visit note. 

## 2016-02-04 ENCOUNTER — Other Ambulatory Visit: Payer: Self-pay | Admitting: Family Medicine

## 2016-02-27 ENCOUNTER — Other Ambulatory Visit: Payer: Self-pay | Admitting: Family Medicine

## 2016-03-19 ENCOUNTER — Other Ambulatory Visit: Payer: Self-pay | Admitting: Family Medicine

## 2016-04-23 DIAGNOSIS — Z1231 Encounter for screening mammogram for malignant neoplasm of breast: Secondary | ICD-10-CM | POA: Diagnosis not present

## 2016-04-23 LAB — HM MAMMOGRAPHY

## 2016-04-24 ENCOUNTER — Encounter: Payer: Self-pay | Admitting: Family Medicine

## 2016-04-28 ENCOUNTER — Ambulatory Visit: Payer: 59 | Admitting: Family Medicine

## 2016-05-05 ENCOUNTER — Telehealth: Payer: Self-pay

## 2016-05-05 NOTE — Telephone Encounter (Signed)
If she is ok with trying alt - advise losartan-hctz 50-12.5 with follow up as scheduled to check BP then. Please send new rx if she is agreeable. Thanks.

## 2016-05-05 NOTE — Telephone Encounter (Signed)
Alternatives are: candesartan-hydrochlorothiazide, irbesartan-hydrochlorothiazide, losartan-hydrochlorothiazide, olmesartan-hydrochlorothiazide, telmisartan-hydrochlorothiazide, valsartan-hydrochlorothiazide  I didn't see where she's tried more than one of these.

## 2016-05-05 NOTE — Telephone Encounter (Signed)
Received PA request from [facility] for Benicar HCT 20-12.5 tablets. PA submitted & is pending. Key: [id number]

## 2016-05-06 MED ORDER — LOSARTAN POTASSIUM-HCTZ 50-12.5 MG PO TABS: 1.0000 | ORAL_TABLET | Freq: Every day | ORAL | 1 refills | Status: DC

## 2016-05-06 NOTE — Telephone Encounter (Signed)
Patient called back and stated she will try the alternate medication and she is aware this was sent to her pharmacy.

## 2016-05-06 NOTE — Addendum Note (Signed)
Addended by: Agnes Lawrence on: 05/06/2016 08:16 AM   Modules accepted: Orders

## 2016-05-06 NOTE — Telephone Encounter (Signed)
I left a message for the pt to return my call. 

## 2016-05-15 DIAGNOSIS — Z961 Presence of intraocular lens: Secondary | ICD-10-CM | POA: Diagnosis not present

## 2016-05-15 DIAGNOSIS — E119 Type 2 diabetes mellitus without complications: Secondary | ICD-10-CM | POA: Diagnosis not present

## 2016-05-25 NOTE — Progress Notes (Signed)
HPI:  PMH DM, HTN, CAD, HLD, osteoarthritis, hx smoking (quit in 2016). Declined lung cancer screening in the past. She is confused about her blood pressure medications. Insurance stopped [employer]. She picked up ne med and stopped lopressor - may be taking 2 combos? Feels fine no dizziness or HA.  No hypoglycemia, SOB, DOE.  Due for labs. Reports had eye exam 05/15/16 with Dr. Corrin Parker. Reports no diabetic retinopathy.  ROS: See pertinent positives and negatives per HPI.  Past Medical History:  Diagnosis Date  . Allergy   . Anemia   . Arthritis   . CAD (coronary artery disease)    has 3 stents  . Cataract   . Diabetes mellitus   . Hx of adenomatous colonic polyps 08/09/2014  . Hyperlipidemia   . Hypertension   . Low back pain     Past Surgical History:  Procedure Laterality Date  . BREAST LUMPECTOMY    . carpal tunnel release both hands     . CESAREAN SECTION     2 times  . COLONOSCOPY    . EYE SURGERY     lens replaced/02/06/15 and 03/09/15  . knee surgery for torn cartillige     right knee  . LASIK  01/12/2014, 03/14/2014  . rotator cuff surgery     right side  . stents  2000  . stents 2001  2001   3 stents  . triger finger repair     both hands, right hand done twice    Family History  Problem Relation Age of Onset  . Heart disease Mother   . Hypertension Mother   . Heart disease Father   . Heart disease Sister   . Pancreatic cancer Paternal Uncle     Social History   Social History  . Marital status: Divorced    Spouse name: N/A  . Number of children: N/A  . Years of education: N/A   Social History Main Topics  . Smoking status: Former Smoker    Types: E-cigarettes    Quit date: 06/20/2010  . Smokeless tobacco: Never Used     Comment: smoked 1ppd for> 30 years/smokes occasionally,some vapor cigarettes  . Alcohol use No  . Drug use: No  . Sexual activity: Yes   Other Topics Concern  . 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  aspirin 81 MG tablet, Take 81 mg by mouth daily.  , Disp: , Rfl:  .  glimepiride (AMARYL) 2 MG tablet, TAKE 1 TABLET(2 MG) BY MOUTH DAILY BEFORE BREAKFAST, Disp: 30 tablet, Rfl: 3 .  glucose blood (ONE TOUCH ULTRA TEST) test strip, USE TO CHECK BLOOD SUGAR DAILY AND PRN, Disp: 300 each, Rfl: 3 .  JANUVIA 100 MG tablet, TAKE 1 TABLET(100 MG) BY MOUTH DAILY, Disp: 90 tablet, Rfl: 1 .  losartan-hydrochlorothiazide (HYZAAR) 50-12.5 MG tablet, Take 1 tablet by mouth daily., Disp: 90 tablet, Rfl: 1 .  metFORMIN (GLUCOPHAGE) 1000 MG tablet, TAKE 1 TABLET BY MOUTH TWICE DAILY AT 4AM AND 6PM ON WORK DAYS AND AT 7 TO 8 AM AND 6PM ON NON-WORK DAYS, Disp: 60 tablet, Rfl: 5 .  metoprolol (LOPRESSOR) 50 MG tablet, TAKE 1/2  TABLET(25 MG) BY MOUTH TWICE DAILY, Disp: 30 tablet, Rfl: 11 .  Multiple Vitamins-Minerals (OSTEO COMPLEX) CAPS, Take by mouth daily.  , Disp: , Rfl:  .  ONETOUCH DELICA LANCETS FINE MISC, 1 Stick by Does not apply route daily., Disp: 100 each, Rfl: 3 .  rosuvastatin (CRESTOR) 10 MG tablet, TAKE 1 TABLET(10 MG) BY MOUTH DAILY, Disp: 90 tablet, Rfl: 3  EXAM:  Vitals:   05/26/16 0920  BP: (!) 118/50  Pulse: 76  Temp: 97.8 F (36.6 C)    Body mass index is 27.54 kg/m.  GENERAL: vitals reviewed and listed above, alert, oriented, appears well hydrated and in no acute distress  HEENT: atraumatic, conjunttiva clear, no obvious abnormalities on inspection of external nose and ears  NECK: no obvious masses on inspection  LUNGS: clear to auscultation bilaterally, no wheezes, rales or rhonchi, good air movement  CV: HRRR, no peripheral edema  MS: moves all extremities without noticeable abnormality  PSYCH: pleasant and cooperative, no obvious depression or anxiety  ASSESSMENT  AND PLAN:  Discussed the following assessment and plan:  Hyperlipidemia, unspecified hyperlipidemia type  Essential hypertension - Plan: Basic metabolic panel, CBC  Atherosclerosis of native coronary artery of native heart without angina pectoris  Type 2 diabetes mellitus with diabetic neuropathy, without long-term current use of insulin ([place]) - Plan: Hemoglobin A1c  -clarified med list - seems may be taking two similar BP medications! Reviewed and printed BP med list. -labs -lifestyle recs -follow up 3 months -Patient advised to return or notify a doctor immediately if symptoms worsen or persist or new concerns arise.  Patient Instructions  BEFORE YOU LEAVE: -labs -follow up: 3 months  MAKE sure you are taking the losartan-hctz (hyzaar). Make sure you STOPPED the [facility].  We have ordered labs or studies at this visit. It can take up to 1-2 weeks for results and processing. IF results require follow up or explanation, we will call you with instructions. Clinically stable results will be released to your [facility] If you have not heard from [place] or cannot find your results in [facility] in 2 weeks please contact our office at [phone].  If you are not yet signed up for [facility], please consider signing up.   We recommend the following healthy lifestyle for LIFE: 1) Small portions.   Tip: eat off of a salad plate instead of a dinner plate.  Tip: if you need more or a snack choose fruits, veggies and/or a handful of nuts or seeds.  2) Eat a healthy clean diet.  * Tip: Avoid (less then 1 serving per week): processed foods, sweets, sweetened drinks, white starches (rice, flour, bread, potatoes, pasta, etc), red meat, fast foods, butter  *Tip: CHOOSE instead   * 5-9 servings per day of fresh or frozen fruits and vegetables (but not corn, potatoes, bananas, canned or dried fruit)   *nuts and seeds, beans   *olives and olive oil   *small portions of lean meats such as fish and white  chicken    *small portions of whole grains  3)Get at least 150 minutes of sweaty aerobic exercise per week.  4)Reduce stress - consider counseling, meditation and relaxation to balance other aspects of your life.          Colin Benton R., DO

## 2016-05-26 ENCOUNTER — Ambulatory Visit (INDEPENDENT_AMBULATORY_CARE_PROVIDER_SITE_OTHER): Payer: 59 | Admitting: Family Medicine

## 2016-05-26 ENCOUNTER — Telehealth: Payer: Self-pay

## 2016-05-26 ENCOUNTER — Encounter: Payer: Self-pay | Admitting: Family Medicine

## 2016-05-26 VITALS — BP 118/50 | HR 76 | Temp 97.8°F | Ht 63.25 in | Wt 156.7 lb

## 2016-05-26 DIAGNOSIS — E785 Hyperlipidemia, unspecified: Secondary | ICD-10-CM | POA: Diagnosis not present

## 2016-05-26 DIAGNOSIS — I2510 Atherosclerotic heart disease of native coronary artery without angina pectoris: Secondary | ICD-10-CM

## 2016-05-26 DIAGNOSIS — E1140 Type 2 diabetes mellitus with diabetic neuropathy, unspecified: Secondary | ICD-10-CM | POA: Diagnosis not present

## 2016-05-26 DIAGNOSIS — I10 Essential (primary) hypertension: Secondary | ICD-10-CM | POA: Diagnosis not present

## 2016-05-26 LAB — BASIC METABOLIC PANEL
BUN: 17 mg/dL (ref 6–23)
CALCIUM: 9.7 mg/dL (ref 8.4–10.5)
CO2: 27 mEq/L (ref 19–32)
CREATININE: 1.08 mg/dL (ref 0.40–1.20)
Chloride: 101 mEq/L (ref 96–112)
GFR: 54.48 mL/min — AB (ref >60.00)
Glucose, Bld: 158 mg/dL — ABNORMAL HIGH (ref 70–99)
Potassium: 3.5 mEq/L (ref 3.5–5.1)
Sodium: 138 mEq/L (ref 135–145)

## 2016-05-26 LAB — HEMOGLOBIN A1C: HEMOGLOBIN A1C: 7.2 % — AB (ref 4.6–6.5)

## 2016-05-26 LAB — CBC
HCT: 37.3 % (ref 36.0–46.0)
Hemoglobin: 12.7 g/dL (ref 12.0–15.0)
MCHC: 34.1 g/dL (ref 30.0–36.0)
MCV: 94.4 fl (ref 78.0–100.0)
Platelets: 267 10*3/uL (ref 150.0–400.0)
RBC: 3.95 Mil/uL (ref 3.87–5.11)
RDW: 14.2 % (ref 11.5–15.5)
WBC: 7.3 10*3/uL (ref 4.0–10.5)

## 2016-05-26 NOTE — Telephone Encounter (Deleted)
Received request for brand name Benicar. PA submitted & is pending. Key: Carolyn Stare

## 2016-05-26 NOTE — Progress Notes (Signed)
Pre visit review using our clinic review tool, if applicable. No additional management support is needed unless otherwise documented below in the visit note. 

## 2016-05-26 NOTE — Telephone Encounter (Signed)
error 

## 2016-05-26 NOTE — Patient Instructions (Addendum)
BEFORE YOU LEAVE: -labs -follow up: 3 months  MAKE sure you are taking the losartan-hctz (hyzaar). Make sure you STOPPED the [facility].  We have ordered labs or studies at this visit. It can take up to 1-2 weeks for results and processing. IF results require follow up or explanation, we will call you with instructions. Clinically stable results will be released to your [facility]. If you have not heard from [place] or cannot find your results in [facility] in 2 weeks please contact our office at [phone].  If you are not yet signed up for [facility], please consider signing up.   We recommend the following healthy lifestyle for LIFE: 1) Small portions.   Tip: eat off of a salad plate instead of a dinner plate.  Tip: if you need more or a snack choose fruits, veggies and/or a handful of nuts or seeds.  2) Eat a healthy clean diet.  * Tip: Avoid (less then 1 serving per week): processed foods, sweets, sweetened drinks, white starches (rice, flour, bread, potatoes, pasta, etc), red meat, fast foods, butter  *Tip: CHOOSE instead   * 5-9 servings per day of fresh or frozen fruits and vegetables (but not corn, potatoes, bananas, canned or dried fruit)   *nuts and seeds, beans   *olives and olive oil   *small portions of lean meats such as fish and white chicken    *small portions of whole grains  3)Get at least 150 minutes of sweaty aerobic exercise per week.  4)Reduce stress - consider counseling, meditation and relaxation to balance other aspects of your life.

## 2016-05-29 ENCOUNTER — Other Ambulatory Visit: Payer: Self-pay | Admitting: *Deleted

## 2016-05-29 ENCOUNTER — Telehealth: Payer: Self-pay | Admitting: Family Medicine

## 2016-05-29 MED ORDER — GLUCOSE BLOOD VI STRP: ORAL_STRIP | 11 refills | Status: DC

## 2016-05-29 MED ORDER — GLIMEPIRIDE 4 MG PO TABS: 4.0000 mg | ORAL_TABLET | Freq: Every day | ORAL | 3 refills | Status: DC

## 2016-05-29 NOTE — Telephone Encounter (Signed)
Rx done. 

## 2016-05-29 NOTE — Telephone Encounter (Signed)
See results note. 

## 2016-05-29 NOTE — Addendum Note (Signed)
Addended by: Agnes Lawrence on: 05/29/2016 05:25 PM   Modules accepted: Orders

## 2016-05-29 NOTE — Telephone Encounter (Signed)
Pt returning your call

## 2016-05-29 NOTE — Telephone Encounter (Signed)
Andrea [name] pt returning your call

## 2016-06-05 ENCOUNTER — Other Ambulatory Visit: Payer: Self-pay | Admitting: Family Medicine

## 2016-08-19 ENCOUNTER — Other Ambulatory Visit: Payer: Self-pay | Admitting: Family Medicine

## 2016-08-21 NOTE — Progress Notes (Signed)
HPI:  Andrea [name] is a pleasant 61 y.o. here for follow up. Chronic medical problems summarized below were reviewed for changes and stability and were updated as needed below. These issues and their treatment remain stable for the most part. Doing well other then occ foot pain, burning in toes R>L. Reports diet is healthy and exercising. No smoking or alcohol. Denies hypoglycemia, CP, SOB, DOE, treatment intolerance or new symptoms. Due for repeat hgba1c  HTN: -meds: losartan - hctz, metoprolol  HLD/CAD: -meds: crestor, asa, BB, Arb  DM: -meds: asa, amaryl, januvia, metformin -sees Dr. Corrin Parker for eye care  ROS: See pertinent positives and negatives per HPI.  Past Medical History:  Diagnosis Date  . Allergy   . Anemia   . Arthritis   . CAD (coronary artery disease)    has 3 stents  . Cataract   . Diabetes mellitus   . Hx of adenomatous colonic polyps 08/09/2014  . Hyperlipidemia   . Hypertension   . Low back pain     Past Surgical History:  Procedure Laterality Date  . BREAST LUMPECTOMY    . carpal tunnel release both hands     . CESAREAN SECTION     2 times  . COLONOSCOPY    . EYE SURGERY     lens replaced/02/06/15 and 03/09/15  . knee surgery for torn cartillige     right knee  . LASIK  01/12/2014, 03/14/2014  . rotator cuff surgery     right side  . stents  2000  . stents 2001  2001   3 stents  . triger finger repair     both hands, right hand done twice    Family History  Problem Relation Age of Onset  . Heart disease Mother   . Hypertension Mother   . Heart disease Father   . Heart disease Sister   . Pancreatic cancer Paternal Uncle     Social History   Social History  . Marital status: Divorced    Spouse name: N/A  . Number of children: N/A  . Years of education: N/A   Social History Main Topics  . Smoking status: Former Smoker    Types: E-cigarettes    Quit date: 06/20/2010  . Smokeless tobacco: Never Used     Comment: smoked 1ppd for>  30 years/smokes occasionally,some vapor cigarettes  . Alcohol use No  . Drug use: No  . Sexual activity: Yes   Other Topics Concern  . 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  aspirin 81 MG tablet, Take 81 mg by mouth daily.  , Disp: , Rfl:  .  glimepiride (AMARYL) 4 MG tablet, Take 1 tablet (4 mg total) by mouth daily before breakfast., Disp: 30 tablet, Rfl: 3 .  glucose blood (ONE TOUCH ULTRA TEST) test strip, USE TO CHECK BLOOD SUGAR DAILY AND PRN, Disp: 100 each, Rfl: 11 .  JANUVIA 100 MG tablet, TAKE 1 TABLET(100 MG) BY MOUTH DAILY, Disp: 90 tablet, Rfl: 2 .  losartan-hydrochlorothiazide (HYZAAR) 50-12.5 MG tablet, Take 1 tablet by mouth daily., Disp: 90 tablet, Rfl: 1 .  metFORMIN (GLUCOPHAGE) 1000 MG tablet, TAKE 1 TABLET BY MOUTH TWICE  DAILY AT 4AM AND 6PM ON WORK DAYS AND AT 7 TO 8 AM AND 6PM ON NON-WORK DAYS, Disp: 60 tablet, Rfl: 5 .  metoprolol (LOPRESSOR) 50 MG tablet, TAKE 1/2 TABLET(25 MG) BY MOUTH TWICE DAILY, Disp: 30 tablet, Rfl: 11 .  Multiple Vitamins-Minerals (OSTEO COMPLEX) CAPS, Take by mouth daily.  , Disp: , Rfl:  .  ONETOUCH DELICA LANCETS FINE MISC, 1 Stick by Does not apply route daily., Disp: 100 each, Rfl: 3 .  rosuvastatin (CRESTOR) 10 MG tablet, TAKE 1 TABLET(10 MG) BY MOUTH DAILY, Disp: 90 tablet, Rfl: 3  EXAM:  Vitals:   08/22/16 0936  BP: 120/60  Pulse: 70  Temp: 98 F (36.7 C)    Body mass index is 27.47 kg/m.  GENERAL: vitals reviewed and listed above, alert, oriented, appears well hydrated and in no acute distress  HEENT: atraumatic, conjunttiva clear, no obvious abnormalities on inspection of external nose and ears  NECK: no obvious masses on inspection  LUNGS: clear to auscultation bilaterally, no wheezes, rales or  rhonchi, good air movement  CV: HRRR, no peripheral edema  FEET/SKIN: ingrown thickened toenails multiple toes, mild hammer toe, no wounds, normal sensitivity to light touch, normal pedal pulses  MS: moves all extremities without noticeable abnormality  PSYCH: pleasant and cooperative, no obvious depression or anxiety  ASSESSMENT AND PLAN:  Discussed the following assessment and plan:  Type 2 diabetes mellitus with diabetic neuropathy, without long-term current use of insulin (HCC) - Plan: Hemoglobin A1c, Ambulatory referral to Podiatry  Atherosclerosis of native coronary artery of native heart without angina pectoris  Hypertension associated with diabetes ([place])  Hyperlipidemia associated with type 2 diabetes mellitus ([place])  Toenail deformity - Plan: Ambulatory referral to Podiatry  Neuropathy of right foot - Plan: Vitamin B12, TSH, Ambulatory referral to Podiatry  -labs and will add b12, tsh, though foot paresthesias likely her diabetic neuropathy -referral to podiatry for symptoms, diabetes and ingrown thickened toenails -lifestyle recs, congratulated on changes -CPE in August -Patient advised to return or notify a doctor immediately if symptoms worsen or persist or new concerns arise.  Patient Instructions  BEFORE YOU LEAVE: -follow up: CPE in August -labs  -We placed a referral for you as discussed to the podiatrist. It usually takes about 1-2 weeks to process and schedule this referral. If you have not heard from [place] regarding this appointment in 2 weeks please contact our office.  Can try topical capsaicin for the foot issues. Checking some labs regarding this and please see the foot specialist (podiatrist) per referral.  Advise regular aerobic exercise (at least 150 minutes per week of sweaty exercise) and a healthy diet. Try to eat at least 5-9 servings of vegetables and fruits per day (not corn, potatoes or bananas.) Avoid sweets, red meat, pork, butter, fried foods,  fast food, processed food, excessive dairy, eggs and coconut. Replace bad fats with good fats - fish, nuts and seeds, canola oil, olive oil.   We have ordered labs or studies at this visit. It can take up to 1-2 weeks for results and processing. IF results require follow up or explanation, we will call you with instructions. Clinically stable results will be released to your [facility]. If you have not heard from [place] or cannot find your results in [facility] in 2 weeks please contact our office at [phone].  If you are not yet signed up for [facility], please consider signing up.            Dominga Mcduffie,  Trystian Crisanto R., DO

## 2016-08-22 ENCOUNTER — Ambulatory Visit (INDEPENDENT_AMBULATORY_CARE_PROVIDER_SITE_OTHER): Payer: 59 | Admitting: Family Medicine

## 2016-08-22 ENCOUNTER — Encounter: Payer: Self-pay | Admitting: Family Medicine

## 2016-08-22 VITALS — BP 120/60 | HR 70 | Temp 98.0°F | Ht 63.25 in | Wt 156.3 lb

## 2016-08-22 DIAGNOSIS — E1140 Type 2 diabetes mellitus with diabetic neuropathy, unspecified: Secondary | ICD-10-CM

## 2016-08-22 DIAGNOSIS — I2510 Atherosclerotic heart disease of native coronary artery without angina pectoris: Secondary | ICD-10-CM

## 2016-08-22 DIAGNOSIS — E1159 Type 2 diabetes mellitus with other circulatory complications: Secondary | ICD-10-CM

## 2016-08-22 DIAGNOSIS — E1169 Type 2 diabetes mellitus with other specified complication: Secondary | ICD-10-CM

## 2016-08-22 DIAGNOSIS — L608 Other nail disorders: Secondary | ICD-10-CM | POA: Diagnosis not present

## 2016-08-22 DIAGNOSIS — E785 Hyperlipidemia, unspecified: Secondary | ICD-10-CM

## 2016-08-22 DIAGNOSIS — I10 Essential (primary) hypertension: Secondary | ICD-10-CM | POA: Diagnosis not present

## 2016-08-22 DIAGNOSIS — G5791 Unspecified mononeuropathy of right lower limb: Secondary | ICD-10-CM

## 2016-08-22 DIAGNOSIS — I152 Hypertension secondary to endocrine disorders: Secondary | ICD-10-CM

## 2016-08-22 LAB — HEMOGLOBIN A1C: Hgb A1c MFr Bld: 7.7 % — ABNORMAL HIGH (ref 4.6–6.5)

## 2016-08-22 LAB — TSH: TSH: 3.07 u[IU]/mL (ref 0.35–4.50)

## 2016-08-22 LAB — VITAMIN B12: VITAMIN B 12: 159 pg/mL — AB (ref 211–911)

## 2016-08-22 NOTE — Patient Instructions (Signed)
BEFORE YOU LEAVE: -follow up: CPE in August -labs  -We placed a referral for you as discussed to the podiatrist. It usually takes about 1-2 weeks to process and schedule this referral. If you have not heard from [place] regarding this appointment in 2 weeks please contact our office.  Can try topical capsaicin for the foot issues. Checking some labs regarding this and please see the foot specialist (podiatrist) per referral.  Advise regular aerobic exercise (at least 150 minutes per week of sweaty exercise) and a healthy diet. Try to eat at least 5-9 servings of vegetables and fruits per day (not corn, potatoes or bananas.) Avoid sweets, red meat, pork, butter, fried foods, fast food, processed food, excessive dairy, eggs and coconut. Replace bad fats with good fats - fish, nuts and seeds, canola oil, olive oil.   We have ordered labs or studies at this visit. It can take up to 1-2 weeks for results and processing. IF results require follow up or explanation, we will call you with instructions. Clinically stable results will be released to your [facility]. If you have not heard from [place] or cannot find your results in [facility] in 2 weeks please contact our office at [phone].  If you are not yet signed up for [facility], please consider signing up.

## 2016-08-27 ENCOUNTER — Other Ambulatory Visit: Payer: Self-pay | Admitting: Family Medicine

## 2016-09-11 ENCOUNTER — Telehealth: Payer: Self-pay | Admitting: Family Medicine

## 2016-09-11 NOTE — Telephone Encounter (Signed)
See results note. 

## 2016-09-11 NOTE — Telephone Encounter (Signed)
Pt returning your call.  Pt works 3rd shift and will not be available after 10 am, if you can call before that. Thanks!

## 2016-09-27 ENCOUNTER — Other Ambulatory Visit: Payer: Self-pay | Admitting: Family Medicine

## 2016-09-29 NOTE — Telephone Encounter (Signed)
Filled for 3 months.  Pt has upcoming appt 12/30/16.

## 2016-10-03 ENCOUNTER — Other Ambulatory Visit: Payer: Self-pay | Admitting: Family Medicine

## 2016-10-30 ENCOUNTER — Other Ambulatory Visit: Payer: Self-pay | Admitting: Family Medicine

## 2016-11-21 ENCOUNTER — Other Ambulatory Visit: Payer: Self-pay | Admitting: Family Medicine

## 2016-12-29 ENCOUNTER — Other Ambulatory Visit: Payer: Self-pay | Admitting: Family Medicine

## 2016-12-29 NOTE — Progress Notes (Signed)
HPI:  Andrea [name] is a pleasant 61 year old, originally scheduled for a physical, but she wishes to change this to a sick visit as she is not feeling well. -Symptoms started about 5 days ago -Symptoms include a bad cough, wheezing, thick mucus at times, occasional body aches and nasal congestion -She denies fevers, nausea, vomiting, diarrhea, shortness of breath, hemoptysis, rash, flu or tick exposure -She has a remote history of smoking -She denies a history of lung disease or asthma  She has removed her shoes for diabetic foot exam today.  ROS: See pertinent positives and negatives per HPI.  Past Medical History:  Diagnosis Date  . Allergy   . Anemia   . Arthritis   . CAD (coronary artery disease)    has 3 stents  . Cataract   . Diabetes mellitus   . Hx of adenomatous colonic polyps 08/09/2014  . Hyperlipidemia   . Hypertension   . Low back pain     Past Surgical History:  Procedure Laterality Date  . BREAST LUMPECTOMY    . carpal tunnel release both hands     . CESAREAN SECTION     2 times  . COLONOSCOPY    . EYE SURGERY     lens replaced/02/06/15 and 03/09/15  . knee surgery for torn cartillige     right knee  . LASIK  01/12/2014, 03/14/2014  . rotator cuff surgery     right side  . stents  2000  . stents 2001  2001   3 stents  . triger finger repair     both hands, right hand done twice    Family History  Problem Relation Age of Onset  . Heart disease Mother   . Hypertension Mother   . Heart disease Father   . Heart disease Sister   . Pancreatic cancer Paternal Uncle     Social History   Social History  . Marital status: Divorced    Spouse name: N/A  . Number of children: N/A  . Years of education: N/A   Social History Main Topics  . Smoking status: Former Smoker    Types: E-cigarettes    Quit date: 06/20/2010  . Smokeless tobacco: Never Used     Comment: smoked 1ppd for> 30 years/smokes occasionally,some vapor cigarettes  . Alcohol use No   . Drug use: No  . Sexual activity: Yes   Other Topics Concern  . 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  aspirin 81 MG tablet, Take 81 mg by mouth daily.  , Disp: , Rfl:  .  glimepiride (AMARYL) 4 MG tablet, TAKE 1 TABLET(4 MG) BY MOUTH DAILY BEFORE BREAKFAST, Disp: 30 tablet, Rfl: 5 .  glucose blood (ONE TOUCH ULTRA TEST) test strip, USE TO CHECK BLOOD SUGAR DAILY AND PRN, Disp: 100 each, Rfl: 11 .  JANUVIA 100 MG tablet, TAKE 1 TABLET(100 MG) BY MOUTH DAILY, Disp: 90 tablet, Rfl: 2 .  losartan-hydrochlorothiazide (HYZAAR) 50-12.5 MG tablet, TAKE 1 TABLET BY MOUTH DAILY, Disp: 90 tablet, Rfl: 1 .  metFORMIN (GLUCOPHAGE) 1000 MG tablet, TAKE 1 TABLET BY MOUTH TWICE DAILY AT 4AM AND 6PM ON WORK DAYS AND AT 7 TO  8 AM AND 6PM ON NON-WORK DAYS, Disp: 60 tablet, Rfl: 5 .  metoprolol tartrate (LOPRESSOR) 50 MG tablet, TAKE 1/2 TABLET BY MOUTH TWICE DAILY, Disp: 90 tablet, Rfl: 1 .  Multiple Vitamins-Minerals (OSTEO COMPLEX) CAPS, Take by mouth daily.  , Disp: , Rfl:  .  ONETOUCH DELICA LANCETS FINE MISC, 1 Stick by Does not apply route daily., Disp: 100 each, Rfl: 3 .  rosuvastatin (CRESTOR) 10 MG tablet, TAKE 1 TABLET(10 MG) BY MOUTH DAILY, Disp: 90 tablet, Rfl: 1 .  albuterol (PROVENTIL HFA;VENTOLIN HFA) 108 (90 Base) MCG/ACT inhaler, Inhale 2 puffs into the lungs every 6 (six) hours as needed., Disp: 1 Inhaler, Rfl: 0 .  predniSONE (DELTASONE) 20 MG tablet, Take 2 tablets (40 mg total) by mouth daily with breakfast., Disp: 8 tablet, Rfl: 0  EXAM:  Vitals:   12/30/16 0740  BP: (!) 100/58  Pulse: 66  Temp: 98.1 F (36.7 C)  SpO2: 95%    Body mass index is 26.89 kg/m.  GENERAL: vitals reviewed and listed above, alert, oriented, appears well hydrated and in no acute  distress  HEENT: atraumatic, conjunttiva clear, no obvious abnormalities on inspection of external nose and ears, normal appearance of ear canals and TMs, clear nasal congestion, mild post oropharyngeal erythema with PND, no tonsillar edema or exudate, no sinus TTP  NECK: no obvious masses on inspection  LUNGS: diffuse inspiratory and expiratory wheezing  CV: HRRR, no peripheral edema  MS: moves all extremities without noticeable abnormality  PSYCH: pleasant and cooperative, no obvious depression or anxiety  ASSESSMENT AND PLAN:  Discussed the following assessment and plan:  Cough - Plan: DG Chest 2 View  Screening for depression  Respiratory infection  Bronchitis  -she is likely dealing with a viral bronchitis, we opted to treat with prednisone and albuterol given the degree of wheezing and cough, we also sent some Tessalon for the cough -we also will get a chest x-ray to exclude pneumonia given she has seen some thick mucus and will treat with antibiotic if needed -She is instructed to follow up promptly if she is worsening or if her symptoms are not improving with treatment -We did her diabetic foot exam, she has been referred to podiatry in the past for her toenail, she agrees to schedule -She was specifically do a physical today, however she did not fast for labs and is not feeling well, we opted to changes to a sick visit and schedule her physical for next month when she is feeling better -Patient advised to return or notify a doctor immediately if symptoms worsen or persist or new concerns arise.  Patient Instructions  BEFORE YOU LEAVE: -xray sheet -follow up: 1 month for CPE (physical was changed to sick visit today) fasting if possible  Go get xray today.  Start the prednisone.  Use the albuterol for wheezing, tightness, severe cough as needed.  Use the tessalon for cough as needed.  I hope you are feeling better soon! Seek care immediately if worsening, new  concerns or you are not improving with treatment.     Colin Benton R., DO

## 2016-12-30 ENCOUNTER — Encounter: Payer: Self-pay | Admitting: Family Medicine

## 2016-12-30 ENCOUNTER — Other Ambulatory Visit: Payer: Self-pay | Admitting: *Deleted

## 2016-12-30 ENCOUNTER — Ambulatory Visit (INDEPENDENT_AMBULATORY_CARE_PROVIDER_SITE_OTHER)
Admission: RE | Admit: 2016-12-30 | Discharge: 2016-12-30 | Disposition: A | Discharge status: Home / Self Care | Payer: 59 | Source: Ambulatory Visit | Attending: Family Medicine | Admitting: Family Medicine

## 2016-12-30 ENCOUNTER — Ambulatory Visit (INDEPENDENT_AMBULATORY_CARE_PROVIDER_SITE_OTHER): Payer: 59 | Admitting: Family Medicine

## 2016-12-30 VITALS — BP 100/58 | HR 66 | Temp 98.1°F | Ht 63.5 in | Wt 154.2 lb

## 2016-12-30 DIAGNOSIS — R059 Cough, unspecified: Secondary | ICD-10-CM

## 2016-12-30 DIAGNOSIS — J40 Bronchitis, not specified as acute or chronic: Secondary | ICD-10-CM

## 2016-12-30 DIAGNOSIS — Z1331 Encounter for screening for depression: Secondary | ICD-10-CM

## 2016-12-30 DIAGNOSIS — Z1389 Encounter for screening for other disorder: Secondary | ICD-10-CM | POA: Diagnosis not present

## 2016-12-30 DIAGNOSIS — J988 Other specified respiratory disorders: Secondary | ICD-10-CM | POA: Diagnosis not present

## 2016-12-30 DIAGNOSIS — R05 Cough: Secondary | ICD-10-CM | POA: Diagnosis not present

## 2016-12-30 DIAGNOSIS — J189 Pneumonia, unspecified organism: Secondary | ICD-10-CM

## 2016-12-30 MED ORDER — BENZONATATE 100 MG PO CAPS: 100.0000 mg | ORAL_CAPSULE | Freq: Three times a day (TID) | ORAL | 0 refills | Status: DC | PRN

## 2016-12-30 MED ORDER — PREDNISONE 20 MG PO TABS: 40.0000 mg | ORAL_TABLET | Freq: Every day | ORAL | 0 refills | Status: DC

## 2016-12-30 MED ORDER — ALBUTEROL SULFATE HFA 108 (90 BASE) MCG/ACT IN AERS: 2.0000 | INHALATION_SPRAY | Freq: Four times a day (QID) | RESPIRATORY_TRACT | 0 refills | Status: DC | PRN

## 2016-12-30 MED ORDER — DOXYCYCLINE HYCLATE 100 MG PO TABS: 100.0000 mg | ORAL_TABLET | Freq: Two times a day (BID) | ORAL | 0 refills | Status: DC

## 2016-12-30 NOTE — Patient Instructions (Signed)
BEFORE YOU LEAVE: -xray sheet -follow up: 1 month for CPE (physical was changed to sick visit today) fasting if possible  Go get xray today.  Start the prednisone.  Use the albuterol for wheezing, tightness, severe cough as needed.  Use the tessalon for cough as needed.  I hope you are feeling better soon! Seek care immediately if worsening, new concerns or you are not improving with treatment.

## 2016-12-31 ENCOUNTER — Telehealth: Payer: Self-pay | Admitting: Family Medicine

## 2016-12-31 NOTE — Telephone Encounter (Signed)
Pt is calling stating that she received albuterol and it has sulfate in it and she is allergic to sulfur and has a real bad headache.  Pt would like to have a call back.

## 2017-01-01 NOTE — Telephone Encounter (Signed)
I spoke to patient. She states albuterol inhaler is giving her a severe headache after use.  She states she has only used it twice and has gotten severe headache after each use.  Pt requesting we change the medication. Please advise

## 2017-01-01 NOTE — Telephone Encounter (Signed)
Would stop and use the anitbiotic and prednisone. If struggling to breath or worsening despite these medications would advise ER.

## 2017-01-01 NOTE — Telephone Encounter (Signed)
I spoke to patient and advised recommendations.  Patient voiced understanding and agreed with plan

## 2017-01-21 ENCOUNTER — Other Ambulatory Visit: Payer: Self-pay | Admitting: Family Medicine

## 2017-02-02 ENCOUNTER — Encounter: Payer: 59 | Admitting: Family Medicine

## 2017-02-06 ENCOUNTER — Other Ambulatory Visit: Payer: Self-pay | Admitting: Family Medicine

## 2017-03-06 NOTE — Progress Notes (Signed)
HPI:  Here for CPE: Due for labs: b12, cbc, bmp, hgba1c, lipids, flu vaccine  Follow up today:  HTN: -meds: losartan - hctz, metoprolol  B12 def: -on oral supplementation  HLD/CAD: -meds: crestor, asa, BB, Arb -hx PCI, 3 stents remotely  DM: -meds: asa, amaryl, januvia, metformin -sees Dr. Corrin Parker for eye care   -Diet: variety of foods, balance and well rounded, larger portion sizes -Exercise: no regular exercise -Taking folic acid, vitamin D or calcium: no -Diabetes and Dyslipidemia Screening: due for labs -Vaccines: see vaccine section EPIC -pap history: 11/2014 pap normal -FDLMP: see nursing notes -sexual activity: yes, female partner, no new partners -wants STI testing (Hep C if born [birth year]): no -FH breast, colon or ovarian ca: see FH Last mammogram: [facility] 1 04/2016, [facility] Last colon cancer screening: colonosocpy 07/2014, polyp --> 3 year repeat advised, Dr. Carlean Purl Breast Ca Risk Assessment: see family history and pt history DEXA (>/= 44): n/a  -Alcohol, Tobacco, drug use: see social history  Review of Systems - no fevers, unintentional weight loss, vision loss, hearing loss, chest pain, sob, hemoptysis, melena, hematochezia, hematuria, genital discharge, changing or concerning skin lesions, bleeding, bruising, loc, thoughts of self harm or SI  Past Medical History:  Diagnosis Date  . Allergy   . Anemia   . Arthritis   . CAD (coronary artery disease)    has 3 stents  . Cataract   . Diabetes mellitus   . Hx of adenomatous colonic polyps 08/09/2014  . Hyperlipidemia   . Hypertension   . Low back pain     Past Surgical History:  Procedure Laterality Date  . BREAST LUMPECTOMY    . carpal tunnel release both hands     . CESAREAN SECTION     2 times  . COLONOSCOPY    . EYE SURGERY     lens replaced/02/06/15 and 03/09/15  . knee surgery for torn cartillige     right knee  . LASIK  01/12/2014, 03/14/2014  . rotator cuff surgery     right side  .  stents  2000  . stents 2001  2001   3 stents  . triger finger repair     both hands, right hand done twice    Family History  Problem Relation Age of Onset  . Heart disease Mother   . Hypertension Mother   . Heart disease Father   . Heart disease Sister   . Pancreatic cancer Paternal Uncle     Social History   Socioeconomic History  . Marital status: Divorced    Spouse name: None  . Number of children: None  . Years of education: None  . Highest education level: None  Social Needs  . Financial resource strain: None  . Food insecurity - worry: None  . Food insecurity - inability: None  . Transportation needs - medical: None  . Transportation needs - non-medical: None  Occupational History  . None  Tobacco Use  . Smoking status: Former Smoker    Types: E-cigarettes    Last attempt to quit: 06/20/2010    Years since quitting: 6.7  . Smokeless tobacco: Never Used  . Tobacco comment: smoked 1ppd for> 30 years/smokes occasionally,some vapor cigarettes  Substance and Sexual Activity  . Alcohol use: No    Alcohol/week: 0.0 oz  . Drug use: No  . Sexual activity: Yes  Other Topics Concern  . 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  aspirin 81 MG tablet, Take 81 mg by mouth daily.  , Disp: , Rfl:  .  glimepiride (AMARYL) 4 MG tablet, TAKE 1 TABLET(4 MG) BY MOUTH DAILY BEFORE BREAKFAST, Disp: 30 tablet, Rfl: 5 .  glucose blood (ONE TOUCH ULTRA TEST) test strip, USE TO CHECK BLOOD SUGAR DAILY AND PRN, Disp: 100 each, Rfl: 11 .  JANUVIA 100 MG tablet, TAKE 1 TABLET(100 MG) BY MOUTH DAILY, Disp: 90 tablet, Rfl: 2 .  losartan-hydrochlorothiazide (HYZAAR) 50-12.5 MG tablet, TAKE 1 TABLET BY MOUTH DAILY, Disp: 90 tablet, Rfl: 1 .  metFORMIN (GLUCOPHAGE) 1000 MG tablet, TAKE  1 TABLET BY MOUTH TWICE DAILY AT 4AM AND 6PM ON WORK DAYS AND AT 7 TO 8 AM AND 6PM ON NON-WORK DAYS, Disp: 60 tablet, Rfl: 5 .  metoprolol tartrate (LOPRESSOR) 50 MG tablet, TAKE 1/2 TABLET BY MOUTH TWICE DAILY, Disp: 90 tablet, Rfl: 1 .  Multiple Vitamins-Minerals (OSTEO COMPLEX) CAPS, Take by mouth daily.  , Disp: , Rfl:  .  ONETOUCH DELICA LANCETS FINE MISC, 1 Stick by Does not apply route daily., Disp: 100 each, Rfl: 3 .  rosuvastatin (CRESTOR) 10 MG tablet, TAKE 1 TABLET(10 MG) BY MOUTH DAILY, Disp: 90 tablet, Rfl: 1  EXAM:  Vitals:   03/09/17 0733  BP: (!) 122/50  Pulse: (!) 57  Temp: 98.5 F (36.9 C)    GENERAL: vitals reviewed and listed below, alert, oriented, appears well hydrated and in no acute distress  HEENT: head atraumatic, PERRLA, normal appearance of eyes, ears, nose and mouth. moist mucus membranes.  NECK: supple, no masses or lymphadenopathy  LUNGS: clear to auscultation bilaterally, no rales, rhonchi or wheeze  CV: HRRR, no peripheral edema or cyanosis, normal pedal pulses  ABDOMEN: bowel sounds normal, soft, non tender to palpation, no masses, no rebound or guarding  GU/BREAST: pt declined  SKIN: no rash or abnormal lesions  MS: normal gait, moves all extremities normally  NEURO: normal gait, speech and thought processing grossly intact, muscle tone grossly intact throughout  PSYCH: normal affect, pleasant and cooperative  ASSESSMENT AND PLAN:  Discussed the following assessment and plan:  1. PREVENTIVE EXAM: -Discussed and advised all [place] preventive services health task force level A and B recommendations for age, sex and risks. -Advised at least 150 minutes of exercise per week and a healthy diet with avoidance of (less then 1 serving per week) processed foods, white starches, red meat, fast foods and sweets and consisting of: * 5-9 servings of fresh fruits and vegetables (not corn or potatoes) *nuts and seeds, beans *olives and olive oil *lean  meats such as fish and white chicken  *whole grains -labs, studies and vaccines per orders this encounter  2. Hypertension associated with diabetes ([place]) - Basic metabolic panel - CBC  3. Type 2 diabetes mellitus with diabetic neuropathy, without long-term current use of insulin (HCC) -lifestyle recs -cont current tx - Hemoglobin A1c  4. Screening for depression -neg  5. Hyperlipidemia, unspecified hyperlipidemia type - Lipid panel  6. B12 deficiency - Vitamin B12  7. Coronary artery disease involving native heart without angina pectoris, unspecified vessel or lesion type -lifestyle recs -she does not use tobacco or alcohol -cont current tx   Patient advised to return to clinic immediately if  symptoms worsen or persist or new concerns.  Patient Instructions  BEFORE YOU LEAVE: -flu shot -labs -follow up: 3-4 months  Vit D3 [phone] IU daily  We have ordered labs or studies at this visit. It can take up to 1-2 weeks for results and processing. IF results require follow up or explanation, we will call you with instructions. Clinically stable results will be released to your [facility]. If you have not heard from [place] or cannot find your results in [facility] in 2 weeks please contact our office at [phone].  If you are not yet signed up for [facility], please consider signing up.   Health Maintenance for Postmenopausal Women Menopause is a normal process in which your reproductive ability comes to an end. This process happens gradually over a span of months to years, usually between the ages of 40 and 71. Menopause is complete when you have missed 12 consecutive menstrual periods. It is important to talk with your health care provider about some of the most common conditions that affect postmenopausal women, such as heart disease, cancer, and bone loss (osteoporosis). Adopting a healthy lifestyle and getting preventive care can help to promote your health and wellness. Those actions  can also lower your chances of developing some of these common conditions. What should I know about menopause? During menopause, you may experience a number of symptoms, such as:  Moderate-to-severe hot flashes.  Night sweats.  Decrease in sex drive.  Mood swings.  Headaches.  Tiredness.  Irritability.  Memory problems.  Insomnia.  Choosing to treat or not to treat menopausal changes is an individual decision that you make with your health care provider. What should I know about hormone replacement therapy and supplements? Hormone therapy products are effective for treating symptoms that are associated with menopause, such as hot flashes and night sweats. Hormone replacement carries certain risks, especially as you become older. If you are thinking about using estrogen or estrogen with progestin treatments, discuss the benefits and risks with your health care provider. What should I know about heart disease and stroke? Heart disease, heart attack, and stroke become more likely as you age. This may be due, in part, to the hormonal changes that your body experiences during menopause. These can affect how your body processes dietary fats, triglycerides, and cholesterol. Heart attack and stroke are both medical emergencies. There are many things that you can do to help prevent heart disease and stroke:  Have your blood pressure checked at least every 1-2 years. High blood pressure causes heart disease and increases the risk of stroke.  If you are 53-49 years old, ask your health care provider if you should take aspirin to prevent a heart attack or a stroke.  Do not use any tobacco products, including cigarettes, chewing tobacco, or electronic cigarettes. If you need help quitting, ask your health care provider.  It is important to eat a healthy diet and maintain a healthy weight. ? Be sure to include plenty of vegetables, fruits, low-fat dairy products, and lean protein. ? Avoid  eating foods that are high in solid fats, added sugars, or salt (sodium).  Get regular exercise. This is one of the most important things that you can do for your health. ? Try to exercise for at least 150 minutes each week. The type of exercise that you do should increase your heart rate and make you sweat. This is known as moderate-intensity exercise. ? Try to do strengthening exercises at least twice each week. Do these  in addition to the moderate-intensity exercise.  Know your numbers.Ask your health care provider to check your cholesterol and your blood glucose. Continue to have your blood tested as directed by your health care provider.  What should I know about cancer screening? There are several types of cancer. Take the following steps to reduce your risk and to catch any cancer development as early as possible. Breast Cancer  Practice breast self-awareness. ? This means understanding how your breasts normally appear and feel. ? It also means doing regular breast self-exams. Let your health care provider know about any changes, no matter how small.  If you are 22 or older, have a clinician do a breast exam (clinical breast exam or CBE) every year. Depending on your age, family history, and medical history, it may be recommended that you also have a yearly breast X-ray (mammogram).  If you have a family history of breast cancer, talk with your health care provider about genetic screening.  If you are at high risk for breast cancer, talk with your health care provider about having an MRI and a mammogram every year.  Breast cancer (BRCA) gene test is recommended for women who have family members with BRCA-related cancers. Results of the assessment will determine the need for genetic counseling and BRCA1 and for BRCA2 testing. BRCA-related cancers include these types: ? Breast. This occurs in males or females. ? Ovarian. ? Tubal. This may also be called fallopian tube cancer. ? Cancer  of the abdominal or pelvic lining (peritoneal cancer). ? Prostate. ? Pancreatic.  Cervical, Uterine, and Ovarian Cancer Your health care provider may recommend that you be screened regularly for cancer of the pelvic organs. These include your ovaries, uterus, and vagina. This screening involves a pelvic exam, which includes checking for microscopic changes to the surface of your cervix (Pap test).  For women ages 21-65, health care providers may recommend a pelvic exam and a Pap test every three years. For women ages 66-65, they may recommend the Pap test and pelvic exam, combined with testing for human papilloma virus (HPV), every five years. Some types of HPV increase your risk of cervical cancer. Testing for HPV may also be done on women of any age who have unclear Pap test results.  Other health care providers may not recommend any screening for nonpregnant women who are considered low risk for pelvic cancer and have no symptoms. Ask your health care provider if a screening pelvic exam is right for you.  If you have had past treatment for cervical cancer or a condition that could lead to cancer, you need Pap tests and screening for cancer for at least 20 years after your treatment. If Pap tests have been discontinued for you, your risk factors (such as having a new sexual partner) need to be reassessed to determine if you should start having screenings again. Some women have medical problems that increase the chance of getting cervical cancer. In these cases, your health care provider may recommend that you have screening and Pap tests more often.  If you have a family history of uterine cancer or ovarian cancer, talk with your health care provider about genetic screening.  If you have vaginal bleeding after reaching menopause, tell your health care provider.  There are currently no reliable tests available to screen for ovarian cancer.  Lung Cancer Lung cancer screening is recommended for  adults 87-34 years old who are at high risk for lung cancer because of a history of smoking.  A yearly low-dose CT scan of the lungs is recommended if you:  Currently smoke.  Have a history of at least 30 pack-years of smoking and you currently smoke or have quit within the past 15 years. A pack-year is smoking an average of one pack of cigarettes per day for one year.  Yearly screening should:  Continue until it has been 15 years since you quit.  Stop if you develop a health problem that would prevent you from having lung cancer treatment.  Colorectal Cancer  This type of cancer can be detected and can often be prevented.  Routine colorectal cancer screening usually begins at age 56 and continues through age 15.  If you have risk factors for colon cancer, your health care provider may recommend that you be screened at an earlier age.  If you have a family history of colorectal cancer, talk with your health care provider about genetic screening.  Your health care provider may also recommend using home test kits to check for hidden blood in your stool.  A small camera at the end of a tube can be used to examine your colon directly (sigmoidoscopy or colonoscopy). This is done to check for the earliest forms of colorectal cancer.  Direct examination of the colon should be repeated every 5-10 years until age 3. However, if early forms of precancerous polyps or small growths are found or if you have a family history or genetic risk for colorectal cancer, you may need to be screened more often.  Skin Cancer  Check your skin from head to toe regularly.  Monitor any moles. Be sure to tell your health care provider: ? About any new moles or changes in moles, especially if there is a change in a mole's shape or color. ? If you have a mole that is larger than the size of a pencil eraser.  If any of your family members has a history of skin cancer, especially at a young age, talk with your  health care provider about genetic screening.  Always use sunscreen. Apply sunscreen liberally and repeatedly throughout the day.  Whenever you are outside, protect yourself by wearing long sleeves, pants, a wide-brimmed hat, and sunglasses.  What should I know about osteoporosis? Osteoporosis is a condition in which bone destruction happens more quickly than new bone creation. After menopause, you may be at an increased risk for osteoporosis. To help prevent osteoporosis or the bone fractures that can happen because of osteoporosis, the following is recommended:  If you are 7-15 years old, get at least 1,000 mg of calcium and at least 600 mg of vitamin D per day.  If you are older than age 82 but younger than age 31, get at least 1,200 mg of calcium and at least 600 mg of vitamin D per day.  If you are older than age 59, get at least 1,200 mg of calcium and at least 800 mg of vitamin D per day.  Smoking and excessive alcohol intake increase the risk of osteoporosis. Eat foods that are rich in calcium and vitamin D, and do weight-bearing exercises several times each week as directed by your health care provider. What should I know about how menopause affects my mental health? Depression may occur at any age, but it is more common as you become older. Common symptoms of depression include:  Low or sad mood.  Changes in sleep patterns.  Changes in appetite or eating patterns.  Feeling an overall lack of motivation or  enjoyment of activities that you previously enjoyed.  Frequent crying spells.  Talk with your health care provider if you think that you are experiencing depression. What should I know about immunizations? It is important that you get and maintain your immunizations. These include:  Tetanus, diphtheria, and pertussis (Tdap) booster vaccine.  Influenza every year before the flu season begins.  Pneumonia vaccine.  Shingles vaccine.  Your health care provider may  also recommend other immunizations. This information is not intended to replace advice given to you by your health care provider. Make sure you discuss any questions you have with your health care provider. Document Released: 05/23/2005 Document Revised: 10/19/2015 Document Reviewed: 01/02/2015 Elsevier Interactive Patient Education  2018 [employer].          No Follow-up on file.  Colin Benton R., DO

## 2017-03-09 ENCOUNTER — Encounter: Payer: Self-pay | Admitting: Family Medicine

## 2017-03-09 ENCOUNTER — Ambulatory Visit (INDEPENDENT_AMBULATORY_CARE_PROVIDER_SITE_OTHER): Payer: 59 | Admitting: Family Medicine

## 2017-03-09 VITALS — BP 122/50 | HR 57 | Temp 98.5°F | Ht 63.25 in | Wt 153.9 lb

## 2017-03-09 DIAGNOSIS — Z0000 Encounter for general adult medical examination without abnormal findings: Secondary | ICD-10-CM

## 2017-03-09 DIAGNOSIS — I152 Hypertension secondary to endocrine disorders: Secondary | ICD-10-CM

## 2017-03-09 DIAGNOSIS — E1140 Type 2 diabetes mellitus with diabetic neuropathy, unspecified: Secondary | ICD-10-CM | POA: Diagnosis not present

## 2017-03-09 DIAGNOSIS — E785 Hyperlipidemia, unspecified: Secondary | ICD-10-CM | POA: Diagnosis not present

## 2017-03-09 DIAGNOSIS — Z1331 Encounter for screening for depression: Secondary | ICD-10-CM

## 2017-03-09 DIAGNOSIS — I10 Essential (primary) hypertension: Secondary | ICD-10-CM

## 2017-03-09 DIAGNOSIS — I2510 Atherosclerotic heart disease of native coronary artery without angina pectoris: Secondary | ICD-10-CM | POA: Diagnosis not present

## 2017-03-09 DIAGNOSIS — E1159 Type 2 diabetes mellitus with other circulatory complications: Secondary | ICD-10-CM | POA: Diagnosis not present

## 2017-03-09 DIAGNOSIS — Z23 Encounter for immunization: Secondary | ICD-10-CM

## 2017-03-09 DIAGNOSIS — E538 Deficiency of other specified B group vitamins: Secondary | ICD-10-CM

## 2017-03-09 LAB — BASIC METABOLIC PANEL
BUN: 19 mg/dL (ref 6–23)
CO2: 26 meq/L (ref 19–32)
Calcium: 9.8 mg/dL (ref 8.4–10.5)
Chloride: 102 mEq/L (ref 96–112)
Creatinine, Ser: 1.07 mg/dL (ref 0.40–1.20)
GFR: 54.93 mL/min — ABNORMAL LOW (ref >60.00)
GLUCOSE: 95 mg/dL (ref 70–99)
Potassium: 3.9 mEq/L (ref 3.5–5.1)
Sodium: 138 mEq/L (ref 135–145)

## 2017-03-09 LAB — CBC
HCT: 37.6 % (ref 36.0–46.0)
HEMOGLOBIN: 13 g/dL (ref 12.0–15.0)
MCHC: 34.5 g/dL (ref 30.0–36.0)
MCV: 95.7 fl (ref 78.0–100.0)
PLATELETS: 277 10*3/uL (ref 150.0–400.0)
RBC: 3.93 Mil/uL (ref 3.87–5.11)
RDW: 14.5 % (ref 11.5–15.5)
WBC: 7.7 10*3/uL (ref 4.0–10.5)

## 2017-03-09 LAB — LIPID PANEL
CHOL/HDL RATIO: 4
Cholesterol: 110 mg/dL (ref 0–200)
HDL: 28.7 mg/dL — ABNORMAL LOW (ref >39.00)
NonHDL: 81.14
Triglycerides: 276 mg/dL — ABNORMAL HIGH (ref 0.0–149.0)
VLDL: 55.2 mg/dL — ABNORMAL HIGH (ref 0.0–40.0)

## 2017-03-09 LAB — LDL CHOLESTEROL, DIRECT: LDL DIRECT: 50 mg/dL

## 2017-03-09 LAB — HEMOGLOBIN A1C: Hgb A1c MFr Bld: 7.2 % — ABNORMAL HIGH (ref 4.6–6.5)

## 2017-03-09 LAB — VITAMIN B12: Vitamin B-12: 471 pg/mL (ref 211–911)

## 2017-03-09 NOTE — Patient Instructions (Addendum)
BEFORE YOU LEAVE: -flu shot -labs -follow up: 3-4 months  Vit D3 [id number] IU daily  We have ordered labs or studies at this visit. It can take up to 1-2 weeks for results and processing. IF results require follow up or explanation, we will call you with instructions. Clinically stable results will be released to your [facility]. If you have not heard from [place] or cannot find your results in [facility] in 2 weeks please contact our office at [phone].  If you are not yet signed up for [facility], please consider signing up.   Health Maintenance for Postmenopausal Women Menopause is a normal process in which your reproductive ability comes to an end. This process happens gradually over a span of months to years, usually between the ages of 12 and 52. Menopause is complete when you have missed 12 consecutive menstrual periods. It is important to talk with your health care provider about some of the most common conditions that affect postmenopausal women, such as heart disease, cancer, and bone loss (osteoporosis). Adopting a healthy lifestyle and getting preventive care can help to promote your health and wellness. Those actions can also lower your chances of developing some of these common conditions. What should I know about menopause? During menopause, you may experience a number of symptoms, such as:  Moderate-to-severe hot flashes.  Night sweats.  Decrease in sex drive.  Mood swings.  Headaches.  Tiredness.  Irritability.  Memory problems.  Insomnia.  Choosing to treat or not to treat menopausal changes is an individual decision that you make with your health care provider. What should I know about hormone replacement therapy and supplements? Hormone therapy products are effective for treating symptoms that are associated with menopause, such as hot flashes and night sweats. Hormone replacement carries certain risks, especially as you become older. If you are thinking about using  estrogen or estrogen with progestin treatments, discuss the benefits and risks with your health care provider. What should I know about heart disease and stroke? Heart disease, heart attack, and stroke become more likely as you age. This may be due, in part, to the hormonal changes that your body experiences during menopause. These can affect how your body processes dietary fats, triglycerides, and cholesterol. Heart attack and stroke are both medical emergencies. There are many things that you can do to help prevent heart disease and stroke:  Have your blood pressure checked at least every 1-2 years. High blood pressure causes heart disease and increases the risk of stroke.  If you are 75-58 years old, ask your health care provider if you should take aspirin to prevent a heart attack or a stroke.  Do not use any tobacco products, including cigarettes, chewing tobacco, or electronic cigarettes. If you need help quitting, ask your health care provider.  It is important to eat a healthy diet and maintain a healthy weight. ? Be sure to include plenty of vegetables, fruits, low-fat dairy products, and lean protein. ? Avoid eating foods that are high in solid fats, added sugars, or salt (sodium).  Get regular exercise. This is one of the most important things that you can do for your health. ? Try to exercise for at least 150 minutes each week. The type of exercise that you do should increase your heart rate and make you sweat. This is known as moderate-intensity exercise. ? Try to do strengthening exercises at least twice each week. Do these in addition to the moderate-intensity exercise.  Know your numbers.Ask your health  care provider to check your cholesterol and your blood glucose. Continue to have your blood tested as directed by your health care provider.  What should I know about cancer screening? There are several types of cancer. Take the following steps to reduce your risk and to catch  any cancer development as early as possible. Breast Cancer  Practice breast self-awareness. ? This means understanding how your breasts normally appear and feel. ? It also means doing regular breast self-exams. Let your health care provider know about any changes, no matter how small.  If you are 29 or older, have a clinician do a breast exam (clinical breast exam or CBE) every year. Depending on your age, family history, and medical history, it may be recommended that you also have a yearly breast X-ray (mammogram).  If you have a family history of breast cancer, talk with your health care provider about genetic screening.  If you are at high risk for breast cancer, talk with your health care provider about having an MRI and a mammogram every year.  Breast cancer (BRCA) gene test is recommended for women who have family members with BRCA-related cancers. Results of the assessment will determine the need for genetic counseling and BRCA1 and for BRCA2 testing. BRCA-related cancers include these types: ? Breast. This occurs in males or females. ? Ovarian. ? Tubal. This may also be called fallopian tube cancer. ? Cancer of the abdominal or pelvic lining (peritoneal cancer). ? Prostate. ? Pancreatic.  Cervical, Uterine, and Ovarian Cancer Your health care provider may recommend that you be screened regularly for cancer of the pelvic organs. These include your ovaries, uterus, and vagina. This screening involves a pelvic exam, which includes checking for microscopic changes to the surface of your cervix (Pap test).  For women ages 21-65, health care providers may recommend a pelvic exam and a Pap test every three years. For women ages 2-65, they may recommend the Pap test and pelvic exam, combined with testing for human papilloma virus (HPV), every five years. Some types of HPV increase your risk of cervical cancer. Testing for HPV may also be done on women of any age who have unclear Pap test  results.  Other health care providers may not recommend any screening for nonpregnant women who are considered low risk for pelvic cancer and have no symptoms. Ask your health care provider if a screening pelvic exam is right for you.  If you have had past treatment for cervical cancer or a condition that could lead to cancer, you need Pap tests and screening for cancer for at least 20 years after your treatment. If Pap tests have been discontinued for you, your risk factors (such as having a new sexual partner) need to be reassessed to determine if you should start having screenings again. Some women have medical problems that increase the chance of getting cervical cancer. In these cases, your health care provider may recommend that you have screening and Pap tests more often.  If you have a family history of uterine cancer or ovarian cancer, talk with your health care provider about genetic screening.  If you have vaginal bleeding after reaching menopause, tell your health care provider.  There are currently no reliable tests available to screen for ovarian cancer.  Lung Cancer Lung cancer screening is recommended for adults 29-40 years old who are at high risk for lung cancer because of a history of smoking. A yearly low-dose CT scan of the lungs is recommended if you:  Currently smoke.  Have a history of at least 30 pack-years of smoking and you currently smoke or have quit within the past 15 years. A pack-year is smoking an average of one pack of cigarettes per day for one year.  Yearly screening should:  Continue until it has been 15 years since you quit.  Stop if you develop a health problem that would prevent you from having lung cancer treatment.  Colorectal Cancer  This type of cancer can be detected and can often be prevented.  Routine colorectal cancer screening usually begins at age 61 and continues through age 35.  If you have risk factors for colon cancer, your health  care provider may recommend that you be screened at an earlier age.  If you have a family history of colorectal cancer, talk with your health care provider about genetic screening.  Your health care provider may also recommend using home test kits to check for hidden blood in your stool.  A small camera at the end of a tube can be used to examine your colon directly (sigmoidoscopy or colonoscopy). This is done to check for the earliest forms of colorectal cancer.  Direct examination of the colon should be repeated every 5-10 years until age 69. However, if early forms of precancerous polyps or small growths are found or if you have a family history or genetic risk for colorectal cancer, you may need to be screened more often.  Skin Cancer  Check your skin from head to toe regularly.  Monitor any moles. Be sure to tell your health care provider: ? About any new moles or changes in moles, especially if there is a change in a mole's shape or color. ? If you have a mole that is larger than the size of a pencil eraser.  If any of your family members has a history of skin cancer, especially at a young age, talk with your health care provider about genetic screening.  Always use sunscreen. Apply sunscreen liberally and repeatedly throughout the day.  Whenever you are outside, protect yourself by wearing long sleeves, pants, a wide-brimmed hat, and sunglasses.  What should I know about osteoporosis? Osteoporosis is a condition in which bone destruction happens more quickly than new bone creation. After menopause, you may be at an increased risk for osteoporosis. To help prevent osteoporosis or the bone fractures that can happen because of osteoporosis, the following is recommended:  If you are 38-38 years old, get at least 1,000 mg of calcium and at least 600 mg of vitamin D per day.  If you are older than age 59 but younger than age 64, get at least 1,200 mg of calcium and at least 600 mg of  vitamin D per day.  If you are older than age 55, get at least 1,200 mg of calcium and at least 800 mg of vitamin D per day.  Smoking and excessive alcohol intake increase the risk of osteoporosis. Eat foods that are rich in calcium and vitamin D, and do weight-bearing exercises several times each week as directed by your health care provider. What should I know about how menopause affects my mental health? Depression may occur at any age, but it is more common as you become older. Common symptoms of depression include:  Low or sad mood.  Changes in sleep patterns.  Changes in appetite or eating patterns.  Feeling an overall lack of motivation or enjoyment of activities that you previously enjoyed.  Frequent crying spells.  Talk with your health care provider if you think that you are experiencing depression. What should I know about immunizations? It is important that you get and maintain your immunizations. These include:  Tetanus, diphtheria, and pertussis (Tdap) booster vaccine.  Influenza every year before the flu season begins.  Pneumonia vaccine.  Shingles vaccine.  Your health care provider may also recommend other immunizations. This information is not intended to replace advice given to you by your health care provider. Make sure you discuss any questions you have with your health care provider. Document Released: 05/23/2005 Document Revised: 10/19/2015 Document Reviewed: 01/02/2015 Elsevier Interactive Patient Education  2018 [place]

## 2017-03-09 NOTE — Addendum Note (Signed)
Addended by: Agnes Lawrence on: 03/09/2017 08:34 AM   Modules accepted: Orders

## 2017-03-13 ENCOUNTER — Telehealth: Payer: Self-pay | Admitting: Family Medicine

## 2017-03-13 NOTE — Telephone Encounter (Signed)
This encounter was created in error - please disregard.

## 2017-03-13 NOTE — Addendum Note (Signed)
Addended by: Corky Sox E on: 03/13/2017 12:12 PM   Modules accepted: Level of Service, SmartSet

## 2017-03-13 NOTE — Telephone Encounter (Signed)
Results given    Notes recorded by Lucretia Kern, DO on 03/11/2017 at 8:11 PM EST Labs show: -diabetes lab improved some but still a little above goal of under 7, prefer around 6.5 - advise decreasing sugar, sweets, fruit and simple starches in diet and increasing aerobic exercise, cont current meds -B12 improved - continue b12 supplementation -trigs a little higher - lifestyle recs per above for sugar -otherwise fairly stable

## 2017-03-19 ENCOUNTER — Other Ambulatory Visit: Payer: Self-pay | Admitting: Family Medicine

## 2017-05-12 ENCOUNTER — Other Ambulatory Visit: Payer: Self-pay | Admitting: Family Medicine

## 2017-05-12 ENCOUNTER — Other Ambulatory Visit: Payer: Self-pay | Admitting: *Deleted

## 2017-05-12 MED ORDER — GLIMEPIRIDE 4 MG PO TABS: ORAL_TABLET | ORAL | 5 refills | Status: DC

## 2017-05-12 NOTE — Telephone Encounter (Signed)
Rx done. 

## 2017-05-18 DIAGNOSIS — E119 Type 2 diabetes mellitus without complications: Secondary | ICD-10-CM | POA: Diagnosis not present

## 2017-05-18 DIAGNOSIS — Z961 Presence of intraocular lens: Secondary | ICD-10-CM | POA: Diagnosis not present

## 2017-05-26 ENCOUNTER — Other Ambulatory Visit: Payer: Self-pay | Admitting: Family Medicine

## 2017-06-09 ENCOUNTER — Ambulatory Visit: Payer: 59 | Admitting: Family Medicine

## 2017-06-15 NOTE — Progress Notes (Signed)
HPI:  Andrea [name] is a pleasant 62 y.o. here for follow up. Chronic medical problems summarized below were reviewed for changes.  Reports she is doing well for the most part.  She did have one low blood sugar after eating pecan waffle.  She has had no other low blood sugars.  Otherwise her sugars have been good per her report. Denies CP, SOB, DOE, vision changes or swelling in the legs, treatment intolerance or new symptoms. Due for labs.  Reports she did her eye exam in February 2019 with Dr. Gershon Crane.  Last CPE 03/09/2017 HTN: -meds: losartan - hctz, metoprolol  B12 def: -on oral supplementation  HLD/CAD: -meds: crestor, asa, BB, Arb -hx PCI, 3 stents remotely  DM: -meds: asa, amaryl, januvia, metformin -sees Dr. Corrin Parker for eye care  ROS: See pertinent positives and negatives per HPI.  Past Medical History:  Diagnosis Date  . Allergy   . Anemia   . Arthritis   . CAD (coronary artery disease)    has 3 stents  . Cataract   . Diabetes mellitus   . Hx of adenomatous colonic polyps 08/09/2014  . Hyperlipidemia   . Hypertension   . Low back pain     Past Surgical History:  Procedure Laterality Date  . BREAST LUMPECTOMY    . carpal tunnel release both hands     . CESAREAN SECTION     2 times  . COLONOSCOPY    . EYE SURGERY     lens replaced/02/06/15 and 03/09/15  . knee surgery for torn cartillige     right knee  . LASIK  01/12/2014, 03/14/2014  . rotator cuff surgery     right side  . stents  2000  . stents 2001  2001   3 stents  . triger finger repair     both hands, right hand done twice    Family History  Problem Relation Age of Onset  . Heart disease Mother   . Hypertension Mother   . Heart disease Father   . Heart disease Sister   . Pancreatic cancer Paternal Uncle     Social History   Socioeconomic History  . Marital status: Divorced    Spouse name: None  . Number of children: None  . Years of education: None  . Highest education level:  None  Social Needs  . Financial resource strain: None  . Food insecurity - worry: None  . Food insecurity - inability: None  . Transportation needs - medical: None  . Transportation needs - non-medical: None  Occupational History  . None  Tobacco Use  . Smoking status: Former Smoker    Types: E-cigarettes    Last attempt to quit: 06/20/2010    Years since quitting: 6.9  . Smokeless tobacco: Never Used  . Tobacco comment: smoked 1ppd for> 30 years/smokes occasionally,some vapor cigarettes  Substance and Sexual Activity  . Alcohol use: No    Alcohol/week: 0.0 oz  . Drug use: No  . Sexual activity: Yes  Other Topics Concern  . 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  aspirin 81 MG tablet, Take 81 mg by mouth daily.  , Disp: , Rfl:  .  Cholecalciferol (VITAMIN D PO), Take by mouth daily., Disp: , Rfl:  .  glimepiride (AMARYL) 4 MG tablet, TAKE 1 TABLET(4 MG) BY MOUTH DAILY BEFORE BREAKFAST, Disp: 30 tablet, Rfl: 5 .  glucose blood (ONE TOUCH ULTRA TEST) test strip, USE TO CHECK BLOOD SUGAR DAILY AND PRN, Disp: 100 each, Rfl: 11 .  JANUVIA 100 MG tablet, TAKE 1 TABLET(100 MG) BY MOUTH DAILY, Disp: 90 tablet, Rfl: 1 .  losartan-hydrochlorothiazide (HYZAAR) 50-12.5 MG tablet, TAKE 1 TABLET BY MOUTH DAILY, Disp: 90 tablet, Rfl: 1 .  metFORMIN (GLUCOPHAGE) 1000 MG tablet, TAKE 1 TABLET BY MOUTH TWICE DAILY AT 4AM AND 6PM ON WORK DAYS AND AT 7 TO 8 AM AND 6PM ON NON-WORK DAYS, Disp: 60 tablet, Rfl: 5 .  metoprolol tartrate (LOPRESSOR) 50 MG tablet, TAKE 1/2 TABLET BY MOUTH TWICE DAILY, Disp: 90 tablet, Rfl: 1 .  Multiple Vitamins-Minerals (OSTEO COMPLEX) CAPS, Take by mouth daily.  , Disp: , Rfl:  .  ONETOUCH DELICA LANCETS FINE MISC, 1 Stick by Does not apply route daily., Disp: 100  each, Rfl: 3 .  rosuvastatin (CRESTOR) 10 MG tablet, TAKE 1 TABLET(10 MG) BY MOUTH DAILY, Disp: 90 tablet, Rfl: 1  EXAM:  Vitals:   06/16/17 0824  BP: 116/60  Pulse: 69  Temp: 98.3 F (36.8 C)    Body mass index is 26.78 kg/m.  GENERAL: vitals reviewed and listed above, alert, oriented, appears well hydrated and in no acute distress  HEENT: atraumatic, conjunttiva clear, no obvious abnormalities on inspection of external nose and ears  NECK: no obvious masses on inspection  LUNGS: clear to auscultation bilaterally, no wheezes, rales or rhonchi, good air movement  CV: HRRR, no peripheral edema  MS: moves all extremities without noticeable abnormality  PSYCH: pleasant and cooperative, no obvious depression or anxiety  ASSESSMENT AND PLAN:  Discussed the following assessment and plan:  Type 2 diabetes mellitus with diabetic neuropathy, without long-term current use of insulin (HCC) - Plan: Hemoglobin A1c  Hyperlipidemia associated with type 2 diabetes mellitus (HCC)  Atherosclerosis of native coronary artery of native heart without angina pectoris  Hypoglycemia  B12 deficiency  Hypertension associated with diabetes ([place]) - Plan: Basic metabolic panel, CBC  -Discussed hypoglycemia and management -Advised healthy, stable, low sugar diet -Labs today -Advised assistant to obtain an abstract eye report -Follow-up in 3-4 months   Patient Instructions  BEFORE YOU LEAVE: -Labs -Obtain an abstract Eye report -follow up: 3-4 months  We have ordered labs or studies at this visit. It can take up to 1-2 weeks for results and processing. IF results require follow up or explanation, we will call you with instructions. Clinically stable results will be released to your [facility]. If you have not heard from [place] or cannot find your results in [facility] in 2 weeks please contact our office at [phone].  If you are not yet signed up for [facility], please consider signing up.   We  recommend the following healthy lifestyle for LIFE: 1) Small portions. But, make sure to get regular (at least 3 per day), healthy meals and small healthy snacks if needed.  2) Eat a healthy clean diet.   TRY TO EAT: -at least 5-7 servings of low sugar, colorful, and nutrient rich vegetables per day (not corn, potatoes or bananas.) -berries are the best choice if you wish to eat fruit (only eat small amounts if trying to reduce weight)  -lean meets (fish, white meat of chicken or [place]) -vegan proteins for  some meals - beans or tofu, whole grains, nuts and seeds -Replace bad fats with good fats - good fats include: fish, nuts and seeds, canola oil, olive oil -small amounts of low fat or non fat dairy -small amounts of100 % whole grains - check the lables -drink plenty of water  AVOID: -SUGAR, sweets, anything with added sugar, corn syrup or sweeteners - must read labels as even foods advertised as "healthy" often are loaded with sugar -if you must have a sweetener, small amounts of stevia may be best -sweetened beverages and artificially sweetened beverages -simple starches (rice, bread, potatoes, pasta, chips, etc - small amounts of 100% whole grains are ok) -red meat, pork, butter -fried foods, fast food, processed food, excessive dairy, eggs and coconut.  3)Get at least 150 minutes of sweaty aerobic exercise per week.  4)Reduce stress - consider counseling, meditation and relaxation to balance other aspects of your life.   Hypoglycemia Hypoglycemia is when the sugar (glucose) level in the blood is too low. Symptoms of low blood sugar may include:  Feeling: ? Hungry. ? Worried or nervous (anxious). ? Sweaty and clammy. ? Confused. ? Dizzy. ? Sleepy. ? Sick to your stomach (nauseous).  Having: ? A fast heartbeat. ? A headache. ? A change in your vision. ? Jerky movements that you cannot control (seizure). ? Nightmares. ? Tingling or no feeling (numbness) around the  mouth, lips, or tongue.  Having trouble with: ? Talking. ? Paying attention (concentrating). ? Moving (coordination). ? Sleeping.  Shaking.  Passing out (fainting).  Getting upset easily (irritability).  Low blood sugar can happen to people who have diabetes and people who do not have diabetes. Low blood sugar can happen quickly, and it can be an emergency. Treating Low Blood Sugar Low blood sugar is often treated by eating or drinking something sugary right away. If you can think clearly and swallow safely, follow the 15:15 rule:  Take 15 grams of a fast-acting carb (carbohydrate). Some fast-acting carbs are: ? 1 tube of glucose gel. ? 3 sugar tablets (glucose pills). ? 6-8 pieces of hard candy. ? 4 oz (120 mL) of fruit juice. ? 4 oz (120 mL) of regular (not diet) soda.  Check your blood sugar 15 minutes after you take the carb.  If your blood sugar is still at or below 70 mg/dL (3.9 mmol/L), take 15 grams of a carb again.  If your blood sugar does not go above 70 mg/dL (3.9 mmol/L) after 3 tries, get help right away.  After your blood sugar goes back to normal, eat a meal or a snack within 1 hour.  Treating Very Low Blood Sugar If your blood sugar is at or below 54 mg/dL (3 mmol/L), you have very low blood sugar (severe hypoglycemia). This is an emergency. Do not wait to see if the symptoms will go away. Get medical help right away. Call your local emergency services (911 in the [place] Do not drive yourself to the hospital. If you have very low blood sugar and you cannot eat or drink, you may need a glucagon shot (injection). A family member or friend should learn how to check your blood sugar and how to give you a glucagon shot. Ask your doctor if you need to have a glucagon shot kit at home. Follow these instructions at home: General instructions  Avoid any diets that cause you to not eat enough food. Talk with your doctor before you start any new diet.  Take  over-the-counter  and prescription medicines only as told by your doctor.  Limit alcohol to no more than 1 drink per day for nonpregnant women and 2 drinks per day for men. One drink equals 12 oz of beer, 5 oz of wine, or 1 oz of hard liquor.  Keep all follow-up visits as told by your doctor. This is important. If You Have Diabetes:   Make sure you know the symptoms of low blood sugar.  Always keep a source of sugar with you, such as: ? Sugar. ? Sugar tablets. ? Glucose gel. ? Fruit juice. ? Regular soda (not diet soda). ? Milk. ? Hard candy. ? Honey.  Take your medicines as told.  Follow your exercise and meal plan. ? Eat on time. Do not skip meals. ? Follow your sick day plan when you cannot eat or drink normally. Make this plan ahead of time with your doctor.  Check your blood sugar as often as told by your doctor. Always check before and after exercise.  Share your diabetes care plan with: ? Your work or school. ? People you live with.  Check your pee (urine) for ketones: ? When you are sick. ? As told by your doctor.  Carry a card or wear jewelry that says you have diabetes. If You Have Low Blood Sugar From Other Causes:   Check your blood sugar as often as told by your doctor.  Follow instructions from your doctor about what you cannot eat or drink. Contact a doctor if:  You have trouble keeping your blood sugar in your target range.  You have low blood sugar often. Get help right away if:  You still have symptoms after you eat or drink something sugary.  Your blood sugar is at or below 54 mg/dL (3 mmol/L).  You have jerky movements that you cannot control.  You pass out. These symptoms may be an emergency. Do not wait to see if the symptoms will go away. Get medical help right away. Call your local emergency services (911 in the [place] Do not drive yourself to the hospital. This information is not intended to replace advice given to you by your health  care provider. Make sure you discuss any questions you have with your health care provider. Document Released: 06/25/2009 Document Revised: 09/06/2015 Document Reviewed: 05/04/2015 Elsevier Interactive Patient Education  2018 [place], [place]

## 2017-06-16 ENCOUNTER — Ambulatory Visit: Payer: 59 | Admitting: Family Medicine

## 2017-06-16 ENCOUNTER — Encounter: Payer: Self-pay | Admitting: Family Medicine

## 2017-06-16 VITALS — BP 116/60 | HR 69 | Temp 98.3°F | Ht 63.25 in | Wt 152.4 lb

## 2017-06-16 DIAGNOSIS — E1159 Type 2 diabetes mellitus with other circulatory complications: Secondary | ICD-10-CM | POA: Diagnosis not present

## 2017-06-16 DIAGNOSIS — E1169 Type 2 diabetes mellitus with other specified complication: Secondary | ICD-10-CM

## 2017-06-16 DIAGNOSIS — E162 Hypoglycemia, unspecified: Secondary | ICD-10-CM

## 2017-06-16 DIAGNOSIS — E785 Hyperlipidemia, unspecified: Secondary | ICD-10-CM | POA: Diagnosis not present

## 2017-06-16 DIAGNOSIS — I2510 Atherosclerotic heart disease of native coronary artery without angina pectoris: Secondary | ICD-10-CM

## 2017-06-16 DIAGNOSIS — I10 Essential (primary) hypertension: Secondary | ICD-10-CM

## 2017-06-16 DIAGNOSIS — E538 Deficiency of other specified B group vitamins: Secondary | ICD-10-CM

## 2017-06-16 DIAGNOSIS — E1140 Type 2 diabetes mellitus with diabetic neuropathy, unspecified: Secondary | ICD-10-CM

## 2017-06-16 DIAGNOSIS — I152 Hypertension secondary to endocrine disorders: Secondary | ICD-10-CM

## 2017-06-16 LAB — BASIC METABOLIC PANEL
BUN: 27 mg/dL — AB (ref 6–23)
CALCIUM: 9.5 mg/dL (ref 8.4–10.5)
CO2: 27 mEq/L (ref 19–32)
Chloride: 103 mEq/L (ref 96–112)
Creatinine, Ser: 1.19 mg/dL (ref 0.40–1.20)
GFR: 48.54 mL/min — AB (ref >60.00)
GLUCOSE: 121 mg/dL — AB (ref 70–99)
POTASSIUM: 3.9 meq/L (ref 3.5–5.1)
Sodium: 138 mEq/L (ref 135–145)

## 2017-06-16 LAB — CBC
HCT: 36.5 % (ref 36.0–46.0)
Hemoglobin: 12.6 g/dL (ref 12.0–15.0)
MCHC: 34.6 g/dL (ref 30.0–36.0)
MCV: 93.5 fl (ref 78.0–100.0)
Platelets: 250 10*3/uL (ref 150.0–400.0)
RBC: 3.9 Mil/uL (ref 3.87–5.11)
RDW: 13.8 % (ref 11.5–15.5)
WBC: 7.9 10*3/uL (ref 4.0–10.5)

## 2017-06-16 LAB — HEMOGLOBIN A1C: Hgb A1c MFr Bld: 6.8 % — ABNORMAL HIGH (ref 4.6–6.5)

## 2017-06-16 NOTE — Patient Instructions (Signed)
BEFORE YOU LEAVE: -Labs -Obtain an abstract Eye report -follow up: 3-4 months  We have ordered labs or studies at this visit. It can take up to 1-2 weeks for results and processing. IF results require follow up or explanation, we will call you with instructions. Clinically stable results will be released to your [facility]. If you have not heard from [place] or cannot find your results in [facility] in 2 weeks please contact our office at [phone].  If you are not yet signed up for [facility], please consider signing up.   We recommend the following healthy lifestyle for LIFE: 1) Small portions. But, make sure to get regular (at least 3 per day), healthy meals and small healthy snacks if needed.  2) Eat a healthy clean diet.   TRY TO EAT: -at least 5-7 servings of low sugar, colorful, and nutrient rich vegetables per day (not corn, potatoes or bananas.) -berries are the best choice if you wish to eat fruit (only eat small amounts if trying to reduce weight)  -lean meets (fish, white meat of chicken or [place]) -vegan proteins for some meals - beans or tofu, whole grains, nuts and seeds -Replace bad fats with good fats - good fats include: fish, nuts and seeds, canola oil, olive oil -small amounts of low fat or non fat dairy -small amounts of100 % whole grains - check the lables -drink plenty of water  AVOID: -SUGAR, sweets, anything with added sugar, corn syrup or sweeteners - must read labels as even foods advertised as "healthy" often are loaded with sugar -if you must have a sweetener, small amounts of stevia may be best -sweetened beverages and artificially sweetened beverages -simple starches (rice, bread, potatoes, pasta, chips, etc - small amounts of 100% whole grains are ok) -red meat, pork, butter -fried foods, fast food, processed food, excessive dairy, eggs and coconut.  3)Get at least 150 minutes of sweaty aerobic exercise per week.  4)Reduce stress - consider counseling,  meditation and relaxation to balance other aspects of your life.   Hypoglycemia Hypoglycemia is when the sugar (glucose) level in the blood is too low. Symptoms of low blood sugar may include:  Feeling: ? Hungry. ? Worried or nervous (anxious). ? Sweaty and clammy. ? Confused. ? Dizzy. ? Sleepy. ? Sick to your stomach (nauseous).  Having: ? A fast heartbeat. ? A headache. ? A change in your vision. ? Jerky movements that you cannot control (seizure). ? Nightmares. ? Tingling or no feeling (numbness) around the mouth, lips, or tongue.  Having trouble with: ? Talking. ? Paying attention (concentrating). ? Moving (coordination). ? Sleeping.  Shaking.  Passing out (fainting).  Getting upset easily (irritability).  Low blood sugar can happen to people who have diabetes and people who do not have diabetes. Low blood sugar can happen quickly, and it can be an emergency. Treating Low Blood Sugar Low blood sugar is often treated by eating or drinking something sugary right away. If you can think clearly and swallow safely, follow the 15:15 rule:  Take 15 grams of a fast-acting carb (carbohydrate). Some fast-acting carbs are: ? 1 tube of glucose gel. ? 3 sugar tablets (glucose pills). ? 6-8 pieces of hard candy. ? 4 oz (120 mL) of fruit juice. ? 4 oz (120 mL) of regular (not diet) soda.  Check your blood sugar 15 minutes after you take the carb.  If your blood sugar is still at or below 70 mg/dL (3.9 mmol/L), take 15 grams of a carb  again.  If your blood sugar does not go above 70 mg/dL (3.9 mmol/L) after 3 tries, get help right away.  After your blood sugar goes back to normal, eat a meal or a snack within 1 hour.  Treating Very Low Blood Sugar If your blood sugar is at or below 54 mg/dL (3 mmol/L), you have very low blood sugar (severe hypoglycemia). This is an emergency. Do not wait to see if the symptoms will go away. Get medical help right away. Call your local  emergency services (911 in the [place] Do not drive yourself to the hospital. If you have very low blood sugar and you cannot eat or drink, you may need a glucagon shot (injection). A family member or friend should learn how to check your blood sugar and how to give you a glucagon shot. Ask your doctor if you need to have a glucagon shot kit at home. Follow these instructions at home: General instructions  Avoid any diets that cause you to not eat enough food. Talk with your doctor before you start any new diet.  Take over-the-counter and prescription medicines only as told by your doctor.  Limit alcohol to no more than 1 drink per day for nonpregnant women and 2 drinks per day for men. One drink equals 12 oz of beer, 5 oz of wine, or 1 oz of hard liquor.  Keep all follow-up visits as told by your doctor. This is important. If You Have Diabetes:   Make sure you know the symptoms of low blood sugar.  Always keep a source of sugar with you, such as: ? Sugar. ? Sugar tablets. ? Glucose gel. ? Fruit juice. ? Regular soda (not diet soda). ? Milk. ? Hard candy. ? Honey.  Take your medicines as told.  Follow your exercise and meal plan. ? Eat on time. Do not skip meals. ? Follow your sick day plan when you cannot eat or drink normally. Make this plan ahead of time with your doctor.  Check your blood sugar as often as told by your doctor. Always check before and after exercise.  Share your diabetes care plan with: ? Your work or school. ? People you live with.  Check your pee (urine) for ketones: ? When you are sick. ? As told by your doctor.  Carry a card or wear jewelry that says you have diabetes. If You Have Low Blood Sugar From Other Causes:   Check your blood sugar as often as told by your doctor.  Follow instructions from your doctor about what you cannot eat or drink. Contact a doctor if:  You have trouble keeping your blood sugar in your target range.  You have  low blood sugar often. Get help right away if:  You still have symptoms after you eat or drink something sugary.  Your blood sugar is at or below 54 mg/dL (3 mmol/L).  You have jerky movements that you cannot control.  You pass out. These symptoms may be an emergency. Do not wait to see if the symptoms will go away. Get medical help right away. Call your local emergency services (911 in the [place] Do not drive yourself to the hospital. This information is not intended to replace advice given to you by your health care provider. Make sure you discuss any questions you have with your health care provider. Document Released: 06/25/2009 Document Revised: 09/06/2015 Document Reviewed: 05/04/2015 Elsevier Interactive Patient Education  [employer].

## 2017-06-19 ENCOUNTER — Other Ambulatory Visit: Payer: Self-pay | Admitting: Family Medicine

## 2017-07-10 ENCOUNTER — Other Ambulatory Visit: Payer: Self-pay | Admitting: Family Medicine

## 2017-07-13 ENCOUNTER — Encounter: Payer: Self-pay | Admitting: Internal Medicine

## 2017-08-11 ENCOUNTER — Other Ambulatory Visit: Payer: Self-pay | Admitting: Family Medicine

## 2017-08-22 DIAGNOSIS — Z23 Encounter for immunization: Secondary | ICD-10-CM | POA: Diagnosis not present

## 2017-09-09 ENCOUNTER — Other Ambulatory Visit: Payer: Self-pay | Admitting: Family Medicine

## 2017-10-11 NOTE — Progress Notes (Signed)
HPI:  Using dictation device. Unfortunately this device frequently misinterprets words/phrases.  Andrea [name] is a pleasant 62 y.o. here for follow up. Chronic medical problems summarized below were reviewed for changes. Reports doing well. Denies CP, SOB, DOE, treatment intolerance or new symptoms. Colon ca screening w/ Dr. Carlean Purl, eye exam (done feb 2019 Dr. Corrin Parker - not yet abstracted) Due for labs She did not know she was due for colonosocpy  Last CPE 03/09/2017 HTN: -meds: losartan - hctz, metoprolol  B12 def: -on oral supplementation  HLD/CAD: -meds: crestor, asa, BB, Arb -hx PCI, 3 stents remotely  DM: -meds: asa, amaryl, januvia, metformin -sees Dr. Corrin Parker for eye care    ROS: See pertinent positives and negatives per HPI.  Past Medical History:  Diagnosis Date  . Allergy   . Anemia   . Arthritis   . CAD (coronary artery disease)    has 3 stents  . Cataract   . Diabetes mellitus   . Hx of adenomatous colonic polyps 08/09/2014  . Hyperlipidemia   . Hypertension   . Low back pain     Past Surgical History:  Procedure Laterality Date  . BREAST LUMPECTOMY    . carpal tunnel release both hands     . CESAREAN SECTION     2 times  . COLONOSCOPY    . EYE SURGERY     lens replaced/02/06/15 and 03/09/15  . knee surgery for torn cartillige     right knee  . LASIK  01/12/2014, 03/14/2014  . rotator cuff surgery     right side  . stents  2000  . stents 2001  2001   3 stents  . triger finger repair     both hands, right hand done twice    Family History  Problem Relation Age of Onset  . Heart disease Mother   . Hypertension Mother   . Heart disease Father   . Heart disease Sister   . Pancreatic cancer Paternal Uncle     SOCIAL HX: see hpi   Current Outpatient Medications:  .  aspirin 81 MG tablet, Take 81 mg by mouth daily.  , Disp: , Rfl:  .  Cholecalciferol (VITAMIN D PO), Take by mouth daily., Disp: , Rfl:  .  glimepiride (AMARYL) 4  MG tablet, TAKE 1 TABLET(4 MG) BY MOUTH DAILY BEFORE BREAKFAST, Disp: 30 tablet, Rfl: 5 .  glucose blood (ONE TOUCH ULTRA TEST) test strip, USE TO CHECK BLOOD SUGAR DAILY AND PRN, Disp: 100 each, Rfl: 11 .  JANUVIA 100 MG tablet, TAKE 1 TABLET(100 MG) BY MOUTH DAILY, Disp: 90 tablet, Rfl: 2 .  losartan-hydrochlorothiazide (HYZAAR) 50-12.5 MG tablet, TAKE 1 TABLET BY MOUTH DAILY, Disp: 90 tablet, Rfl: 1 .  metFORMIN (GLUCOPHAGE) 1000 MG tablet, TAKE 1 TABLET BY MOUTH TWICE DAILY AT 4AM AND 6PM ON WORK DAYS AND AT 7 TO 8 AM AND 6PM ON NON-WORK DAYS, Disp: 60 tablet, Rfl: 5 .  metoprolol tartrate (LOPRESSOR) 50 MG tablet, TAKE 1/2 TABLET BY MOUTH TWICE DAILY, Disp: 90 tablet, Rfl: 1 .  Multiple Vitamins-Minerals (OSTEO COMPLEX) CAPS, Take by mouth daily.  , Disp: , Rfl:  .  ONETOUCH DELICA LANCETS FINE MISC, 1 Stick by Does not apply route daily., Disp: 100 each, Rfl: 3 .  rosuvastatin (CRESTOR) 10 MG tablet, TAKE 1 TABLET(10 MG) BY MOUTH DAILY, Disp: 90 tablet, Rfl: 1  EXAM:  Vitals:   10/13/17 0900  BP: (!) 120/50  Pulse: 69  Temp: 98.2 F (  36.8 C)    Body mass index is 24.66 kg/m.  GENERAL: vitals reviewed and listed above, alert, oriented, appears well hydrated and in no acute distress  HEENT: atraumatic, conjunttiva clear, no obvious abnormalities on inspection of external nose and ears  NECK: no obvious masses on inspection  LUNGS: clear to auscultation bilaterally, no wheezes, rales or rhonchi, good air movement  CV: HRRR, no peripheral edema  MS: moves all extremities without noticeable abnormality  PSYCH: pleasant and cooperative, no obvious depression or anxiety  ASSESSMENT AND PLAN:  Discussed the following assessment and plan:  Adenomatous polyp of colon, unspecified part of colon  Type 2 diabetes mellitus with diabetic neuropathy, without long-term current use of insulin ([place]) - Plan: Hemoglobin A1c  Hypertension associated with diabetes ([place]) - Plan: Basic  metabolic panel, CBC  Hyperlipidemia associated with type 2 diabetes mellitus ([place])  Atherosclerosis of native coronary artery of native heart without angina pectoris  -Discussed that she is due for her repeat colonoscopy and printed out her letters for her, discussed implications, recommended that she call to schedule today -Labs per orders -Healthy lifestyle advised -Continue current medications pending lab results  -patient reports she had her eye exam, advised assistant to update and abstract -Follow-up in 3 to 4 months  Patient Instructions  BEFORE YOU LEAVE: -[name], abstract and update eye exam from Dr. Gershon Crane -letter from GI -labs -follow up: 3-4 months  CALL the gastroenterology office TODAY to schedule your colonoscopy.  We have ordered labs or studies at this visit. It can take up to 1-2 weeks for results and processing. IF results require follow up or explanation, we will call you with instructions. Clinically stable results will be released to your [facility]. If you have not heard from [place] or cannot find your results in [facility] in 2 weeks please contact our office at [phone].  If you are not yet signed up for [facility], please consider signing up.   We recommend the following healthy lifestyle for LIFE: 1) Small portions. But, make sure to get regular (at least 3 per day), healthy meals and small healthy snacks if needed.  2) Eat a healthy clean diet.   TRY TO EAT: -at least 5-7 servings of low sugar, colorful, and nutrient rich vegetables per day (not corn, potatoes or bananas.) -berries are the best choice if you wish to eat fruit (only eat small amounts if trying to reduce weight)  -lean meets (fish, white meat of chicken or [place]) -vegan proteins for some meals - beans or tofu, whole grains, nuts and seeds -Replace bad fats with good fats - good fats include: fish, nuts and seeds, canola oil, olive oil -small amounts of low fat or non fat dairy -small amounts  of100 % whole grains - check the lables -drink plenty of water  AVOID: -SUGAR, sweets, anything with added sugar, corn syrup or sweeteners - must read labels as even foods advertised as "healthy" often are loaded with sugar -if you must have a sweetener, small amounts of stevia may be best -sweetened beverages and artificially sweetened beverages -simple starches (rice, bread, potatoes, pasta, chips, etc - small amounts of 100% whole grains are ok) -red meat, pork, butter -fried foods, fast food, processed food, excessive dairy, eggs and coconut.  3)Get at least 150 minutes of sweaty aerobic exercise per week.  4)Reduce stress - consider counseling, meditation and relaxation to balance other aspects of your life.         Lucretia Kern,  DO

## 2017-10-13 ENCOUNTER — Ambulatory Visit: Payer: 59 | Admitting: Family Medicine

## 2017-10-13 ENCOUNTER — Encounter: Payer: Self-pay | Admitting: Family Medicine

## 2017-10-13 VITALS — BP 120/50 | HR 69 | Temp 98.2°F | Ht 63.25 in | Wt 140.3 lb

## 2017-10-13 DIAGNOSIS — E1169 Type 2 diabetes mellitus with other specified complication: Secondary | ICD-10-CM

## 2017-10-13 DIAGNOSIS — E1159 Type 2 diabetes mellitus with other circulatory complications: Secondary | ICD-10-CM | POA: Diagnosis not present

## 2017-10-13 DIAGNOSIS — D126 Benign neoplasm of colon, unspecified: Secondary | ICD-10-CM | POA: Diagnosis not present

## 2017-10-13 DIAGNOSIS — I152 Hypertension secondary to endocrine disorders: Secondary | ICD-10-CM

## 2017-10-13 DIAGNOSIS — I10 Essential (primary) hypertension: Secondary | ICD-10-CM | POA: Diagnosis not present

## 2017-10-13 DIAGNOSIS — E785 Hyperlipidemia, unspecified: Secondary | ICD-10-CM

## 2017-10-13 DIAGNOSIS — E1140 Type 2 diabetes mellitus with diabetic neuropathy, unspecified: Secondary | ICD-10-CM

## 2017-10-13 DIAGNOSIS — I2510 Atherosclerotic heart disease of native coronary artery without angina pectoris: Secondary | ICD-10-CM

## 2017-10-13 LAB — CBC
HCT: 41.8 % (ref 36.0–46.0)
HEMOGLOBIN: 14.5 g/dL (ref 12.0–15.0)
MCHC: 34.6 g/dL (ref 30.0–36.0)
MCV: 94.1 fl (ref 78.0–100.0)
PLATELETS: 309 10*3/uL (ref 150.0–400.0)
RBC: 4.44 Mil/uL (ref 3.87–5.11)
RDW: 13.7 % (ref 11.5–15.5)
WBC: 9.2 10*3/uL (ref 4.0–10.5)

## 2017-10-13 LAB — BASIC METABOLIC PANEL
BUN: 24 mg/dL — ABNORMAL HIGH (ref 6–23)
CHLORIDE: 99 meq/L (ref 96–112)
CO2: 28 mEq/L (ref 19–32)
Calcium: 10.5 mg/dL (ref 8.4–10.5)
Creatinine, Ser: 0.99 mg/dL (ref 0.40–1.20)
GFR: 59.97 mL/min — AB (ref >60.00)
Glucose, Bld: 191 mg/dL — ABNORMAL HIGH (ref 70–99)
POTASSIUM: 4.4 meq/L (ref 3.5–5.1)
SODIUM: 139 meq/L (ref 135–145)

## 2017-10-13 LAB — HEMOGLOBIN A1C: HEMOGLOBIN A1C: 6.7 % — AB (ref 4.6–6.5)

## 2017-10-13 NOTE — Patient Instructions (Addendum)
BEFORE YOU LEAVE: -[name], abstract and update eye exam from Dr. Gershon Crane -letter from GI -labs -follow up: 3-4 months  CALL the gastroenterology office TODAY to schedule your colonoscopy.  We have ordered labs or studies at this visit. It can take up to 1-2 weeks for results and processing. IF results require follow up or explanation, we will call you with instructions. Clinically stable results will be released to your [facility]. If you have not heard from [place] or cannot find your results in [facility] in 2 weeks please contact our office at [phone].  If you are not yet signed up for [facility], please consider signing up.   We recommend the following healthy lifestyle for LIFE: 1) Small portions. But, make sure to get regular (at least 3 per day), healthy meals and small healthy snacks if needed.  2) Eat a healthy clean diet.   TRY TO EAT: -at least 5-7 servings of low sugar, colorful, and nutrient rich vegetables per day (not corn, potatoes or bananas.) -berries are the best choice if you wish to eat fruit (only eat small amounts if trying to reduce weight)  -lean meets (fish, white meat of chicken or [place]) -vegan proteins for some meals - beans or tofu, whole grains, nuts and seeds -Replace bad fats with good fats - good fats include: fish, nuts and seeds, canola oil, olive oil -small amounts of low fat or non fat dairy -small amounts of100 % whole grains - check the lables -drink plenty of water  AVOID: -SUGAR, sweets, anything with added sugar, corn syrup or sweeteners - must read labels as even foods advertised as "healthy" often are loaded with sugar -if you must have a sweetener, small amounts of stevia may be best -sweetened beverages and artificially sweetened beverages -simple starches (rice, bread, potatoes, pasta, chips, etc - small amounts of 100% whole grains are ok) -red meat, pork, butter -fried foods, fast food, processed food, excessive dairy, eggs and  coconut.  3)Get at least 150 minutes of sweaty aerobic exercise per week.  4)Reduce stress - consider counseling, meditation and relaxation to balance other aspects of your life.

## 2017-10-14 ENCOUNTER — Other Ambulatory Visit: Payer: Self-pay | Admitting: Family Medicine

## 2017-11-09 ENCOUNTER — Other Ambulatory Visit: Payer: Self-pay | Admitting: Family Medicine

## 2017-11-21 ENCOUNTER — Other Ambulatory Visit: Payer: Self-pay | Admitting: Family Medicine

## 2017-12-11 ENCOUNTER — Other Ambulatory Visit: Payer: Self-pay | Admitting: Family Medicine

## 2018-01-11 DIAGNOSIS — Z23 Encounter for immunization: Secondary | ICD-10-CM | POA: Diagnosis not present

## 2018-02-07 ENCOUNTER — Other Ambulatory Visit: Payer: Self-pay | Admitting: Family Medicine

## 2018-02-11 NOTE — Progress Notes (Signed)
HPI:  Using dictation device. Unfortunately this device frequently misinterprets words/phrases.  Andrea [name] is a pleasant 62 y.o. here for follow up. Chronic medical problems summarized below were reviewed for changes and stability and were updated as needed below. These issues and their treatment remain stable for the most part. Rare occ episodes of sensation in throat/esophagus after eating - tums helps. Sometimes will make her have headache and feel like she cant take a deep breath. Last episode about 1.5 months ago. Not with activity - only with eating. Denies CP, SOB, DOE, treatment intolerance or new symptoms. Due for pap, colon ca screening, eye exam (reported done, advised assistant to obtain last visit),foot exam  Hx colon polyp: -recommended follow up with GI last visit and provided info to do so  Last CPE 03/09/2017 HTN: -meds: losartan - hctz, metoprolol  B12 def: -on oral supplementation  HLD/CAD: -meds: crestor, asa, BB, Arb -hx PCI, 3 stents remotely  DM: -meds: asa, amaryl, januvia, metformin -sees Dr. Corrin Parker for eye care  ROS: See pertinent positives and negatives per HPI.  Past Medical History:  Diagnosis Date  . Allergy   . Anemia   . Arthritis   . CAD (coronary artery disease)    has 3 stents  . Cataract   . Diabetes mellitus   . Hx of adenomatous colonic polyps 08/09/2014  . Hyperlipidemia   . Hypertension   . Low back pain     Past Surgical History:  Procedure Laterality Date  . BREAST LUMPECTOMY    . carpal tunnel release both hands     . CESAREAN SECTION     2 times  . COLONOSCOPY    . EYE SURGERY     lens replaced/02/06/15 and 03/09/15  . knee surgery for torn cartillige     right knee  . LASIK  01/12/2014, 03/14/2014  . rotator cuff surgery     right side  . stents  2000  . stents 2001  2001   3 stents  . triger finger repair     both hands, right hand done twice    Family History  Problem Relation Age of Onset  . Heart  disease Mother   . Hypertension Mother   . Heart disease Father   . Heart disease Sister   . Pancreatic cancer Paternal Uncle     SOCIAL HX: see hpi   Current Outpatient Medications:  .  aspirin 81 MG tablet, Take 81 mg by mouth daily.  , Disp: , Rfl:  .  Cholecalciferol (VITAMIN D PO), Take by mouth daily., Disp: , Rfl:  .  glimepiride (AMARYL) 4 MG tablet, TAKE 1 TABLET(4 MG) BY MOUTH DAILY BEFORE BREAKFAST, Disp: 30 tablet, Rfl: 5 .  glucose blood (ONE TOUCH ULTRA TEST) test strip, USE TO CHECK BLOOD SUGAR DAILY AND AS NEEDED, Disp: 100 each, Rfl: 5 .  JANUVIA 100 MG tablet, TAKE 1 TABLET(100 MG) BY MOUTH DAILY, Disp: 90 tablet, Rfl: 2 .  losartan-hydrochlorothiazide (HYZAAR) 50-12.5 MG tablet, TAKE 1 TABLET BY MOUTH DAILY, Disp: 90 tablet, Rfl: 1 .  metFORMIN (GLUCOPHAGE) 1000 MG tablet, TAKE 1 TABLET BY MOUTH TWICE DAILY AT 4AM AND 6PM ON WORK DAYS AND AT 7 TO 8 AM AND 6PM ON NON-WORK DAYS, Disp: 60 tablet, Rfl: 5 .  metoprolol tartrate (LOPRESSOR) 50 MG tablet, TAKE 1/2 TABLET BY MOUTH TWICE DAILY, Disp: 90 tablet, Rfl: 1 .  Multiple Vitamins-Minerals (OSTEO COMPLEX) CAPS, Take by mouth daily.  , Disp: ,  Rfl:  .  ONETOUCH DELICA LANCETS FINE MISC, 1 Stick by Does not apply route daily., Disp: 100 each, Rfl: 3 .  rosuvastatin (CRESTOR) 10 MG tablet, TAKE 1 TABLET(10 MG) BY MOUTH DAILY, Disp: 90 tablet, Rfl: 1  EXAM:  Vitals:   02/15/18 0845  BP: 128/60  Pulse: (!) 58  Temp: 98.1 F (36.7 C)  SpO2: 95%    Body mass index is 26.47 kg/m.  GENERAL: vitals reviewed and listed above, alert, oriented, appears well hydrated and in no acute distress  HEENT: atraumatic, conjunttiva clear, no obvious abnormalities on inspection of external nose and ears  NECK: no obvious masses on inspection  LUNGS: clear to auscultation bilaterally, no wheezes, rales or rhonchi, good air movement  CV: HRRR, no peripheral edema  MS: moves all extremities without noticeable  abnormality  PSYCH: pleasant and cooperative, no obvious depression or anxiety  ASSESSMENT AND PLAN:  Discussed the following assessment and plan:  Hx of adenomatous colonic polyps  Type 2 diabetes mellitus with diabetic neuropathy, without long-term current use of insulin ([facility])  Hyperlipidemia, unspecified hyperlipidemia type  Atherosclerosis of native coronary artery of native heart without angina pectoris  -schedule physical with fasting labs and pap -lifestyle recs -treatment gerd discussed -again advised she call GI to set up colonoscopy and also let them know about the reflux issues; trial ppi low dose if recurs, discussed other potential etiologies and advised to follow up if recurrent or worsening -foot exam done - discussed management options for onychomycosis, declined referral for now and wants to try topical  -Patient advised to return or notify a doctor immediately if symptoms worsen or persist or new concerns arise.  Patient Instructions  BEFORE YOU LEAVE: -number to call GI -eye exam -follow up: CPE in 3 months with pap smear and FASTING labs  CALL your gastroenterologist office today to schedule follow up for the polyp and for the acid reflux.  Try nexium or prilosec for a few days, once daily, if heartburn recurs.   We recommend the following healthy lifestyle for LIFE: 1) Small portions. But, make sure to get regular (at least 3 per day), healthy meals and small healthy snacks if needed.  2) Eat a healthy clean diet.   TRY TO EAT: -at least 5-7 servings of low sugar, colorful, and nutrient rich vegetables per day (not corn, potatoes or bananas.) -berries are the best choice if you wish to eat fruit (only eat small amounts if trying to reduce weight)  -lean meets (fish, white meat of chicken or [place]) -vegan proteins for some meals - beans or tofu, whole grains, nuts and seeds -Replace bad fats with good fats - good fats include: fish, nuts and seeds,  canola oil, olive oil -small amounts of low fat or non fat dairy -small amounts of100 % whole grains - check the lables -drink plenty of water  AVOID: -SUGAR, sweets, anything with added sugar, corn syrup or sweeteners - must read labels as even foods advertised as "healthy" often are loaded with sugar -if you must have a sweetener, small amounts of stevia may be best -sweetened beverages and artificially sweetened beverages -simple starches (rice, bread, potatoes, pasta, chips, etc - small amounts of 100% whole grains are ok) -red meat, pork, butter -fried foods, fast food, processed food, excessive dairy, eggs and coconut.  3)Get at least 150 minutes of sweaty aerobic exercise per week.  4)Reduce stress - consider counseling, meditation and relaxation to balance other aspects of your life.  Shikha Bibb R [place], DO   

## 2018-02-15 ENCOUNTER — Ambulatory Visit: Payer: 59 | Admitting: Family Medicine

## 2018-02-15 ENCOUNTER — Encounter: Payer: Self-pay | Admitting: Family Medicine

## 2018-02-15 VITALS — BP 128/60 | HR 58 | Temp 98.1°F | Ht 63.25 in | Wt 150.6 lb

## 2018-02-15 DIAGNOSIS — I2510 Atherosclerotic heart disease of native coronary artery without angina pectoris: Secondary | ICD-10-CM | POA: Diagnosis not present

## 2018-02-15 DIAGNOSIS — E785 Hyperlipidemia, unspecified: Secondary | ICD-10-CM | POA: Diagnosis not present

## 2018-02-15 DIAGNOSIS — E1140 Type 2 diabetes mellitus with diabetic neuropathy, unspecified: Secondary | ICD-10-CM

## 2018-02-15 DIAGNOSIS — Z86010 Personal history of colonic polyps: Secondary | ICD-10-CM | POA: Diagnosis not present

## 2018-02-15 NOTE — Patient Instructions (Signed)
BEFORE YOU LEAVE: -number to call GI -eye exam -follow up: CPE in 3 months with pap smear and FASTING labs  CALL your gastroenterologist office today to schedule follow up for the polyp and for the acid reflux.  Try nexium or prilosec for a few days, once daily, if heartburn recurs.   We recommend the following healthy lifestyle for LIFE: 1) Small portions. But, make sure to get regular (at least 3 per day), healthy meals and small healthy snacks if needed.  2) Eat a healthy clean diet.   TRY TO EAT: -at least 5-7 servings of low sugar, colorful, and nutrient rich vegetables per day (not corn, potatoes or bananas.) -berries are the best choice if you wish to eat fruit (only eat small amounts if trying to reduce weight)  -lean meets (fish, white meat of chicken or [place]) -vegan proteins for some meals - beans or tofu, whole grains, nuts and seeds -Replace bad fats with good fats - good fats include: fish, nuts and seeds, canola oil, olive oil -small amounts of low fat or non fat dairy -small amounts of100 % whole grains - check the lables -drink plenty of water  AVOID: -SUGAR, sweets, anything with added sugar, corn syrup or sweeteners - must read labels as even foods advertised as "healthy" often are loaded with sugar -if you must have a sweetener, small amounts of stevia may be best -sweetened beverages and artificially sweetened beverages -simple starches (rice, bread, potatoes, pasta, chips, etc - small amounts of 100% whole grains are ok) -red meat, pork, butter -fried foods, fast food, processed food, excessive dairy, eggs and coconut.  3)Get at least 150 minutes of sweaty aerobic exercise per week.  4)Reduce stress - consider counseling, meditation and relaxation to balance other aspects of your life.

## 2018-02-16 ENCOUNTER — Encounter: Payer: Self-pay | Admitting: Internal Medicine

## 2018-03-30 ENCOUNTER — Encounter: Payer: Self-pay | Admitting: Internal Medicine

## 2018-03-30 ENCOUNTER — Ambulatory Visit (AMBULATORY_SURGERY_CENTER): Payer: Self-pay | Admitting: *Deleted

## 2018-03-30 VITALS — Ht 63.25 in | Wt 148.0 lb

## 2018-03-30 DIAGNOSIS — Z86010 Personal history of colonic polyps: Secondary | ICD-10-CM

## 2018-03-30 NOTE — Progress Notes (Signed)
No egg or soy allergy known to patient  No issues with past sedation with any surgeries  or procedures, no intubation problems  No diet pills per patient No home 02 use per patient  No blood thinners per patient  Pt denies issues with constipation  No A fib or A flutter  EMMI video sent to pt's e mail pt declined   

## 2018-04-13 ENCOUNTER — Encounter: Payer: 59 | Admitting: Internal Medicine

## 2018-04-29 ENCOUNTER — Encounter: Payer: Self-pay | Admitting: Internal Medicine

## 2018-04-29 ENCOUNTER — Ambulatory Visit (AMBULATORY_SURGERY_CENTER): Payer: 59 | Admitting: Internal Medicine

## 2018-04-29 VITALS — BP 140/77 | HR 58 | Temp 96.6°F | Resp 24 | Ht 63.0 in | Wt 148.0 lb

## 2018-04-29 DIAGNOSIS — E119 Type 2 diabetes mellitus without complications: Secondary | ICD-10-CM | POA: Diagnosis not present

## 2018-04-29 DIAGNOSIS — Z86010 Personal history of colonic polyps: Secondary | ICD-10-CM | POA: Diagnosis not present

## 2018-04-29 DIAGNOSIS — K635 Polyp of colon: Secondary | ICD-10-CM

## 2018-04-29 DIAGNOSIS — I2510 Atherosclerotic heart disease of native coronary artery without angina pectoris: Secondary | ICD-10-CM | POA: Diagnosis not present

## 2018-04-29 DIAGNOSIS — D125 Benign neoplasm of sigmoid colon: Secondary | ICD-10-CM

## 2018-04-29 MED ORDER — SODIUM CHLORIDE 0.9 % IV SOLN: 500.0000 mL | Freq: Once | INTRAVENOUS | Status: DC

## 2018-04-29 NOTE — Op Note (Signed)
[place] Patient Name: Andrea [name] Procedure Date: 04/29/2018 7:37 AM MRN: [id number] Endoscopist: Gatha Mayer , MD Age: 63 Referring MD:  Date of Birth: 06-06-1955 Gender: Female Account #: [id number] Procedure:                Colonoscopy Indications:              Surveillance: Personal history of adenomatous                            polyps on last colonoscopy > 3 years ago Medicines:                Propofol per Anesthesia, Monitored Anesthesia Care Procedure:                Pre-Anesthesia Assessment:                           - Prior to the procedure, a History and Physical                            was performed, and patient medications and                            allergies were reviewed. The patient's tolerance of                            previous anesthesia was also reviewed. The risks                            and benefits of the procedure and the sedation                            options and risks were discussed with the patient.                            All questions were answered, and informed consent                            was obtained. Prior Anticoagulants: The patient has                            taken no previous anticoagulant or antiplatelet                            agents. ASA Grade Assessment: II - A patient with                            mild systemic disease. After reviewing the risks                            and benefits, the patient was deemed in                            satisfactory condition to undergo the procedure.  After obtaining informed consent, the colonoscope                            was passed under direct vision. Throughout the                            procedure, the patient's blood pressure, pulse, and                            oxygen saturations were monitored continuously. The                            Colonoscope was introduced through the anus and   advanced to the the cecum, identified by                            appendiceal orifice and ileocecal valve. The                            colonoscopy was performed without difficulty. The                            patient tolerated the procedure well. The quality                            of the bowel preparation was excellent. The                            ileocecal valve, appendiceal orifice, and rectum                            were photographed. Scope In: 9:56:38 AM Scope Out: 8:00:43 AM Scope Withdrawal Time: 0 hours 10 minutes 48 seconds  Total Procedure Duration: 0 hours 13 minutes 54 seconds  Findings:                 The perianal and digital rectal examinations were                            normal.                           A 6 mm polyp was found in the sigmoid colon. The                            polyp was sessile. The polyp was removed with a                            cold snare. Resection and retrieval were complete.                            Verification of patient identification for the                            specimen was done. Estimated blood loss was  minimal.                           The exam was otherwise without abnormality on                            direct and retroflexion views. Complications:            No immediate complications. Impression:               - One 6 mm polyp in the sigmoid colon, removed with                            a cold snare. Resected and retrieved.                           - The examination was otherwise normal on direct                            and retroflexion views. Recommendation:           - Patient has a contact number available for                            emergencies. The signs and symptoms of potential                            delayed complications were discussed with the                            patient. Return to normal activities tomorrow.                            Written discharge instructions were  provided to the                            patient.                           - Resume previous diet.                           - Continue present medications.                           - Repeat colonoscopy in 5 years for surveillance. Gatha Mayer, MD 04/29/2018 8:11:31 AM This report has been signed electronically.

## 2018-04-29 NOTE — Progress Notes (Signed)
Computer system is not printing.  Patient has written instructions for discharge and handouts. Will send procedure report in mail.

## 2018-04-29 NOTE — Patient Instructions (Addendum)
I found and removed one small colon polyp today. I will let you know pathology results and when to have another routine colonoscopy by mail and/or My Chart. Probably 5 years this time.  I appreciate the opportunity to care for you. Gatha Mayer, MD, FACG YOU HAD AN ENDOSCOPIC PROCEDURE TODAY AT [facility]:   Refer to the procedure report that was given to you for any specific questions about what was found during the examination.  If the procedure report does not answer your questions, please call your gastroenterologist to clarify.  If you requested that your care partner not be given the details of your procedure findings, then the procedure report has been included in a sealed envelope for you to review at your convenience later.  YOU SHOULD EXPECT: Some feelings of bloating in the abdomen. Passage of more gas than usual.  Walking can help get rid of the air that was put into your GI tract during the procedure and reduce the bloating. If you had a lower endoscopy (such as a colonoscopy or flexible sigmoidoscopy) you may notice spotting of blood in your stool or on the toilet paper. If you underwent a bowel prep for your procedure, you may not have a normal bowel movement for a few days.  Please Note:  You might notice some irritation and congestion in your nose or some drainage.  This is from the oxygen used during your procedure.  There is no need for concern and it should clear up in a day or so.  SYMPTOMS TO REPORT IMMEDIATELY:   Following lower endoscopy (colonoscopy or flexible sigmoidoscopy):  Excessive amounts of blood in the stool  Significant tenderness or worsening of abdominal pains  Swelling of the abdomen that is new, acute  Fever of 100F or higher   For urgent or emergent issues, a gastroenterologist can be reached at any hour by calling [phone].   DIET:  We do recommend a small meal at first, but then you may proceed to your regular  diet.  Drink plenty of fluids but you should avoid alcoholic beverages for 24 hours.  ACTIVITY:  You should plan to take it easy for the rest of today and you should NOT DRIVE or use heavy machinery until tomorrow (because of the sedation medicines used during the test).    FOLLOW UP: Our staff will call the number listed on your records the next business day following your procedure to check on you and address any questions or concerns that you may have regarding the information given to you following your procedure. If we do not reach you, we will leave a message.  However, if you are feeling well and you are not experiencing any problems, there is no need to return our call.  We will assume that you have returned to your regular daily activities without incident.  If any biopsies were taken you will be contacted by phone or by letter within the next 1-3 weeks.  Please call us at [phone] if you have not heard about the biopsies in 3 weeks.    SIGNATURES/CONFIDENTIALITY: You and/or your care partner have signed paperwork which will be entered into your electronic medical record.  These signatures attest to the fact that that the information above on your After Visit Summary has been reviewed and is understood.  Full responsibility of the confidentiality of this discharge information lies with you and/or your care-partner.  You're procedure report was printed in recovery.  Read all handouts given to you by your recovery room nurse.

## 2018-04-29 NOTE — Progress Notes (Signed)
Called to room to assist during endoscopic procedure.  Patient ID and intended procedure confirmed with present staff. Received instructions for my participation in the procedure from the performing physician.  

## 2018-04-29 NOTE — Progress Notes (Signed)
Report given to PACU, vss 

## 2018-04-30 ENCOUNTER — Telehealth: Payer: Self-pay | Admitting: *Deleted

## 2018-04-30 NOTE — Telephone Encounter (Signed)
  Follow up Call-  Call back number 04/29/2018  Post procedure Call Back phone  # [phone]  Permission to leave phone message Yes  Some recent data might be hidden     Patient questions:  Message left to call us if necessary.

## 2018-04-30 NOTE — Telephone Encounter (Signed)
  Follow up Call-  Call back number 04/29/2018  Post procedure Call Back phone  # [phone]  Permission to leave phone message Yes  Some recent data might be hidden     Patient questions:  Do you have a fever, pain , or abdominal swelling? No. Pain Score  0 *  Have you tolerated food without any problems? Yes.    Have you been able to return to your normal activities? Yes.    Do you have any questions about your discharge instructions: Diet   No. Medications  No. Follow up visit  No.  Do you have questions or concerns about your Care? No.  Actions: * If pain score is 4 or above: No action needed, pain <4.

## 2018-05-08 ENCOUNTER — Other Ambulatory Visit: Payer: Self-pay | Admitting: Family Medicine

## 2018-05-12 ENCOUNTER — Emergency Department (HOSPITAL_COMMUNITY): Payer: 59

## 2018-05-12 ENCOUNTER — Encounter (HOSPITAL_COMMUNITY): Payer: Self-pay | Admitting: Emergency Medicine

## 2018-05-12 ENCOUNTER — Emergency Department (HOSPITAL_COMMUNITY)
Admission: EM | Admit: 2018-05-12 | Discharge: 2018-05-12 | Disposition: A | Discharge status: Home / Self Care | Payer: 59 | Attending: Emergency Medicine | Admitting: Emergency Medicine

## 2018-05-12 DIAGNOSIS — I10 Essential (primary) hypertension: Secondary | ICD-10-CM | POA: Diagnosis not present

## 2018-05-12 DIAGNOSIS — Z7982 Long term (current) use of aspirin: Secondary | ICD-10-CM | POA: Diagnosis not present

## 2018-05-12 DIAGNOSIS — E119 Type 2 diabetes mellitus without complications: Secondary | ICD-10-CM | POA: Diagnosis not present

## 2018-05-12 DIAGNOSIS — R0789 Other chest pain: Secondary | ICD-10-CM

## 2018-05-12 DIAGNOSIS — Z87891 Personal history of nicotine dependence: Secondary | ICD-10-CM | POA: Diagnosis not present

## 2018-05-12 DIAGNOSIS — Z79899 Other long term (current) drug therapy: Secondary | ICD-10-CM | POA: Insufficient documentation

## 2018-05-12 LAB — CBC
HCT: 37.4 % (ref 36.0–46.0)
Hemoglobin: 12.8 g/dL (ref 12.0–15.0)
MCH: 32.2 pg (ref 26.0–34.0)
MCHC: 34.2 g/dL (ref 30.0–36.0)
MCV: 94 fL (ref 80.0–100.0)
PLATELETS: 238 10*3/uL (ref 150–400)
RBC: 3.98 MIL/uL (ref 3.87–5.11)
RDW: 13.2 % (ref 11.5–15.5)
WBC: 6.7 10*3/uL (ref 4.0–10.5)
nRBC: 0 % (ref 0.0–0.2)

## 2018-05-12 LAB — BASIC METABOLIC PANEL
Anion gap: 15 (ref 5–15)
BUN: 14 mg/dL (ref 8–23)
CALCIUM: 9.6 mg/dL (ref 8.9–10.3)
CO2: 23 mmol/L (ref 22–32)
Chloride: 100 mmol/L (ref 98–111)
Creatinine, Ser: 0.97 mg/dL (ref 0.44–1.00)
GFR calc Af Amer: >60 mL/min (ref >60)
GFR calc non Af Amer: >60 mL/min (ref >60)
Glucose, Bld: 257 mg/dL — ABNORMAL HIGH (ref 70–99)
Potassium: 3.3 mmol/L — ABNORMAL LOW (ref 3.5–5.1)
Sodium: 138 mmol/L (ref 135–145)

## 2018-05-12 LAB — I-STAT TROPONIN, ED: TROPONIN I, POC: 0 ng/mL (ref 0.00–0.08)

## 2018-05-12 MED ORDER — PSEUDOEPH-BROMPHEN-DM 30-2-10 MG/5ML PO SYRP: 5.0000 mL | ORAL_SOLUTION | Freq: Four times a day (QID) | ORAL | 0 refills | Status: DC | PRN

## 2018-05-12 NOTE — Discharge Instructions (Addendum)
Thank you for allowing me to care for you today. Please return to the emergency department if you have new or worsening symptoms. Take your medications as instructed.  ° °

## 2018-05-12 NOTE — ED Provider Notes (Addendum)
[facility] EMERGENCY DEPARTMENT Provider Note   CSN: [id number] Arrival date & time: 05/12/18  1031     History   Chief Complaint Chief Complaint  Patient presents with  . Chest Pain    HPI Andrea [name] is a 63 y.o. female.  Patient is a 63 y/o F with PMh DM, CAD, HTN, arthritis, HLD who presents to the ED for complaints of left sided chest pain. Patient reports she has had a "cold" since Christmas 2019 and that she has had a continuous dry cough. She reports that she is still having post nasal drip that is causing a cough. She noticed over the weekend that when she coughs, she gets a sharp pain in the left side. She came in today because the pain had moved from the side of her chest to the anterior left chest. It is still described as sharp, non-radiating. Worse with coughing and laughing. Relieved with rest. Denies SOB, nausea, arm pain, leg swelling, fever, hemoptysis, dizziness. Has had 3 cardiac stents placed in 2002 but has not seen cardiology since then.   The history is provided by the patient.  Chest Pain  Pain location:  L chest Pain quality: sharp   Pain radiates to:  Does not radiate Pain severity now: currently not painful. Onset quality:  Gradual Duration:  5 days Timing:  Intermittent Worsened by:  Coughing Associated symptoms: cough   Associated symptoms: no abdominal pain, no back pain, no dizziness, no headache, no nausea, no palpitations, no shortness of breath and no vomiting     Past Medical History:  Diagnosis Date  . Allergy   . Anemia   . Arthritis   . CAD (coronary artery disease)    has 3 stents  . Cataract    removed both eyes   . Diabetes mellitus   . Hx of adenomatous colonic polyps 08/09/2014  . Hyperlipidemia   . Hypertension   . Low back pain     Patient Active Problem List   Diagnosis Date Noted  . Type 2 diabetes mellitus with diabetic neuropathy, without long-term current use of insulin ([place]) 05/24/2015  . Hx  of adenomatous colonic polyps 08/09/2014  . OSTEOARTHRITIS 05/07/2007  . Hyperlipemia 11/19/2006  . Coronary atherosclerosis 11/09/2006    Past Surgical History:  Procedure Laterality Date  . BREAST LUMPECTOMY    . carpal tunnel release both hands     . CESAREAN SECTION     2 times  . COLONOSCOPY     x 2- hx colon polyps-   . EYE SURGERY     lens replaced/02/06/15 and 03/09/15  . knee surgery for torn cartillige     right knee  . LASIK  01/12/2014, 03/14/2014  . POLYPECTOMY    . rotator cuff surgery     right side  . stents  2000  . stents 2001  2001   3 stents  . triger finger repair     both hands, right hand done twice  . TUBAL LIGATION       OB History   No obstetric history on file.      Home Medications    Prior to Admission medications   Medication Sig Start Date End Date Taking? Authorizing Provider  aspirin 81 MG tablet Take 81 mg by mouth daily.     Yes [provider]  glimepiride (AMARYL) 4 MG tablet TAKE 1 TABLET(4 MG) BY MOUTH DAILY BEFORE BREAKFAST Patient taking differently: Take 4 mg by  mouth daily with breakfast. TAKE 1 TABLET(4 MG) BY MOUTH DAILY BEFORE BREAKFAST 05/10/18  Yes Kim, Hannah R, DO  JANUVIA 100 MG tablet TAKE 1 TABLET(100 MG) BY MOUTH DAILY Patient taking differently: Take 100 mg by mouth daily.  09/09/17  Yes Lucretia Kern, DO  losartan-hydrochlorothiazide (HYZAAR) 50-12.5 MG tablet TAKE 1 TABLET BY MOUTH DAILY 11/23/17  Yes Colin Benton R, DO  metFORMIN (GLUCOPHAGE) 1000 MG tablet TAKE 1 TABLET BY MOUTH TWICE DAILY AT 4AM AND 6PM ON WORK DAYS AND AT 7 TO 8 AM AND 6PM ON NON-WORK DAYS Patient taking differently: Take 1,000 mg by mouth 2 (two) times daily with a meal.  02/08/18  Yes Colin Benton R, DO  metoprolol tartrate (LOPRESSOR) 50 MG tablet TAKE 1/2 TABLET BY MOUTH TWICE DAILY Patient taking differently: Take 25 mg by mouth 2 (two) times daily.  12/11/17  Yes Colin Benton R, DO  rosuvastatin (CRESTOR) 10 MG tablet TAKE 1  TABLET(10 MG) BY MOUTH DAILY Patient taking differently: Take 10 mg by mouth daily.  02/08/18  Yes Lucretia Kern, DO  vitamin B-12 (CYANOCOBALAMIN) 1000 MCG tablet Take 1,000 mcg by mouth daily.   Yes [provider]  brompheniramine-pseudoephedrine-DM 30-2-10 MG/5ML syrup Take 5 mLs by mouth 4 (four) times daily as needed. 05/12/18   Madilyn Hook A, PA-C  glucose blood (ONE TOUCH ULTRA TEST) test strip USE TO CHECK BLOOD SUGAR DAILY AND AS NEEDED 10/14/17   Lucretia Kern, DO  ONETOUCH DELICA LANCETS FINE MISC 1 Stick by Does not apply route daily. 06/08/13   Ricard Dillon, MD    Family History Family History  Problem Relation Age of Onset  . Heart disease Mother   . Hypertension Mother   . Heart disease Father   . Heart disease Sister   . Pancreatic cancer Paternal Uncle   . Colitis Neg Hx   . Colon cancer Neg Hx   . Esophageal cancer Neg Hx   . Rectal cancer Neg Hx   . Stomach cancer Neg Hx     Social History Social History   Tobacco Use  . Smoking status: Former Smoker    Types: E-cigarettes    Last attempt to quit: 06/20/2010    Years since quitting: 7.8  . Smokeless tobacco: Never Used  . Tobacco comment: smoked 1ppd for> 30 years/smokes occasionally,some vapor cigarettes  Substance Use Topics  . Alcohol use: No    Alcohol/week: 0.0 standard drinks  . Drug use: No     Allergies   Plavix [clopidogrel bisulfate]   Review of Systems Review of Systems  Constitutional: Negative.   HENT: Positive for congestion, postnasal drip and rhinorrhea. Negative for ear pain, sinus pressure, sinus pain, sneezing and sore throat.   Eyes: Negative for visual disturbance.  Respiratory: Positive for cough. Negative for apnea, choking, chest tightness, shortness of breath, wheezing and stridor.   Cardiovascular: Positive for chest pain. Negative for palpitations and leg swelling.  Gastrointestinal: Negative for abdominal pain, nausea and vomiting.  Genitourinary: Negative for  flank pain.  Musculoskeletal: Negative for back pain.  Skin: Negative for rash.  Neurological: Negative for dizziness, light-headedness and headaches.  Hematological: Does not bruise/bleed easily.  Psychiatric/Behavioral: Negative for confusion. The patient is not nervous/anxious.      Physical Exam Updated Vital Signs BP (!) 134/53   Pulse (!) 54   Temp 98.1 F (36.7 C) (Oral)   Resp (!) 25   SpO2 94%   Physical Exam Vitals signs  and nursing note reviewed.  Constitutional:      General: She is not in acute distress.    Appearance: She is well-developed. She is not ill-appearing, toxic-appearing or diaphoretic.     Comments: Pleasant, cooperative female  HENT:     Head: Normocephalic and atraumatic.  Eyes:     Pupils: Pupils are equal, round, and reactive to light.  Neck:     Vascular: No hepatojugular reflux or JVD.  Cardiovascular:     Rate and Rhythm: Normal rate and regular rhythm.     Heart sounds: No murmur. Gallop present.   Pulmonary:     Effort: Pulmonary effort is normal. No tachypnea.     Breath sounds: Normal breath sounds.  Chest:     Chest wall: No mass or tenderness.     Comments: Patient was able to reproduce her pain when she coughed. Abdominal:     General: Bowel sounds are normal.     Palpations: Abdomen is soft.  Lymphadenopathy:     Cervical: No cervical adenopathy.  Skin:    General: Skin is warm.     Capillary Refill: Capillary refill takes less than 2 seconds.     Coloration: Skin is not cyanotic or pale.  Neurological:     General: No focal deficit present.     Mental Status: She is alert.  Psychiatric:        Mood and Affect: Mood normal. Mood is not anxious.      ED Treatments / Results  Labs (all labs ordered are listed, but only abnormal results are displayed) Labs Reviewed  BASIC METABOLIC PANEL - Abnormal; Notable for the following components:      Result Value   Potassium 3.3 (*)    Glucose, Bld 257 (*)    All other  components within normal limits  CBC  I-STAT TROPONIN, ED    EKG EKG Interpretation  Date/Time:  Wednesday May 12 2018 10:39:17 EST Ventricular Rate:  65 PR Interval:    QRS Duration: 94 QT Interval:  409 QTC Calculation: 426 R Axis:   76 Text Interpretation:  Sinus rhythm Borderline T abnormalities, anterior leads Baseline wander in lead(s) I No significant change since last tracing Confirmed by Duffy Bruce [phone]) on 05/12/2018 10:56:38 AM Also confirmed by Duffy Bruce [phone]), editor Philomena Doheny [phone])  on 05/12/2018 11:40:00 AM   Radiology Dg Chest 2 View  Result Date: 05/12/2018 CLINICAL DATA:  LEFT chest pain. EXAM: CHEST - 2 VIEW COMPARISON:  12/30/2016. FINDINGS: RIGHT lung scarring. LEFT lung clear. Hyperinflation. Normal cardiomediastinal silhouette. No consolidation or edema. No effusion or pneumothorax. Osteopenia. IMPRESSION: No active cardiopulmonary disease. Electronically Signed   By: Staci Righter M.D.   On: 05/12/2018 11:42    Procedures Procedures (including critical care time)  Medications Ordered in ED Medications - No data to display   Initial Impression / Assessment and Plan / ED Course  I have reviewed the triage vital signs and the nursing notes.  Pertinent labs & imaging results that were available during my care of the patient were reviewed by me and considered in my medical decision making (see chart for details).  Clinical Course as of May 12 1329  Wed May 12, 2018  1314 Patient is having atypical chest pain which is actually consistent with costochondritis given her dry cough for the last several weeks. Troponin negative, chest xray negative and ekg unremarkable. She also has a HEART pathway score of 3. She is currently not having any  chest pain. I do not think this is a PE given that she is not SOB, hypoxic, tachypnic, tachycardic. She has no hx of blood clot, has not been recently immobilized, no malignancy or hemoptysis and is not on  any hormone therapy. No signs of DVT either. She is stable for discharge. I discussed in length the return precautions. She has an established PCP and should f/u with them for further workup. She is happy and in agreement with the plan. Given her cough/post nasal drip, I will give her some Bromfed to see if this helps in alleviating the cough   [KM]    Clinical Course User Index [KM] Alveria Apley, PA-C    Based on review of vitals, medical screening exam, lab work and/or imaging, there does not appear to be an acute, emergent etiology for the patient's symptoms. Counseled pt on good return precautions and encouraged both PCP and ED follow-up as needed.   Clinical Impression: 1. Atypical chest pain     Disposition: Discharge    Final Clinical Impressions(s) / ED Diagnoses   Final diagnoses:  Atypical chest pain    ED Discharge Orders         Ordered    brompheniramine-pseudoephedrine-DM 30-2-10 MG/5ML syrup  4 times daily PRN     05/12/18 1323           Alveria Apley, PA-C 05/12/18 1327    Alveria Apley, PA-C 05/12/18 1331    Duffy Bruce, MD 05/12/18 1924

## 2018-05-12 NOTE — ED Triage Notes (Signed)
Pt reports left sided chest pain that started on Saturday worse with a productive cough she has had for the last few days.

## 2018-05-12 NOTE — ED Notes (Signed)
Patient verbalizes understanding of discharge instructions. Opportunity for questioning and answers were provided. Armband removed by staff, pt discharged from ED. Prescriptions and follow up care reviewed.

## 2018-05-14 ENCOUNTER — Encounter: Payer: Self-pay | Admitting: Internal Medicine

## 2018-05-14 DIAGNOSIS — Z86010 Personal history of colonic polyps: Secondary | ICD-10-CM

## 2018-05-14 NOTE — Progress Notes (Signed)
Small adenoma Recall 2025

## 2018-05-20 ENCOUNTER — Encounter: Payer: 59 | Admitting: Family Medicine

## 2018-06-06 ENCOUNTER — Other Ambulatory Visit: Payer: Self-pay | Admitting: Family Medicine

## 2018-06-16 DIAGNOSIS — E119 Type 2 diabetes mellitus without complications: Secondary | ICD-10-CM | POA: Diagnosis not present

## 2018-06-16 DIAGNOSIS — Z7984 Long term (current) use of oral hypoglycemic drugs: Secondary | ICD-10-CM | POA: Diagnosis not present

## 2018-06-16 DIAGNOSIS — Z961 Presence of intraocular lens: Secondary | ICD-10-CM | POA: Diagnosis not present

## 2018-07-07 ENCOUNTER — Other Ambulatory Visit: Payer: Self-pay | Admitting: Family Medicine

## 2018-07-08 ENCOUNTER — Telehealth: Payer: Self-pay | Admitting: Family Medicine

## 2018-07-08 NOTE — Telephone Encounter (Signed)
Copied from [place] [id number]. Topic: Quick Communication - Rx Refill/Question >> Jul 08, 2018  4:38 PM Percell Belt A wrote: Medication: losartan-hydrochlorothiazide (HYZAAR) 50-12.5 MG tablet [[id number]]- pt called in and left VM, state this med is on back order for 2 months .  She would is completely out and would like to know what she needs to do?    Has the patient contacted their pharmacy? Yes.   (Agent: If no, request that the patient contact the pharmacy for the refill.) (Agent: If yes, when and what did the pharmacy advise?) [facility] [place], [place] [phone] (Phone)  Preferred Pharmacy (with phone number or street name):   Agent: Please be advised that RX refills may take up to 3 business days. We ask that you follow-up with your pharmacy.

## 2018-07-09 MED ORDER — VALSARTAN 80 MG PO TABS: 80.0000 mg | ORAL_TABLET | Freq: Every day | ORAL | 5 refills | Status: DC

## 2018-07-09 NOTE — Telephone Encounter (Signed)
Spoke with patient- aware of recommendations.  Medication sent to pharmacy as requested.  Pt to check BP routinely and let us know if any changes Pt scheduled for Webex appt on 08/09/18 at 11am with Dr Ethlyn Gallery  Pt emailed all the information for Webex app and appt.   Nothing further needed.

## 2018-07-09 NOTE — Telephone Encounter (Signed)
Unfortunately the losartan alone is also on back order.   I don't see that she has tried other similar meds in the past? But let me know if she has tried others that she did not do well with.   I would suggest we convert from the losartan-hctz TO Valsartan 80mg  daily. I think we can leave off the hctz component for now as this is low dose.   If she has cuff at home it would be great for her to monitor bp with this transition and schedule a follow up web visit in a month to review home readings? If she does not have a cuff I think reasonable to recommend purchasing for monitoring.   I would suggest 30 day supply in case any issues with med and then 5 refill.   Med is in same category as losartan so hopefully will be a smooth transition, but let us know if concerns!

## 2018-07-09 NOTE — Telephone Encounter (Signed)
I left a message for the pt to return my call. 

## 2018-07-12 ENCOUNTER — Encounter: Payer: 59 | Admitting: Family Medicine

## 2018-07-21 ENCOUNTER — Ambulatory Visit: Payer: Self-pay | Admitting: Family Medicine

## 2018-07-21 ENCOUNTER — Other Ambulatory Visit: Payer: Self-pay

## 2018-07-21 ENCOUNTER — Ambulatory Visit (INDEPENDENT_AMBULATORY_CARE_PROVIDER_SITE_OTHER): Payer: Self-pay | Admitting: Family Medicine

## 2018-07-21 DIAGNOSIS — I10 Essential (primary) hypertension: Secondary | ICD-10-CM

## 2018-07-21 MED ORDER — VALSARTAN-HYDROCHLOROTHIAZIDE 160-12.5 MG PO TABS: 1.0000 | ORAL_TABLET | Freq: Every day | ORAL | 3 refills | Status: DC

## 2018-07-21 NOTE — Telephone Encounter (Signed)
Pt c/o higher than normal BP since Monday. Pt has been monitoring her BP several times per day and her BP's range 354-562 (systolic) and 56-38 diastolic. Pt stated that she is not symptomatic now. Upon chart review, pt's systolic BP runs in the 937'D range. Pt stated she awoke SOB but that it has since subsided.  Pt stated she had a headache yesterday but none today.  Pt stated she does not think the valsartan is controlling her BP as well as losartan-HCTZ. Pt stated the losartan-HCTZ is on backorder. Care advice given and pt verbalized understanding.  Call transferred to [facility] at [facility] to schedule appointment. E-mail verified.          Reason for Disposition . Systolic BP  >= 428 OR Diastolic >= 768  Answer Assessment - Initial Assessment Questions 1. BLOOD PRESSURE: "What is the blood pressure?" "Did you take at least two measurements 5 minutes apart?"     177/78 170/75 168/77   199/87  174/84 2. ONSET: "When did you take your blood pressure?"     Several times a  Day since Monday 3. HOW: "How did you obtain the blood pressure?" (e.g., visiting nurse, automatic home BP monitor)     Wrist BP machine 4. HISTORY: "Do you have a history of high blood pressure?"     yes 5. MEDICATIONS: "Are you taking any medications for blood pressure?" "Have you missed any doses recently?"     Yes- no 6. OTHER SYMPTOMS: "Do you have any symptoms?" (e.g., headache, chest pain, blurred vision, difficulty breathing, weakness)     Had SOB when he first awoke and now symptoms are gone 7. PREGNANCY: "Is there any chance you are pregnant?" "When was your last menstrual period?"     n/a  Protocols used: HIGH BLOOD PRESSURE-A-AH

## 2018-07-21 NOTE — Telephone Encounter (Signed)
Pt seen by virtual visit and change in BP meds made.

## 2018-07-21 NOTE — Telephone Encounter (Signed)
You have already seen this patient. I just received this message.

## 2018-07-21 NOTE — Telephone Encounter (Signed)
Appointment made with Dr. Elease Hashimoto.

## 2018-07-21 NOTE — Progress Notes (Signed)
Patient ID: Andrea [name], female   DOB: 03/24/1956, 63 y.o.   MRN: [id number]  Virtual Visit via Video Note  I connected with Andrea [name] on 07/21/18 at  2:15 PM EDT by a video enabled telemedicine application and verified that I am speaking with the correct person using two identifiers.  Location patient: home Location provider:work or home office Persons participating in the virtual visit: patient, provider  I discussed the limitations of evaluation and management by telemedicine and the availability of in person appointments. The patient expressed understanding and agreed to proceed.   HPI: Patient has history of hypertension, CAD, type 2 diabetes, hyperlipidemia.  She had called recently with concerns of elevated blood pressures.  She had been taking losartan HCTZ There was a supply issue and her losartan HCTZ was changed to plain valsartan 80 mg on 27 March.  Since that time she has had several elevated blood pressure readings and she gives as examples 170/75, 162/66, and 168/77.  She denies any headaches.  No chest pains.  No peripheral edema.  Compliant with therapy.   ROS: See pertinent positives and negatives per HPI.  Past Medical History:  Diagnosis Date  . Allergy   . Anemia   . Arthritis   . CAD (coronary artery disease)    has 3 stents  . Cataract    removed both eyes   . Diabetes mellitus   . Hx of adenomatous colonic polyps 08/09/2014  . Hyperlipidemia   . Hypertension   . Low back pain     Past Surgical History:  Procedure Laterality Date  . BREAST LUMPECTOMY    . carpal tunnel release both hands     . CESAREAN SECTION     2 times  . COLONOSCOPY     x 2- hx colon polyps-   . EYE SURGERY     lens replaced/02/06/15 and 03/09/15  . knee surgery for torn cartillige     right knee  . LASIK  01/12/2014, 03/14/2014  . POLYPECTOMY    . rotator cuff surgery     right side  . stents  2000  . stents 2001  2001   3 stents  . triger finger repair     both  hands, right hand done twice  . TUBAL LIGATION      Family History  Problem Relation Age of Onset  . Heart disease Mother   . Hypertension Mother   . Heart disease Father   . Heart disease Sister   . Pancreatic cancer Paternal Uncle   . Colitis Neg Hx   . Colon cancer Neg Hx   . Esophageal cancer Neg Hx   . Rectal cancer Neg Hx   . Stomach cancer Neg Hx     SOCIAL HX: Non-smoker   Current Outpatient Medications:  .  aspirin 81 MG tablet, Take 81 mg by mouth daily.  , Disp: , Rfl:  .  brompheniramine-pseudoephedrine-DM 30-2-10 MG/5ML syrup, Take 5 mLs by mouth 4 (four) times daily as needed., Disp: 120 mL, Rfl: 0 .  glimepiride (AMARYL) 4 MG tablet, TAKE 1 TABLET(4 MG) BY MOUTH DAILY BEFORE BREAKFAST (Patient taking differently: Take 4 mg by mouth daily with breakfast. TAKE 1 TABLET(4 MG) BY MOUTH DAILY BEFORE BREAKFAST), Disp: 30 tablet, Rfl: 5 .  glucose blood (ONE TOUCH ULTRA TEST) test strip, USE TO CHECK BLOOD SUGAR DAILY AND AS NEEDED, Disp: 100 each, Rfl: 5 .  JANUVIA 100 MG tablet, TAKE 1 TABLET(100 MG) BY MOUTH  DAILY, Disp: 90 tablet, Rfl: 0 .  metFORMIN (GLUCOPHAGE) 1000 MG tablet, TAKE 1 TABLET BY MOUTH TWICE DAILY AT 4AM AND 6PM ON WORK DAYS AND AT 7 TO 8 AM AND 6PM ON NON-WORK DAYS (Patient taking differently: Take 1,000 mg by mouth 2 (two) times daily with a meal. ), Disp: 60 tablet, Rfl: 5 .  metoprolol tartrate (LOPRESSOR) 50 MG tablet, TAKE 1/2 TABLET BY MOUTH TWICE DAILY, Disp: 90 tablet, Rfl: 0 .  ONETOUCH DELICA LANCETS FINE MISC, 1 Stick by Does not apply route daily., Disp: 100 each, Rfl: 3 .  rosuvastatin (CRESTOR) 10 MG tablet, TAKE 1 TABLET(10 MG) BY MOUTH DAILY, Disp: 90 tablet, Rfl: 1 .  vitamin B-12 (CYANOCOBALAMIN) 1000 MCG tablet, Take 1,000 mcg by mouth daily., Disp: , Rfl:   EXAM:  VITALS per patient if applicable:  GENERAL: alert, oriented, appears well and in no acute distress  HEENT: atraumatic, conjunttiva clear, no obvious abnormalities on  inspection of external nose and ears  NECK: normal movements of the head and neck  LUNGS: on inspection no signs of respiratory distress, breathing rate appears normal, no obvious gross SOB, gasping or wheezing  CV: no obvious cyanosis  MS: moves all visible extremities without noticeable abnormality  PSYCH/NEURO: pleasant and cooperative, no obvious depression or anxiety, speech and thought processing grossly intact  ASSESSMENT AND PLAN:  Discussed the following assessment and plan:  Hypertension.  Poorly controlled by several home readings.  She apparently was doing better with combination therapy of losartan and HCTZ -Discontinue valsartan 80 mg daily -Start valsartan HCTZ 160/12.5 mg 1 daily -Follow-up with primary by video visit in about 3 weeks.  In the meantime, she will continue to monitor closely at home     I discussed the assessment and treatment plan with the patient. The patient was provided an opportunity to ask questions and all were answered. The patient agreed with the plan and demonstrated an understanding of the instructions.   The patient was advised to call back or seek an in-person evaluation if the symptoms worsen or if the condition fails to improve as anticipated   Carolann Littler, MD

## 2018-08-03 ENCOUNTER — Other Ambulatory Visit: Payer: Self-pay | Admitting: Family Medicine

## 2018-08-09 ENCOUNTER — Ambulatory Visit: Payer: Self-pay | Admitting: Family Medicine

## 2018-09-04 ENCOUNTER — Other Ambulatory Visit: Payer: Self-pay | Admitting: Family Medicine

## 2018-09-07 ENCOUNTER — Other Ambulatory Visit: Payer: Self-pay | Admitting: Family Medicine

## 2018-09-09 ENCOUNTER — Telehealth: Payer: Self-pay | Admitting: *Deleted

## 2018-09-09 ENCOUNTER — Ambulatory Visit (INDEPENDENT_AMBULATORY_CARE_PROVIDER_SITE_OTHER): Payer: Self-pay | Admitting: Family Medicine

## 2018-09-09 ENCOUNTER — Encounter: Payer: Self-pay | Admitting: Family Medicine

## 2018-09-09 ENCOUNTER — Other Ambulatory Visit: Payer: Self-pay

## 2018-09-09 ENCOUNTER — Other Ambulatory Visit: Payer: Self-pay | Admitting: Family Medicine

## 2018-09-09 VITALS — BP 138/62

## 2018-09-09 DIAGNOSIS — I152 Hypertension secondary to endocrine disorders: Secondary | ICD-10-CM

## 2018-09-09 DIAGNOSIS — E785 Hyperlipidemia, unspecified: Secondary | ICD-10-CM

## 2018-09-09 DIAGNOSIS — E538 Deficiency of other specified B group vitamins: Secondary | ICD-10-CM

## 2018-09-09 DIAGNOSIS — E1169 Type 2 diabetes mellitus with other specified complication: Secondary | ICD-10-CM

## 2018-09-09 DIAGNOSIS — I10 Essential (primary) hypertension: Secondary | ICD-10-CM

## 2018-09-09 DIAGNOSIS — I2510 Atherosclerotic heart disease of native coronary artery without angina pectoris: Secondary | ICD-10-CM

## 2018-09-09 DIAGNOSIS — E1140 Type 2 diabetes mellitus with diabetic neuropathy, unspecified: Secondary | ICD-10-CM

## 2018-09-09 DIAGNOSIS — E1159 Type 2 diabetes mellitus with other circulatory complications: Secondary | ICD-10-CM

## 2018-09-09 HISTORY — DX: Deficiency of other specified B group vitamins: E53.8

## 2018-09-09 NOTE — Telephone Encounter (Signed)
-----   Message from Lucretia Kern, DO sent at 09/09/2018 10:58 AM EDT ----- -follow up with Dr. Maudie Mercury in 3-4 months -welcome to medicare with Dr. Ethlyn Gallery in next 2 weeks if opening available -lab visit the same day as AWV -pneumococcal the same day as AWV

## 2018-09-09 NOTE — Progress Notes (Signed)
Virtual Visit via Video Note  I connected with Andrea [name]  on 09/09/18 at 10:40 AM EDT by a video enabled telemedicine application and verified that I am speaking with the correct person using two identifiers.  Location patient: home Location provider:work or home office Persons participating in the virtual visit: patient, provider  I discussed the limitations of evaluation and management by telemedicine and the availability of in person appointments. The patient expressed understanding and agreed to proceed.   HPI:  Andrea [name] is a pleasant 63 y.o. seen for follow up. Did not follow up as advised. She did see one of my colleagues a few weeks ago for elevated BP after changing to valsartan due to a supply issue with her losartan. Back on a combo with losartan now.  Other chronic medical problems summarized below were reviewed for changes and stability and were updated as needed below. Reports BS have been ok. Very rarely low, but usually 80-150s. Walking when the weather is good. Denies CP, SOB, DOE, treatment intolerance or new symptoms.  HTN: -meds: losartan - hctz, metoprolol  B12 def: -on oral supplementation  HLD/CAD: -meds: crestor, asa, BB, Arb -hx PCI, 3 stents remotely  DM: -meds: asa, amaryl, januvia, metformin -sees Dr. Corrin Parker for eye care   ROS: See pertinent positives and negatives per HPI.  Past Medical History:  Diagnosis Date  . Allergy   . Anemia   . Arthritis   . CAD (coronary artery disease)    has 3 stents  . Cataract    removed both eyes   . Diabetes mellitus   . Hx of adenomatous colonic polyps 08/09/2014  . Hyperlipidemia   . Hypertension   . Low back pain     Past Surgical History:  Procedure Laterality Date  . BREAST LUMPECTOMY    . carpal tunnel release both hands     . CESAREAN SECTION     2 times  . COLONOSCOPY     x 2- hx colon polyps-   . EYE SURGERY     lens replaced/02/06/15 and 03/09/15  . knee surgery for torn cartillige      right knee  . LASIK  01/12/2014, 03/14/2014  . POLYPECTOMY    . rotator cuff surgery     right side  . stents  2000  . stents 2001  2001   3 stents  . triger finger repair     both hands, right hand done twice  . TUBAL LIGATION      Family History  Problem Relation Age of Onset  . Heart disease Mother   . Hypertension Mother   . Heart disease Father   . Heart disease Sister   . Pancreatic cancer Paternal Uncle   . Colitis Neg Hx   . Colon cancer Neg Hx   . Esophageal cancer Neg Hx   . Rectal cancer Neg Hx   . Stomach cancer Neg Hx     SOCIAL HX: see hpi   Current Outpatient Medications:  .  aspirin 81 MG tablet, Take 81 mg by mouth daily.  , Disp: , Rfl:  .  glimepiride (AMARYL) 4 MG tablet, TAKE 1 TABLET(4 MG) BY MOUTH DAILY BEFORE BREAKFAST (Patient taking differently: Take 4 mg by mouth daily with breakfast. TAKE 1 TABLET(4 MG) BY MOUTH DAILY BEFORE BREAKFAST), Disp: 30 tablet, Rfl: 5 .  glucose blood (ONE TOUCH ULTRA TEST) test strip, USE TO CHECK BLOOD SUGAR DAILY AND AS NEEDED, Disp: 100 each, Rfl: 5 .  JANUVIA 100 MG tablet, TAKE 1 TABLET(100 MG) BY MOUTH DAILY, Disp: 90 tablet, Rfl: 0 .  metFORMIN (GLUCOPHAGE) 1000 MG tablet, TAKE 1 TABLET BY MOUTH TWICE DAILY AT 4AM AND 6PM ON WORK DAYS AND AT 7 TO 8 AM AND 6PM ON NON-WORK DAYS, Disp: 60 tablet, Rfl: 3 .  metoprolol tartrate (LOPRESSOR) 50 MG tablet, TAKE 1/2 TABLET BY MOUTH TWICE DAILY, Disp: 90 tablet, Rfl: 0 .  ONETOUCH DELICA LANCETS FINE MISC, 1 Stick by Does not apply route daily., Disp: 100 each, Rfl: 3 .  rosuvastatin (CRESTOR) 10 MG tablet, TAKE 1 TABLET(10 MG) BY MOUTH DAILY, Disp: 90 tablet, Rfl: 1 .  valsartan-hydrochlorothiazide (DIOVAN HCT) 160-12.5 MG tablet, Take 1 tablet by mouth daily., Disp: 90 tablet, Rfl: 3 .  vitamin B-12 (CYANOCOBALAMIN) 1000 MCG tablet, Take 1,000 mcg by mouth daily., Disp: , Rfl:   EXAM:  VITALS per patient if applicable: Vitals:   37/10/62 1031  BP: 138/62      GENERAL: alert, oriented, appears well and in no acute distress  HEENT: atraumatic, conjunttiva clear, no obvious abnormalities on inspection of external nose and ears  NECK: normal movements of the head and neck  LUNGS: on inspection no signs of respiratory distress, breathing rate appears normal, no obvious gross SOB, gasping or wheezing  CV: no obvious cyanosis  MS: moves all visible extremities without noticeable abnormality  PSYCH/NEURO: pleasant and cooperative, no obvious depression or anxiety, speech and thought processing grossly intact  ASSESSMENT AND PLAN:  Discussed the following assessment and plan:  Type 2 diabetes mellitus with diabetic neuropathy, without long-term current use of insulin (HCC) -check labs -cont current meds and continue home monitoring  Hypertension associated with diabetes ([place]) -seems to be doing much better, diastolic a little low, opted to cont current regimen -labs per orders  Hyperlipidemia associated with type 2 diabetes mellitus ([place]) -labs per orders, continue statin  Atherosclerosis of native coronary artery of native heart without angina pectoris -continue current meds, exercise, healthy diet  Due for Welcome to medicare exam. She is agreeable to doing this in office with my partner Dr. Ethlyn Gallery. Ordered her labs and she will do those and her pneumococcal vaccine that day as well. Follow up with me in 3-4 months.    I discussed the assessment and treatment plan with the patient. The patient was provided an opportunity to ask questions and all were answered. The patient agreed with the plan and demonstrated an understanding of the instructions.   The patient was advised to call back or seek an in-person evaluation if the symptoms worsen or if the condition fails to improve as anticipated.   Follow up instructions: Advised assistant [name] to help patient arrange the following: -follow up with Dr. Maudie Mercury in 3-4  months -welcome to medicare with Dr. Ethlyn Gallery in next 2 weeks if opening available -lab visit the same day as AWV -pneumococcal the same day as [place], DO

## 2018-09-09 NOTE — Telephone Encounter (Signed)
I left a message for the pt to return my call to schedule appts as below.

## 2018-09-17 ENCOUNTER — Other Ambulatory Visit (INDEPENDENT_AMBULATORY_CARE_PROVIDER_SITE_OTHER): Payer: Medicare Other

## 2018-09-17 ENCOUNTER — Other Ambulatory Visit: Payer: Self-pay

## 2018-09-17 DIAGNOSIS — I152 Hypertension secondary to endocrine disorders: Secondary | ICD-10-CM

## 2018-09-17 DIAGNOSIS — E785 Hyperlipidemia, unspecified: Secondary | ICD-10-CM

## 2018-09-17 DIAGNOSIS — E1140 Type 2 diabetes mellitus with diabetic neuropathy, unspecified: Secondary | ICD-10-CM

## 2018-09-17 DIAGNOSIS — E1169 Type 2 diabetes mellitus with other specified complication: Secondary | ICD-10-CM | POA: Diagnosis not present

## 2018-09-17 DIAGNOSIS — I10 Essential (primary) hypertension: Secondary | ICD-10-CM

## 2018-09-17 DIAGNOSIS — E1159 Type 2 diabetes mellitus with other circulatory complications: Secondary | ICD-10-CM | POA: Diagnosis not present

## 2018-09-17 LAB — CBC
HCT: 39.1 % (ref 36.0–46.0)
Hemoglobin: 13.8 g/dL (ref 12.0–15.0)
MCHC: 35.3 g/dL (ref 30.0–36.0)
MCV: 94.2 fl (ref 78.0–100.0)
Platelets: 281 10*3/uL (ref 150.0–400.0)
RBC: 4.15 Mil/uL (ref 3.87–5.11)
RDW: 13.9 % (ref 11.5–15.5)
WBC: 6.7 10*3/uL (ref 4.0–10.5)

## 2018-09-17 LAB — BASIC METABOLIC PANEL
BUN: 16 mg/dL (ref 6–23)
CO2: 27 mEq/L (ref 19–32)
Calcium: 9.6 mg/dL (ref 8.4–10.5)
Chloride: 102 mEq/L (ref 96–112)
Creatinine, Ser: 0.79 mg/dL (ref 0.40–1.20)
GFR: 72.99 mL/min (ref >60.00)
Glucose, Bld: 138 mg/dL — ABNORMAL HIGH (ref 70–99)
Potassium: 4.3 mEq/L (ref 3.5–5.1)
Sodium: 140 mEq/L (ref 135–145)

## 2018-09-17 LAB — LIPID PANEL
Cholesterol: 111 mg/dL (ref 0–200)
HDL: 30.6 mg/dL — ABNORMAL LOW (ref >39.00)
LDL Cholesterol: 43 mg/dL (ref 0–99)
NonHDL: 80.46
Total CHOL/HDL Ratio: 4
Triglycerides: 185 mg/dL — ABNORMAL HIGH (ref 0.0–149.0)
VLDL: 37 mg/dL (ref 0.0–40.0)

## 2018-09-17 LAB — HEMOGLOBIN A1C: Hgb A1c MFr Bld: 7.3 % — ABNORMAL HIGH (ref 4.6–6.5)

## 2018-09-27 ENCOUNTER — Telehealth: Payer: Self-pay | Admitting: *Deleted

## 2018-09-27 NOTE — Telephone Encounter (Signed)
Copied from [place] [id number]. Topic: General - Other >> Sep 23, 2018  2:24 PM Rutherford Nail, NT wrote: Reason for CRM: Patient returning call to [name]. Please advise.

## 2018-09-27 NOTE — Telephone Encounter (Signed)
See results note. 

## 2018-09-29 ENCOUNTER — Other Ambulatory Visit: Payer: Self-pay | Admitting: Family Medicine

## 2018-09-29 MED ORDER — GLIMEPIRIDE 4 MG PO TABS: 4.0000 mg | ORAL_TABLET | Freq: Every day | ORAL | 1 refills | Status: DC

## 2018-09-29 NOTE — Telephone Encounter (Signed)
Medication Refill - Medication: Glimepride 4mg   Has the patient contacted their pharmacy?  Yes advised to contact office. Patient states she would like all prescriptions sent to [facility] from now on.   Preferred Pharmacy (with phone number or street name): [facility]. Phone [phone]. Fax [phone]  Agent: Please be advised that RX refills may take up to 3 business days. We ask that you follow-up with your pharmacy.

## 2018-09-29 NOTE — Telephone Encounter (Signed)
Rx done. 

## 2018-10-28 ENCOUNTER — Telehealth: Payer: Self-pay | Admitting: *Deleted

## 2018-10-28 ENCOUNTER — Other Ambulatory Visit: Payer: Self-pay

## 2018-10-28 ENCOUNTER — Ambulatory Visit (INDEPENDENT_AMBULATORY_CARE_PROVIDER_SITE_OTHER): Payer: Medicare Other | Admitting: Family Medicine

## 2018-10-28 ENCOUNTER — Encounter: Payer: Self-pay | Admitting: Family Medicine

## 2018-10-28 DIAGNOSIS — E1140 Type 2 diabetes mellitus with diabetic neuropathy, unspecified: Secondary | ICD-10-CM

## 2018-10-28 NOTE — Telephone Encounter (Signed)
I called the pt and scheduled appts as below.

## 2018-10-28 NOTE — Progress Notes (Signed)
Virtual Visit via Telephone Note  I connected with Andrea [name] on 10/28/18 at 10:00 AM EDT by telephone and verified that I am speaking with the correct person using two identifiers.   I discussed the limitations, risks, security and privacy concerns of performing an evaluation and management service by telephone and the availability of in person appointments. I also discussed with the patient that there may be a patient responsible charge related to this service. The patient expressed understanding and agreed to proceed.  Location patient: home Location provider: work or home office Participants present for the call: patient, provider Patient did not have a visit in the prior 7 days to address this/these issue(s).   History of Present Illness:   Phone visit for diabetes follow up. Recent HgbA1c 7.3 - up from 6.7. Current medications include Amaryl, metformin and Januvia. Current home BSs - fasting they have been 103-150s prior to labs. Since labs 130s on average. Changes to lifestyle since lab results include has been walking " a little bit". Has changed from sweet tea to half and half, has changed to splenda. She has been eating a lot of watermelon. Scheduled for AWV in office with Dr. Ethlyn Gallery and will get pneumococcal vaccine then.    Observations/Objective: Patient sounds cheerful and well on the phone. I do not appreciate any SOB. Speech and thought processing are grossly intact. Patient reported vitals:  Assessment and Plan:  Diabetes: -discussed implications, treatment options, risks of uncontrolled diabetes at length -reviewed diet and exercise habits at length and pt identified several areas she feels she could improve and wishes to try that prior to a change in medication -she plans to decrease sweets further, eliminate watermelon and increase exercise  Follow Up Instructions:  -labs in 2-3 months with follow up with Dr. Maudie Mercury after    I did not refer this patient  for an OV in the next 24 hours for this/these issue(s).  I discussed the assessment and treatment plan with the patient. The patient was provided an opportunity to ask questions and all were answered. The patient agreed with the plan and demonstrated an understanding of the instructions.   The patient was advised to call back or seek an in-person evaluation if the symptoms worsen or if the condition fails to improve as anticipated.  I provided 11 minutes of non-face-to-face time during this encounter.   Lucretia Kern, DO

## 2018-10-28 NOTE — Telephone Encounter (Signed)
-----   Message from Lucretia Kern, DO sent at 10/28/2018 10:20 AM EDT ----- -lab appt in 2-3 months-follow up with Dr. Maudie Mercury by phone or video a few days/1 week after labs

## 2018-10-28 NOTE — Patient Instructions (Signed)
-  cut down on sweet tea - consider stevia as a replacement sweetener in tea if needed  -cut down on sugary fruits such as watermelon  -eat lots of healthy vegetable and lean proteins. It small amounts of whole grains - no white grains.  -get regular aerobic exercise - at least 150 minutes per week or 30 minutes on most days  -lab check in 2-3 months with follow up with Dr. Maudie Mercury a few days later by phone or video

## 2018-11-29 ENCOUNTER — Other Ambulatory Visit: Payer: Self-pay | Admitting: Family Medicine

## 2018-11-29 NOTE — Progress Notes (Signed)
Pt never had B12 drawn, but had appt with PCP after these labs were ordered.

## 2018-12-01 ENCOUNTER — Ambulatory Visit (INDEPENDENT_AMBULATORY_CARE_PROVIDER_SITE_OTHER): Payer: Medicare Other | Admitting: Family Medicine

## 2018-12-01 ENCOUNTER — Encounter: Payer: Self-pay | Admitting: Family Medicine

## 2018-12-01 VITALS — BP 136/64 | HR 66 | Temp 98.3°F | Wt 149.1 lb

## 2018-12-01 DIAGNOSIS — Z1239 Encounter for other screening for malignant neoplasm of breast: Secondary | ICD-10-CM

## 2018-12-01 DIAGNOSIS — E1140 Type 2 diabetes mellitus with diabetic neuropathy, unspecified: Secondary | ICD-10-CM

## 2018-12-01 DIAGNOSIS — E2839 Other primary ovarian failure: Secondary | ICD-10-CM

## 2018-12-01 DIAGNOSIS — Z0000 Encounter for general adult medical examination without abnormal findings: Secondary | ICD-10-CM | POA: Diagnosis not present

## 2018-12-01 DIAGNOSIS — Z23 Encounter for immunization: Secondary | ICD-10-CM | POA: Diagnosis not present

## 2018-12-01 NOTE — Patient Instructions (Addendum)
Consider anti-histamine (without "D")- claritin or zyrtec. Can ask Dr. Gershon Crane about flonase nasal spray because this can increase eye pressure.    For [facility] help with Januvia: Program Contact Information-[phone]

## 2018-12-01 NOTE — Progress Notes (Signed)
Patient: Andrea [name], Female    DOB: [birth year], 63 y.o.   MRN: [id number] Visit Date: 12/01/2018  Today's Provider: [name], MD   Chief Complaint  Patient presents with  . Medicare Wellness    Pt has no concerns    Subjective:   Initial preventative physical exam  Andrea [name]  is a 63 y.o. female who presents today for her Initial Preventative Physical Exam. She feels well. She reports exercising 5 days per week. She reports she is sleeping well. She does work 3rd shift currently.  HPI Living arrangements - the patient  Lives with son, daughter in law, grandchildren (1 permanent and 1 just on weekends) - 65 year old and 65 year old.  Hearing screen:completed today Vision screen:completed today Advance Directives/Living Will:no, but interested in getting these done. Tries to talk with children, but they don't like talking about it. She does have ideas of what she would like (no life support), but doesn't know all details.  Blood sugars running about 140-150 in morning; but drops in afternoon sometimes. Other day dropped to 50 and felt shaky. Not happening every month. States that this is a rare occurrence.   Had some bad drainage about a month ago - was coughing, thick sinus drainage - made her throw up. Had cheek and nose pain with this, but feels much better today. No current cough. Denies sob.   Doesn't want lung cancer screening.   Review of Systems  Constitutional: Negative for activity change, appetite change, chills, fatigue, fever and unexpected weight change.  HENT: Negative for congestion, ear pain, hearing loss, sinus pressure, sinus pain, sore throat and trouble swallowing.   Eyes: Negative for pain and visual disturbance.  Respiratory: Negative for cough, chest tightness, shortness of breath and wheezing.   Cardiovascular: Negative for chest pain, palpitations and leg swelling.  Gastrointestinal: Negative for abdominal pain, blood in stool, constipation,  diarrhea, nausea and vomiting.  Genitourinary: Negative for difficulty urinating and menstrual problem.  Musculoskeletal: Positive for arthralgias (knees, right especially). Negative for back pain.  Skin: Negative for rash.  Neurological: Negative for dizziness, weakness, light-headedness, numbness and headaches.       Occasional leaning with walking  Hematological: Negative for adenopathy. Does not bruise/bleed easily.  Psychiatric/Behavioral: Negative for sleep disturbance and suicidal ideas. The patient is not nervous/anxious.     Social History   Socioeconomic History  . Marital status: Divorced    Spouse name: Not on file  . Number of children: Not on file  . Years of education: Not on file  . Highest education level: Not on file  Occupational History  . Not on file  Social Needs  . Financial resource strain: Not on file  . Food insecurity    Worry: Not on file    Inability: Not on file  . Transportation needs    Medical: Not on file    Non-medical: Not on file  Tobacco Use  . Smoking status: Former Smoker    Types: E-cigarettes    Quit date: 06/20/2010    Years since quitting: 8.4  . Smokeless tobacco: Never Used  . Tobacco comment: smoked 1ppd for> 30 years/smokes occasionally,some vapor cigarettes  Substance and Sexual Activity  . Alcohol use: No    Alcohol/week: 0.0 standard drinks  . Drug use: No  . Sexual activity: Yes  Lifestyle  . Physical activity    Days per week: Not on file    Minutes per session: Not on file  .  Stress: Not on file  Relationships  . Social Herbalist on phone: Not on file    Gets together: Not on file    Attends religious service: Not on file    Active member of club or organization: Not on file    Attends meetings of clubs or organizations: Not on file    Relationship status: Not on file  . Intimate partner violence    Fear of current or ex partner: Not on file    Emotionally abused: Not on file    Physically abused:  Not on file    Forced sexual activity: Not on file  Other Topics Concern  . 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 Hyperlipidemia associated with type 2 diabetes mellitus ([place]) 09/09/2018  . B12 deficiency 09/09/2018  . Type 2 diabetes mellitus with diabetic neuropathy, without long-term current use of insulin ([place]) 05/24/2015  . Hx of adenomatous colonic polyps 08/09/2014  . OSTEOARTHRITIS 05/07/2007  . Hypertension associated with diabetes ([place]) 11/19/2006  . Coronary atherosclerosis 11/09/2006    Past Surgical History:  Procedure Laterality Date  . BREAST LUMPECTOMY    . carpal tunnel release both hands     . CESAREAN SECTION     2 times  . COLONOSCOPY     x 2- hx colon polyps-   . EYE SURGERY     lens replaced/02/06/15 and 03/09/15  . knee surgery for torn cartillige     right knee  . LASIK  01/12/2014, 03/14/2014  . POLYPECTOMY    . rotator cuff surgery     right side  . stents  2000  . stents 2001  2001   3 stents  . triger finger repair     both hands, right hand done twice  . TUBAL LIGATION      Her family history includes CAD in her brother; Heart disease in her father, mother, and sister; Hypertension in her mother; Pancreatic cancer in her paternal uncle.     Previous Medications   ASPIRIN 81 MG TABLET    Take 81 mg by mouth daily.     GLUCOSE BLOOD (ONE TOUCH ULTRA TEST) TEST STRIP    USE TO CHECK BLOOD SUGAR DAILY AND AS NEEDED   JANUVIA 100 MG TABLET    TAKE 1 TABLET(100 MG) BY MOUTH DAILY   METFORMIN (GLUCOPHAGE) 1000 MG TABLET    TAKE 1 TABLET BY MOUTH TWICE DAILY AT 4AM AND 6PM ON WORK DAYS AND AT 7 TO 8 AM AND 6PM ON NON-WORK DAYS   METOPROLOL TARTRATE (LOPRESSOR) 50 MG TABLET    TAKE 1/2 TABLET BY MOUTH TWICE DAILY    ONETOUCH DELICA LANCETS FINE MISC    1 Stick by Does not apply route daily.   ROSUVASTATIN (CRESTOR) 10 MG TABLET    TAKE 1 TABLET(10 MG) BY MOUTH DAILY   VALSARTAN-HYDROCHLOROTHIAZIDE (DIOVAN HCT) 160-12.5 MG TABLET    Take 1 tablet by mouth daily.   VITAMIN B-12 (CYANOCOBALAMIN) 1000 MCG TABLET    Take 1,000 mcg by mouth daily.    Patient Care Team: Lucretia Kern, DO as PCP - General (Family Medicine) Gershon Crane,  Elta Guadeloupe, MD as Consulting Physician (Ophthalmology)      Objective:   Vitals: BP 136/64 (BP Location: Right Arm, Patient Position: Sitting, Cuff Size: Normal)   Pulse 66   Temp 98.3 F (36.8 C) (Temporal)   Wt 149 lb 1.6 oz (67.6 kg)   SpO2 94%   BMI 26.41 kg/m   Physical Exam Constitutional:      General: She is not in acute distress.    Appearance: She is well-developed.  HENT:     Head: Normocephalic and atraumatic.     Right Ear: External ear normal.     Left Ear: External ear normal.     Mouth/Throat:     Pharynx: No oropharyngeal exudate.  Eyes:     Conjunctiva/sclera: Conjunctivae normal.     Pupils: Pupils are equal, round, and reactive to light.  Neck:     Musculoskeletal: Normal range of motion and neck supple.     Thyroid: No thyromegaly.  Cardiovascular:     Rate and Rhythm: Normal rate and regular rhythm.     Heart sounds: Normal heart sounds. No murmur. No friction rub. No gallop.   Pulmonary:     Effort: Pulmonary effort is normal.     Breath sounds: Normal breath sounds.  Abdominal:     General: Bowel sounds are normal. There is no distension.     Palpations: Abdomen is soft. There is no mass.     Tenderness: There is no abdominal tenderness. There is no guarding.     Hernia: No hernia is present.  Musculoskeletal: Normal range of motion.        General: No tenderness or deformity.  Lymphadenopathy:     Cervical: No cervical adenopathy.  Skin:    General: Skin is warm and dry.     Findings: No rash.  Neurological:     Mental Status: She is  alert and oriented to person, place, and time.     Motor: No pronator drift.     Coordination: Coordination is intact. Romberg sign negative. Rapid alternating movements normal.     Gait: Gait is intact.     Deep Tendon Reflexes: Reflexes normal.     Reflex Scores:      Tricep reflexes are 2+ on the right side and 2+ on the left side.      Bicep reflexes are 2+ on the right side and 2+ on the left side.      Brachioradialis reflexes are 2+ on the right side and 2+ on the left side.      Patellar reflexes are 2+ on the right side and 2+ on the left side. Psychiatric:        Speech: Speech normal.        Behavior: Behavior normal.        Thought Content: Thought content normal.       Hearing Screening   125Hz  250Hz  500Hz  1000Hz  2000Hz  3000Hz  4000Hz  6000Hz  8000Hz   Right ear:   Pass Pass Pass  Pass    Left ear:   Pass Pass Pass  Pass      Visual Acuity Screening   Right eye Left eye Both eyes  Without correction: 20/15 18/15 20/15   With correction:       Activities of Daily Living No flowsheet data found.  Fall Risk Assessment Fall Risk  05/25/2014 10/31/2013  Falls in the past year? No No     Patient reports there are not safety devices in place in shower at home.  Depression Screen PHQ 2/9 Scores 12/01/2018 03/09/2017 12/30/2016 05/25/2014  PHQ - 2 Score 0 0 0 0    Cognitive Testing - 6-CIT   Correct? Score   What year is it? yes 0 Yes = 0    No = 4  What month is it? yes 0 Yes = 0    No = 3  Remember:     [address]ia [address], [place], [place]     What time is it? yes 0 Yes = 0    No = 3  Count backwards from 20 to 1 yes 0 Correct = 0    1 error = 2   More than 1 error = 4  Say the months of the year in reverse. yes 0 Correct = 0    1 error = 2   More than 1 error = 4  What address did I ask you to remember? yes 0 Correct = 0  1 error = 2    2 error = 4    3 error = 6    4 error = 8    All wrong = 10       TOTAL SCORE  0/28   Interpretation:  Normal  Normal (0-7)  Abnormal (8-28)    Health Maintenance  Topic Date Due  . DEXA SCAN  07/01/2018  . PNA vac Low Risk Adult (1 of 2 - PCV13) 07/01/2018  . INFLUENZA VACCINE  11/13/2018  . MAMMOGRAM  03/12/2019 (Originally 04/23/2018)  . PAP SMEAR-Modifier  03/12/2019 (Originally 11/22/2017)  . HIV Screening  08/03/2024 (Originally 06/30/1970)  . FOOT EXAM  02/16/2019  . HEMOGLOBIN A1C  03/19/2019  . OPHTHALMOLOGY EXAM  04/15/2019  . TETANUS/TDAP  04/28/2019  . COLONOSCOPY  04/30/2023  . Hepatitis C Screening  Completed    Assessment & Plan:     Initial Preventative Physical Exam  Reviewed patient's Family Medical History Reviewed and updated list of patient's medical providers Assessment of cognitive impairment was done Assessed patient's functional ability Established a written schedule for health screening [place] Completed and Reviewed  Exercise Activities and Dietary recommendations Goals   None     Immunization History  Administered Date(s) Administered  . Influenza Whole 01/12/1997, 01/05/2009  . Influenza,inj,Quad PF,6+ Mos 01/12/2015, 12/27/2015, 03/09/2017, 12/01/2018  . Influenza-Unspecified 02/12/2014, 01/19/2018  . Pneumococcal Conjugate-13 12/01/2018  . Pneumococcal Polysaccharide-23 04/24/2008, 01/31/2014  . Td 11/13/1997, 04/27/2009  . Zoster Recombinat (Shingrix) 11/12/2017, 01/12/2018    Health Maintenance  Topic Date Due  . DEXA SCAN  07/01/2018  . PNA vac Low Risk Adult (1 of 2 - PCV13) 07/01/2018  . INFLUENZA VACCINE  11/13/2018  . MAMMOGRAM  03/12/2019 (Originally 04/23/2018)  . PAP SMEAR-Modifier  03/12/2019 (Originally 11/22/2017)  . HIV Screening  08/03/2024 (Originally 06/30/1970)  . FOOT EXAM  02/16/2019  . HEMOGLOBIN A1C  03/19/2019  . OPHTHALMOLOGY EXAM  04/15/2019  . TETANUS/TDAP  04/28/2019  . COLONOSCOPY  04/30/2023  . Hepatitis C Screening  Completed     Discussed health benefits of physical activity, and encouraged her to  engage in regular exercise appropriate for her age and condition.    ------------------------------------------------------------------------------------------------------------ 1. Initial Medicare annual wellness visit *mammogram, dexa ordered *encouraged her to consider lung cancer screening with CT chest *keep up with regular exercise and staying active daily as this also helps with arthritis/joint pain.  *Advance directives/living will information given today.  2. Need for pneumococcal vaccination - Pneumococcal conjugate vaccine 13-valent IM  3. Need for influenza vaccination - Flu Vaccine QUAD 6+ mos PF IM (Fluarix Quad PF)  4. Type 2 diabetes mellitus with diabetic neuropathy, without long-term current use of insulin (HCC) Increase A1C on last check. Encouraged regular, low carb meals. We discussed risks of low/high sugars, esp with glimepiride. There is cost concerns with obtaining medications. [place] number given for assistance. Might not qualify now but should starting next year (income decrease at that time). Recheck A1C in September with virtual follow up with [facility].  - Hemoglobin A1c; Future  5. Screening for breast cancer - [facility] DIGITAL SCREENING BILATERAL; Future  6. Estrogen deficiency - DG Bone Density; Future  Return for A1C recheck in september; then follow up virtual with [facility] after.

## 2018-12-07 ENCOUNTER — Other Ambulatory Visit: Payer: Self-pay | Admitting: Family Medicine

## 2019-01-05 ENCOUNTER — Other Ambulatory Visit: Payer: Self-pay | Admitting: Family Medicine

## 2019-01-28 ENCOUNTER — Other Ambulatory Visit: Payer: Self-pay

## 2019-01-28 ENCOUNTER — Other Ambulatory Visit (INDEPENDENT_AMBULATORY_CARE_PROVIDER_SITE_OTHER): Payer: Medicare Other

## 2019-01-28 DIAGNOSIS — E1140 Type 2 diabetes mellitus with diabetic neuropathy, unspecified: Secondary | ICD-10-CM | POA: Diagnosis not present

## 2019-01-28 LAB — HEMOGLOBIN A1C: Hgb A1c MFr Bld: 6.6 % — ABNORMAL HIGH (ref 4.6–6.5)

## 2019-02-03 ENCOUNTER — Other Ambulatory Visit: Payer: Self-pay | Admitting: Family Medicine

## 2019-02-03 ENCOUNTER — Encounter: Payer: Self-pay | Admitting: Family Medicine

## 2019-02-03 ENCOUNTER — Ambulatory Visit (INDEPENDENT_AMBULATORY_CARE_PROVIDER_SITE_OTHER): Payer: Medicare Other | Admitting: Family Medicine

## 2019-02-03 ENCOUNTER — Other Ambulatory Visit: Payer: Self-pay

## 2019-02-03 DIAGNOSIS — R739 Hyperglycemia, unspecified: Secondary | ICD-10-CM | POA: Diagnosis not present

## 2019-02-03 DIAGNOSIS — E1140 Type 2 diabetes mellitus with diabetic neuropathy, unspecified: Secondary | ICD-10-CM | POA: Diagnosis not present

## 2019-02-03 NOTE — Progress Notes (Signed)
Virtual Visit via Video Note  I connected with Andrea [name]  on 02/03/19 at 10:30 AM EDT by a video enabled telemedicine application and verified that I am speaking with the correct person using two identifiers.  Location patient: home Location provider:work or home office Persons participating in the virtual visit: patient, provider  I discussed the limitations of evaluation and management by telemedicine and the availability of in person appointments. The patient expressed understanding and agreed to proceed.   HPI:  Follow up Hyperglycemia: -Hgba1c was 7.3 in June 2020, advised medication compliance, low sugar diet and regular exercise -recent hgba1c improved to 6.6 -she has been taking medications every day and is eating less -reports feeling well, no low blood sugars  ROS: See pertinent positives and negatives per HPI.  Past Medical History:  Diagnosis Date  . Allergy   . Anemia   . Arthritis   . B12 deficiency 09/09/2018  . CAD (coronary artery disease)    has 3 stents  . Cataract    removed both eyes   . Diabetes mellitus   . Hx of adenomatous colonic polyps 08/09/2014  . Hyperlipidemia   . Hypertension   . Low back pain     Past Surgical History:  Procedure Laterality Date  . BREAST LUMPECTOMY    . carpal tunnel release both hands     . CESAREAN SECTION     2 times  . COLONOSCOPY     x 2- hx colon polyps-   . EYE SURGERY     lens replaced/02/06/15 and 03/09/15  . knee surgery for torn cartillige     right knee  . LASIK  01/12/2014, 03/14/2014  . POLYPECTOMY    . rotator cuff surgery     right side  . stents  2000  . stents 2001  2001   3 stents  . triger finger repair     both hands, right hand done twice  . TUBAL LIGATION      Family History  Problem Relation Age of Onset  . Heart disease Mother   . Hypertension Mother   . Heart disease Father   . Heart disease Sister   . CAD Brother   . Pancreatic cancer Paternal Uncle   . Colitis Neg Hx   . Colon  cancer Neg Hx   . Esophageal cancer Neg Hx   . Rectal cancer Neg Hx   . Stomach cancer Neg Hx     SOCIAL HX: see HPI   Current Outpatient Medications:  .  aspirin 81 MG tablet, Take 81 mg by mouth daily.  , Disp: , Rfl:  .  glimepiride (AMARYL) 4 MG tablet, TAKE 1 TABLET(4 MG) BY MOUTH DAILY BEFORE BREAKFAST, Disp: 90 tablet, Rfl: 0 .  glucose blood (ONE TOUCH ULTRA TEST) test strip, USE TO CHECK BLOOD SUGAR DAILY AND AS NEEDED, Disp: 100 each, Rfl: 5 .  JANUVIA 100 MG tablet, TAKE 1 TABLET(100 MG) BY MOUTH DAILY, Disp: 90 tablet, Rfl: 1 .  metFORMIN (GLUCOPHAGE) 1000 MG tablet, TAKE 1 TABLET BY MOUTH TWICE DAILY AT 4AM AND 6PM ON WORK DAYS AND 7 TO 8 AM AND 6PM ON NON WORK DAYS, Disp: 180 tablet, Rfl: 1 .  ONETOUCH DELICA LANCETS FINE MISC, 1 Stick by Does not apply route daily., Disp: 100 each, Rfl: 3 .  rosuvastatin (CRESTOR) 10 MG tablet, TAKE 1 TABLET(10 MG) BY MOUTH DAILY, Disp: 90 tablet, Rfl: 1 .  valsartan-hydrochlorothiazide (DIOVAN HCT) 160-12.5 MG tablet, Take 1 tablet  by mouth daily., Disp: 90 tablet, Rfl: 3 .  vitamin B-12 (CYANOCOBALAMIN) 1000 MCG tablet, Take 1,000 mcg by mouth daily., Disp: , Rfl:  .  metoprolol tartrate (LOPRESSOR) 50 MG tablet, TAKE 1/2 TABLET BY MOUTH TWICE DAILY, Disp: 90 tablet, Rfl: 0  EXAM:  VITALS per patient if applicable:  GENERAL: alert, oriented, appears well and in no acute distress  HEENT: atraumatic, conjunttiva clear, no obvious abnormalities on inspection of external nose and ears  NECK: normal movements of the head and neck  LUNGS: on inspection no signs of respiratory distress, breathing rate appears normal, no obvious gross SOB, gasping or wheezing  CV: no obvious cyanosis  MS: moves all visible extremities without noticeable abnormality  PSYCH/NEURO: pleasant and cooperative, no obvious depression or anxiety, speech and thought processing grossly intact  ASSESSMENT AND PLAN:  Discussed the following assessment and  plan:  Type 2 diabetes mellitus with diabetic neuropathy, without long-term current use of insulin (HCC)  Hyperglycemia  -doing much better, reviewed recent labs -encouraged healthy low sugar diet and regular exercise -reviewed medications and hypoglycemia monitoring and managment -advised masking, social distancing, good hand hygeine and avoiding gatherings throughout the COVID19 pandemic -follow up 3-4 months  I discussed the assessment and treatment plan with the patient. The patient was provided an opportunity to ask questions and all were answered. The patient agreed with the plan and demonstrated an understanding of the instructions.   The patient was advised to call back or seek an in-person evaluation if the symptoms worsen or if the condition fails to improve as anticipated.   Lucretia Kern, DO

## 2019-02-07 NOTE — Progress Notes (Signed)
Pt will call back to make this appt

## 2019-02-28 ENCOUNTER — Other Ambulatory Visit: Payer: Self-pay

## 2019-02-28 DIAGNOSIS — Z20822 Contact with and (suspected) exposure to covid-19: Secondary | ICD-10-CM

## 2019-03-02 LAB — NOVEL CORONAVIRUS, NAA: SARS-CoV-2, NAA: NOT DETECTED

## 2019-03-10 ENCOUNTER — Other Ambulatory Visit: Payer: Self-pay | Admitting: Family Medicine

## 2019-03-16 NOTE — Telephone Encounter (Signed)
Last OV 02/03/19 Last refill 07/09/18 #90/1 Next OV not scheduled

## 2019-04-13 ENCOUNTER — Other Ambulatory Visit: Payer: Self-pay | Admitting: Family Medicine

## 2019-05-05 ENCOUNTER — Other Ambulatory Visit: Payer: Self-pay | Admitting: Family Medicine

## 2019-05-09 ENCOUNTER — Ambulatory Visit: Payer: Medicare Other | Admitting: Family Medicine

## 2019-05-29 ENCOUNTER — Other Ambulatory Visit: Payer: Self-pay | Admitting: Family Medicine

## 2019-06-16 DIAGNOSIS — E119 Type 2 diabetes mellitus without complications: Secondary | ICD-10-CM | POA: Diagnosis not present

## 2019-06-16 DIAGNOSIS — Z961 Presence of intraocular lens: Secondary | ICD-10-CM | POA: Diagnosis not present

## 2019-06-16 LAB — HM DIABETES EYE EXAM

## 2019-06-17 ENCOUNTER — Other Ambulatory Visit: Payer: Self-pay

## 2019-06-20 ENCOUNTER — Other Ambulatory Visit: Payer: Self-pay

## 2019-06-20 ENCOUNTER — Encounter: Payer: Self-pay | Admitting: Family Medicine

## 2019-06-20 ENCOUNTER — Ambulatory Visit (INDEPENDENT_AMBULATORY_CARE_PROVIDER_SITE_OTHER): Payer: Medicare Other | Admitting: Family Medicine

## 2019-06-20 VITALS — BP 130/58 | HR 67 | Temp 97.3°F | Ht 63.0 in | Wt 150.1 lb

## 2019-06-20 DIAGNOSIS — E1169 Type 2 diabetes mellitus with other specified complication: Secondary | ICD-10-CM | POA: Diagnosis not present

## 2019-06-20 DIAGNOSIS — E1159 Type 2 diabetes mellitus with other circulatory complications: Secondary | ICD-10-CM | POA: Diagnosis not present

## 2019-06-20 DIAGNOSIS — E785 Hyperlipidemia, unspecified: Secondary | ICD-10-CM

## 2019-06-20 DIAGNOSIS — I152 Hypertension secondary to endocrine disorders: Secondary | ICD-10-CM

## 2019-06-20 DIAGNOSIS — I10 Essential (primary) hypertension: Secondary | ICD-10-CM | POA: Diagnosis not present

## 2019-06-20 DIAGNOSIS — E1140 Type 2 diabetes mellitus with diabetic neuropathy, unspecified: Secondary | ICD-10-CM | POA: Diagnosis not present

## 2019-06-20 LAB — COMPREHENSIVE METABOLIC PANEL
ALT: 8 U/L (ref 0–35)
AST: 10 U/L (ref 0–37)
Albumin: 4 g/dL (ref 3.5–5.2)
Alkaline Phosphatase: 60 U/L (ref 39–117)
BUN: 18 mg/dL (ref 6–23)
CO2: 28 mEq/L (ref 19–32)
Calcium: 10.3 mg/dL (ref 8.4–10.5)
Chloride: 100 mEq/L (ref 96–112)
Creatinine, Ser: 1.03 mg/dL (ref 0.40–1.20)
GFR: 53.62 mL/min — ABNORMAL LOW (ref >60.00)
Glucose, Bld: 147 mg/dL — ABNORMAL HIGH (ref 70–99)
Potassium: 4.1 mEq/L (ref 3.5–5.1)
Sodium: 135 mEq/L (ref 135–145)
Total Bilirubin: 0.3 mg/dL (ref 0.2–1.2)
Total Protein: 6.8 g/dL (ref 6.0–8.3)

## 2019-06-20 LAB — CBC WITH DIFFERENTIAL/PLATELET
Basophils Absolute: 0.1 10*3/uL (ref 0.0–0.1)
Basophils Relative: 1.6 % (ref 0.0–3.0)
Eosinophils Absolute: 0.4 10*3/uL (ref 0.0–0.7)
Eosinophils Relative: 5.2 % — ABNORMAL HIGH (ref 0.0–5.0)
HCT: 37.9 % (ref 36.0–46.0)
Hemoglobin: 13.2 g/dL (ref 12.0–15.0)
Lymphocytes Relative: 21.5 % (ref 12.0–46.0)
Lymphs Abs: 1.5 10*3/uL (ref 0.7–4.0)
MCHC: 34.9 g/dL (ref 30.0–36.0)
MCV: 93.5 fl (ref 78.0–100.0)
Monocytes Absolute: 0.3 10*3/uL (ref 0.1–1.0)
Monocytes Relative: 5 % (ref 3.0–12.0)
Neutro Abs: 4.6 10*3/uL (ref 1.4–7.7)
Neutrophils Relative %: 66.7 % (ref 43.0–77.0)
Platelets: 254 10*3/uL (ref 150.0–400.0)
RBC: 4.05 Mil/uL (ref 3.87–5.11)
RDW: 14.2 % (ref 11.5–15.5)
WBC: 6.9 10*3/uL (ref 4.0–10.5)

## 2019-06-20 LAB — HEMOGLOBIN A1C: Hgb A1c MFr Bld: 5.9 % (ref 4.6–6.5)

## 2019-06-20 LAB — LIPID PANEL
Cholesterol: 108 mg/dL (ref 0–200)
HDL: 29.6 mg/dL — ABNORMAL LOW (ref >39.00)
NonHDL: 78.4
Total CHOL/HDL Ratio: 4
Triglycerides: 271 mg/dL — ABNORMAL HIGH (ref 0.0–149.0)
VLDL: 54.2 mg/dL — ABNORMAL HIGH (ref 0.0–40.0)

## 2019-06-20 LAB — LDL CHOLESTEROL, DIRECT: Direct LDL: 51 mg/dL

## 2019-06-20 LAB — MICROALBUMIN / CREATININE URINE RATIO
Creatinine,U: 96.3 mg/dL
Microalb Creat Ratio: 0.9 mg/g (ref 0.0–30.0)
Microalb, Ur: 0.8 mg/dL (ref 0.0–1.9)

## 2019-06-20 NOTE — Progress Notes (Signed)
Andrea [name] DOB: 1955-07-29 Encounter date: 06/20/2019  This is a 64 y.o. female who presents with Chief Complaint  Patient presents with  . Follow-up    History of present illness: DMII: last A1C 01/2019 had improved to 6.6 (from 7.3 in June). In the morning sugars are about 134. Not eating much. Not much appetite in the evening but eats a good breakfast; this is not a change.   ?mammogram and dexa not completed: States that she postponed and has now lost number.  She has a lot of stress going on.  Son and daughter-in-law are using drugs, and this creates a very stressful environment for her.  She worries about them constantly.  Celesta Gentile is expensive, but she has a health card now from work which she can use for this. Others are ok.   Has declined lung cancer screening test.  Allergies  Allergen Reactions  . Plavix [Clopidogrel Bisulfate]     Caused platelets to drop   Current Meds  Medication Sig  . aspirin 81 MG tablet Take 81 mg by mouth daily.    [name] glimepiride (AMARYL) 4 MG tablet TAKE 1 TABLET(4 MG) BY MOUTH DAILY BEFORE BREAKFAST  . glucose blood (ONE TOUCH ULTRA TEST) test strip USE TO CHECK BLOOD SUGAR DAILY AND AS NEEDED  . JANUVIA 100 MG tablet TAKE 1 TABLET(100 MG) BY MOUTH DAILY  . metFORMIN (GLUCOPHAGE) 1000 MG tablet TAKE 1 TABLET BY MOUTH TWICE DAILY AT 4 AM AND 6 PM ON WORK DAYS AND 7 TO 8 AM AND 6 PM ON NON WORK DAYS  . metoprolol tartrate (LOPRESSOR) 50 MG tablet TAKE 1/2 TABLET BY MOUTH TWICE DAILY  . ONETOUCH DELICA LANCETS FINE MISC 1 Stick by Does not apply route daily.  . rosuvastatin (CRESTOR) 10 MG tablet TAKE 1 TABLET(10 MG) BY MOUTH DAILY  . valsartan-hydrochlorothiazide (DIOVAN HCT) 160-12.5 MG tablet Take 1 tablet by mouth daily.  . vitamin B-12 (CYANOCOBALAMIN) 1000 MCG tablet Take 1,000 mcg by mouth daily.    Review of Systems  Constitutional: Negative for chills, fatigue and fever.  Respiratory: Negative for cough, chest tightness, shortness  of breath and wheezing.   Cardiovascular: Negative for chest pain, palpitations and leg swelling.    Objective:  BP (!) 130/58 (BP Location: Left Arm, Patient Position: Sitting, Cuff Size: Normal)   Pulse 67   Temp (!) 97.3 F (36.3 C) (Temporal)   Ht 5\' 3"  (1.6 m)   Wt 150 lb 1.6 oz (68.1 kg)   SpO2 95%   BMI 26.59 kg/m   Weight: 150 lb 1.6 oz (68.1 kg)   BP Readings from Last 3 Encounters:  06/20/19 (!) 130/58  12/01/18 136/64  09/09/18 138/62   Wt Readings from Last 3 Encounters:  06/20/19 150 lb 1.6 oz (68.1 kg)  12/01/18 149 lb 1.6 oz (67.6 kg)  04/29/18 148 lb (67.1 kg)    Physical Exam Constitutional:      General: She is not in acute distress.    Appearance: She is well-developed.  HENT:     Head: Normocephalic and atraumatic.  Cardiovascular:     Rate and Rhythm: Normal rate and regular rhythm.     Heart sounds: Normal heart sounds. No murmur.  Pulmonary:     Effort: Pulmonary effort is normal.     Breath sounds: Normal breath sounds.  Abdominal:     General: Bowel sounds are normal. There is no distension.     Palpations: Abdomen is soft.  Tenderness: There is no abdominal tenderness. There is no guarding.  Skin:    General: Skin is warm and dry.     Comments: Sensory exam of the foot is normal, tested with the monofilament. Good pulses, no lesions or ulcers, good peripheral pulses.  0.25 cm speckled mole right upper back  Psychiatric:        Judgment: Judgment normal.     Assessment/Plan  1. Hypertension associated with diabetes ([place]) Blood pressure well controlled.  Continue current medications. - CBC with Differential/Platelet; Future - Comprehensive metabolic panel; Future - Ambulatory referral to Chronic Care Management Services - Comprehensive metabolic panel - CBC with Differential/Platelet  2. Type 2 diabetes mellitus with diabetic neuropathy, without long-term current use of insulin ([place]) We will recheck blood work today. -  Hemoglobin A1c; Future - Microalbumin / creatinine urine ratio; Future - [facility] DIABETES FOOT EXAM; Future - Ambulatory referral to Chronic Care Management Services - Microalbumin / creatinine urine ratio - Hemoglobin A1c  3. Hyperlipidemia associated with type 2 diabetes mellitus ([place]) Cholesterols been well controlled previously.  Continue with Crestor.  Referral to chronic care management services to help with any medication assistance available for her.  Januvia is of significant cost for her. - Lipid panel; Future - Ambulatory referral to Chronic Care Management Services - Lipid panel    Return in about 6 months (around 12/21/2019) for physical exam. Information about rehab facilities in the area given to her today to offer to son.  We discussed process of addiction somewhat today and self-care for her with regards to managing this difficult situation.    [name], MD

## 2019-06-24 ENCOUNTER — Other Ambulatory Visit: Payer: Self-pay | Admitting: Family Medicine

## 2019-06-28 ENCOUNTER — Other Ambulatory Visit: Payer: Self-pay

## 2019-06-28 NOTE — Patient Outreach (Signed)
[facility]) Care Management  06/28/2019  Andrea [name] April 26, 1955 [id number]   Medication Adherence call to Andrea [name] HIPPA Compliant Voice message left with a call back number. Andrea [name] is showing past due on Rosuvastatin 10 mg under [facility].   [place] Management Direct Dial [phone]  Fax [phone] [email or web address]

## 2019-06-29 ENCOUNTER — Telehealth: Payer: Self-pay | Admitting: Family Medicine

## 2019-06-29 NOTE — Progress Notes (Signed)
°  Chronic Care Management   Outreach Note  06/29/2019 Name: Andrea [name] MRN: [id number] DOB: [birth year]  Referred by: Caren Macadam, MD Reason for referral : No chief complaint on file.   An unsuccessful telephone outreach was attempted today. The patient was referred to the pharmacist for assistance with care management and care coordination.   Follow Up Plan:   [name] [employer] Scheduler

## 2019-06-29 NOTE — Chronic Care Management (AMB) (Signed)
  Chronic Care Management   Note  06/29/2019 Name: Andrea [name] MRN: [id number] DOB: Feb 06, 1956  Andrea [name] is a 64 y.o. year old female who is a primary care patient of Koberlein, Steele Berg, MD. I reached out to Lorayne Bender by phone today in response to a referral sent by Ms. Akayla D Recore's PCP, Caren Macadam, MD.   Ms. Cisar was given information about Chronic Care Management services today including:  1. CCM service includes personalized support from designated clinical staff supervised by her physician, including individualized plan of care and coordination with other care providers 2. 24/7 contact phone numbers for assistance for urgent and routine care needs. 3. Service will only be billed when office clinical staff spend 20 minutes or more in a month to coordinate care. 4. Only one practitioner may furnish and bill the service in a calendar month. 5. The patient may stop CCM services at any time (effective at the end of the month) by phone call to the office staff.   Patient agreed to services and verbal consent obtained.   Follow up plan:   [name] [employer] Scheduler

## 2019-07-07 ENCOUNTER — Other Ambulatory Visit: Payer: Self-pay | Admitting: Family Medicine
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
## 2019-07-18 ENCOUNTER — Other Ambulatory Visit: Payer: Self-pay

## 2019-07-18 ENCOUNTER — Ambulatory Visit: Payer: Medicare Other

## 2019-07-18 DIAGNOSIS — I10 Essential (primary) hypertension: Secondary | ICD-10-CM

## 2019-07-18 DIAGNOSIS — E1169 Type 2 diabetes mellitus with other specified complication: Secondary | ICD-10-CM

## 2019-07-18 DIAGNOSIS — E1140 Type 2 diabetes mellitus with diabetic neuropathy, unspecified: Secondary | ICD-10-CM

## 2019-07-18 DIAGNOSIS — I2510 Atherosclerotic heart disease of native coronary artery without angina pectoris: Secondary | ICD-10-CM

## 2019-07-18 DIAGNOSIS — E538 Deficiency of other specified B group vitamins: Secondary | ICD-10-CM

## 2019-07-18 NOTE — Patient Instructions (Addendum)
Visit Information  Goals Addressed            This Visit's Progress   . Pharmacy Care Plan       CARE PLAN ENTRY  Current Barriers:  . Chronic Disease Management support, education, and care coordination needs related to  Hypertension, Diabetes, Hyperlipidemia/ coronary atherosclerosis, Vitamin B12 deficiency   Pharmacist Clinical Goal(s):  [name] Work with the care management team to address educational, disease management, and care coordination needs  . Call provider office for new or worsened signs and symptoms  . Continue self health monitoring activities as directed today  . Call care management team with questions or concerns.  . Diabetes:  [name] Maintain A1c < 7.0%. . Recent A1c: 5.9%  . Maintain up to date on diabetes preventative health (eye exams every 1 to 2 years and foot exams at least once yearly).  . Blood pressure:  [name] Maintain blood pressure within goal of your provider (130/80 or 140/90)  . Maintain low salt diet.  . Within the next month, obtain blood pressure monitor and continue self monitoring readings.  . Recent blood pressure reading: 130/58 (06/20/2019)  . High cholesterol/ coronary atherosclerosis . Cholesterol goals: Total Cholesterol goal under 200, Triglycerides goal under 150, HDL goal above 40 (men) or above 50 (women), LDL goal under 70.  [name] Recent cholesterol levels (06/20/2019) - Total cholesterol: 108 - Triglycerides: 271 - HDL: 29.60 - LDL: 51 . Vitamin B12 deficiency . Maintain Vitamin B12 level at or above 211.  Interventions: . Comprehensive medication review performed. . Diabetes . Discussed importance of diabetes preventative health exams. . Obtain diabetic eye exam every 1 to 2 years.  . Obtain at least once yearly foot exams. . Continue:  - glimepiride (Amaryl) 4mg , 1 tablet once daily before breakfast - metformin 1000mg , 1 tablet twice daily  - Hold: Januvia 100mg , 1 tablet once daily (hold for 3 months and follow up on 09/30/2019 for  reassessment.  . Blood pressure:  . Discussed need to continue checking blood pressure at home.  . Discussed diet modifications. DASH diet:  following a diet emphasizing fruits and vegetables and low-fat dairy products along with whole grains, fish, poultry, and nuts. Reducing red meats and sugars.  . Exercising  . Reducing the amount of salt intake to 1500mg /per day.  . Recommend using a salt substitute to replace your salt if you need flavor.    . Continue:  - metoprolol tartrate (Lopressor) 50mg , 0.5 (one-half) tablet twice daily  - valsartan/ HCTZ 160/12.5mg , 1 tablet once daily  . High cholesterol/ coronary atherosclerosis (history of stents) . How to reduce cholesterol through diet/weight management and physical activity.    . We discussed how a diet high in plant sterols (fruits/vegetables/nuts/whole grains/legumes) may reduce your cholesterol.  Encouraged increasing fiber to a daily intake of 10-25g/day  . Continue:  - rosuvastatin (Crestor) 10mg , 1 tablet once daily  - aspirin 81mg , 1 tablet once daily . Vitamin B12 deficiency . Continue:  - vitamin B12 521mcg, 1 tablet once daily   Patient Self Care Activities:  . Self administers medications as prescribed and Calls provider office for new concerns or questions . Continue current medications as directed by providers.  . Continue following up with primary care provider and/or specialists. . Continue at home blood pressure readings. . Continue checking blood sugars.  . Continue working on health habits (diet/ exercise).  Initial goal documentation        Ms. Kehs was given information about  Chronic Care Management services today including:  1. CCM service includes personalized support from designated clinical staff supervised by her physician, including individualized plan of care and coordination with other care providers 2. 24/7 contact phone numbers for assistance for urgent and routine care needs. 3. Standard  insurance, coinsurance, copays and deductibles apply for chronic care management only during months in which we provide at least 20 minutes of these services. Most insurances cover these services at 100%, however patients may be responsible for any copay, coinsurance and/or deductible if applicable. This service may help you avoid the need for more expensive face-to-face services. 4. Only one practitioner may furnish and bill the service in a calendar month. 5. The patient may stop CCM services at any time (effective at the end of the month) by phone call to the office staff.  Patient agreed to services and verbal consent obtained.   The patient verbalized understanding of instructions provided today and agreed to receive a mailed copy of patient instruction and/or educational materials. Telephone follow up appointment with pharmacy team member scheduled for: 07/17/2020  Anson Crofts, PharmD Clinical Pharmacist [facility] [place] [phone]   [place] stands for "Dietary Approaches to Stop Hypertension." The DASH eating plan is a healthy eating plan that has been shown to reduce high blood pressure (hypertension). It may also reduce your risk for type 2 diabetes, heart disease, and stroke. The DASH eating plan may also help with weight loss. What are tips for following this plan?  General guidelines  Avoid eating more than 2,300 mg (milligrams) of salt (sodium) a day. If you have hypertension, you may need to reduce your sodium intake to 1,500 mg a day.  Limit alcohol intake to no more than 1 drink a day for nonpregnant women and 2 drinks a day for men. One drink equals 12 oz of beer, 5 oz of wine, or 1 oz of hard liquor.  Work with your health care provider to maintain a healthy body weight or to lose weight. Ask what an ideal weight is for you.  Get at least 30 minutes of exercise that causes your heart to beat faster (aerobic exercise) most days  of the week. Activities may include walking, swimming, or biking.  Work with your health care provider or diet and nutrition specialist (dietitian) to adjust your eating plan to your individual calorie needs. Reading food labels   Check food labels for the amount of sodium per serving. Choose foods with less than 5 percent of the Daily Value of sodium. Generally, foods with less than 300 mg of sodium per serving fit into this eating plan.  To find whole grains, look for the word "whole" as the first word in the ingredient list. Shopping  Buy products labeled as "low-sodium" or "no salt added."  Buy fresh foods. Avoid canned foods and premade or frozen meals. Cooking  Avoid adding salt when cooking. Use salt-free seasonings or herbs instead of table salt or sea salt. Check with your health care provider or pharmacist before using salt substitutes.  Do not fry foods. Cook foods using healthy methods such as baking, boiling, grilling, and broiling instead.  Cook with heart-healthy oils, such as olive, canola, soybean, or sunflower oil. Meal planning  Eat a balanced diet that includes: ? 5 or more servings of fruits and vegetables each day. At each meal, try to fill half of your plate with fruits and vegetables. ? Up to 6-8 servings of  whole grains each day. ? Less than 6 oz of lean meat, poultry, or fish each day. A 3-oz serving of meat is about the same size as a deck of cards. One egg equals 1 oz. ? 2 servings of low-fat dairy each day. ? A serving of nuts, seeds, or beans 5 times each week. ? Heart-healthy fats. Healthy fats called Omega-3 fatty acids are found in foods such as flaxseeds and coldwater fish, like sardines, salmon, and mackerel.  Limit how much you eat of the following: ? Canned or prepackaged foods. ? Food that is high in trans fat, such as fried foods. ? Food that is high in saturated fat, such as fatty meat. ? Sweets, desserts, sugary drinks, and other foods with  added sugar. ? Full-fat dairy products.  Do not salt foods before eating.  Try to eat at least 2 vegetarian meals each week.  Eat more home-cooked food and less restaurant, buffet, and fast food.  When eating at a restaurant, ask that your food be prepared with less salt or no salt, if possible. What foods are recommended? The items listed may not be a complete list. Talk with your dietitian about what dietary choices are best for you. Grains Whole-grain or whole-wheat bread. Whole-grain or whole-wheat pasta. Brown rice. [place]. Bulgur. Whole-grain and low-sodium cereals. Pita bread. Low-fat, low-sodium crackers. Whole-wheat flour tortillas. Vegetables Fresh or frozen vegetables (raw, steamed, roasted, or grilled). Low-sodium or reduced-sodium tomato and vegetable juice. Low-sodium or reduced-sodium tomato sauce and tomato paste. Low-sodium or reduced-sodium canned vegetables. Fruits All fresh, dried, or frozen fruit. Canned fruit in natural juice (without added sugar). Meat and other protein foods Skinless chicken or [place]. Ground chicken or [place]. Pork with fat trimmed off. Fish and seafood. Egg whites. Dried beans, peas, or lentils. Unsalted nuts, nut butters, and seeds. Unsalted canned beans. Lean cuts of beef with fat trimmed off. Low-sodium, lean deli meat. Dairy Low-fat (1%) or fat-free (skim) milk. Fat-free, low-fat, or reduced-fat cheeses. Nonfat, low-sodium ricotta or cottage cheese. Low-fat or nonfat yogurt. Low-fat, low-sodium cheese. Fats and oils Soft margarine without trans fats. Vegetable oil. Low-fat, reduced-fat, or light mayonnaise and salad dressings (reduced-sodium). Canola, safflower, olive, soybean, and sunflower oils. Avocado. Seasoning and other foods Herbs. Spices. Seasoning mixes without salt. Unsalted popcorn and pretzels. Fat-free sweets. What foods are not recommended? The items listed may not be a complete list. Talk with your dietitian about what  dietary choices are best for you. Grains Baked goods made with fat, such as croissants, muffins, or some breads. Dry pasta or rice meal packs. Vegetables Creamed or fried vegetables. Vegetables in a cheese sauce. Regular canned vegetables (not low-sodium or reduced-sodium). Regular canned tomato sauce and paste (not low-sodium or reduced-sodium). Regular tomato and vegetable juice (not low-sodium or reduced-sodium). Angie Fava. Olives. Fruits Canned fruit in a light or heavy syrup. Fried fruit. Fruit in cream or butter sauce. Meat and other protein foods Fatty cuts of meat. Ribs. Fried meat. Berniece Salines. Sausage. Bologna and other processed lunch meats. Salami. Fatback. Hotdogs. Bratwurst. Salted nuts and seeds. Canned beans with added salt. Canned or smoked fish. Whole eggs or egg yolks. Chicken or [place] with skin. Dairy Whole or 2% milk, cream, and half-and-half. Whole or full-fat cream cheese. Whole-fat or sweetened yogurt. Full-fat cheese. Nondairy creamers. Whipped toppings. Processed cheese and cheese spreads. Fats and oils Butter. Stick margarine. Lard. Shortening. Ghee. Bacon fat. Tropical oils, such as coconut, palm kernel, or palm oil. Seasoning and other foods Salted  popcorn and pretzels. Onion salt, garlic salt, seasoned salt, table salt, and sea salt. Worcestershire sauce. Tartar sauce. Barbecue sauce. Teriyaki sauce. Soy sauce, including reduced-sodium. Steak sauce. Canned and packaged gravies. Fish sauce. Oyster sauce. Cocktail sauce. Horseradish that you find on the shelf. Ketchup. Mustard. Meat flavorings and tenderizers. Bouillon cubes. Hot sauce and Tabasco sauce. Premade or packaged marinades. Premade or packaged taco seasonings. Relishes. Regular salad dressings. Where to find more information:  National Heart, Lung, and [place]: https://wilson-eaton.com/  American Heart Association: www.heart.org Summary  The DASH eating plan is a healthy eating plan that has been shown to reduce  high blood pressure (hypertension). It may also reduce your risk for type 2 diabetes, heart disease, and stroke.  With the DASH eating plan, you should limit salt (sodium) intake to 2,300 mg a day. If you have hypertension, you may need to reduce your sodium intake to 1,500 mg a day.  When on the DASH eating plan, aim to eat more fresh fruits and vegetables, whole grains, lean proteins, low-fat dairy, and heart-healthy fats.  Work with your health care provider or diet and nutrition specialist (dietitian) to adjust your eating plan to your individual calorie needs. This information is not intended to replace advice given to you by your health care provider. Make sure you discuss any questions you have with your health care provider. Document Revised: 03/13/2017 Document Reviewed: 03/24/2016 Elsevier Patient Education  2020 [employer].

## 2019-07-26 ENCOUNTER — Other Ambulatory Visit: Payer: Self-pay | Admitting: Family Medicine

## 2019-07-28 ENCOUNTER — Telehealth: Payer: Self-pay | Admitting: Family Medicine

## 2019-07-28 NOTE — Telephone Encounter (Signed)
Pt states that Dr. Maudie Mercury took her off Januvia due to her blood sugar was 5.1 and low the previous time, so she wanted to see if it would change. She was informed to double up on something else or take something else and forgot what JoAnne told her. Pt states her blood sugar started increasing (Friday 4/9-- 172, 4/10 --157, 4/11--141, 4/12--156,4/13-- 161,4/14-- 150, 4/15--- 151)   Pt would like call back at [phone] -ok to leave detailed message per pt.

## 2019-07-29 ENCOUNTER — Other Ambulatory Visit: Payer: Self-pay | Admitting: Family Medicine

## 2019-07-29 NOTE — Telephone Encounter (Signed)
Spoke with the pt and she stated the glucose readings below were fasting.  Patient stated she cannot recall any changes to her diet that would cause an increase as she has cut down on the amount of sodas she used to drink.  Patient stated she did not call for patient assistance as she does not have a problem paying for Januvia and has a 3 month supply left from a previous Rx that was given.  Also stated the glucometer she has is working correctly.  Message sent to PCP.

## 2019-07-29 NOTE — Telephone Encounter (Signed)
Ok; fine to restart the [place] that she has. Please update next week with sugars and let us know if not seeing improvement when starting.

## 2019-07-29 NOTE — Telephone Encounter (Signed)
Spoke with the pt, informed her of the message below and the pt agreed to call back next week.

## 2019-07-29 NOTE — Telephone Encounter (Signed)
We held Januvia due to cost issues with it and good A1C. I don't see note of doubling other meds/changing other meds. Are those sugars fasting for her? Any changes in diet that might be contributing?   *I would like to hear if those are fasting sugars and if not then see what her fasting sugars have been as well as 2-3 hours post meal.  *any low sugars? *I do not remember if she tried to call to get assistance with januvia in the past. She can try:  [phone] which is their assistance number and ask them to see if she is eligible for medication assistance. She may be able to get this medication free and if it works well for her then that would be a win! *also just want to make sure that glucometer is accurate/calibrated recently.    Let me know about above so we can work on this.  Thanks!

## 2019-08-26 ENCOUNTER — Other Ambulatory Visit: Payer: Self-pay | Admitting: Family Medicine

## 2019-09-20 ENCOUNTER — Other Ambulatory Visit: Payer: Self-pay | Admitting: Family Medicine

## 2019-09-30 ENCOUNTER — Ambulatory Visit: Payer: Medicare Other | Admitting: Family Medicine

## 2019-10-06 ENCOUNTER — Other Ambulatory Visit: Payer: Self-pay | Admitting: Family Medicine

## 2019-10-14 ENCOUNTER — Other Ambulatory Visit: Payer: Self-pay | Admitting: Family Medicine

## 2019-12-05 ENCOUNTER — Telehealth: Payer: Self-pay | Admitting: Family Medicine

## 2019-12-05 NOTE — Telephone Encounter (Signed)
Pt is calling in stating that [facility] is needing new orders for the pt to have a Mammogram and Bone Density done.

## 2019-12-06 ENCOUNTER — Other Ambulatory Visit: Payer: Self-pay | Admitting: Family Medicine

## 2019-12-06 DIAGNOSIS — E2839 Other primary ovarian failure: Secondary | ICD-10-CM

## 2019-12-06 DIAGNOSIS — Z1231 Encounter for screening mammogram for malignant neoplasm of breast: Secondary | ICD-10-CM

## 2019-12-06 NOTE — Telephone Encounter (Signed)
I placed new orders.  The previous ones had expired since they were ordered over a year ago.

## 2019-12-06 NOTE — Telephone Encounter (Signed)
Spoke with the pt and informed her of the message below.  [facility]t.

## 2019-12-14 ENCOUNTER — Telehealth: Payer: Self-pay | Admitting: Family Medicine

## 2019-12-14 NOTE — Telephone Encounter (Signed)
Spoke with the pt and she stated she would prefer to have both tests done at [facility] instead of at the [facility].  State she called [facility] and they told her an order was needed.  An order form was completed and faxed to [facility] at [phone].

## 2019-12-14 NOTE — Telephone Encounter (Signed)
Pt wants a call back about her mammogram and bone density test  Please call [phone]

## 2019-12-19 ENCOUNTER — Other Ambulatory Visit: Payer: Self-pay | Admitting: Family Medicine

## 2019-12-21 ENCOUNTER — Encounter: Payer: Self-pay | Admitting: Family Medicine

## 2019-12-21 ENCOUNTER — Ambulatory Visit (INDEPENDENT_AMBULATORY_CARE_PROVIDER_SITE_OTHER): Payer: Medicare Other | Admitting: Family Medicine

## 2019-12-21 ENCOUNTER — Other Ambulatory Visit: Payer: Self-pay

## 2019-12-21 VITALS — BP 116/60 | HR 67 | Temp 98.1°F | Ht 63.75 in | Wt 151.3 lb

## 2019-12-21 DIAGNOSIS — E538 Deficiency of other specified B group vitamins: Secondary | ICD-10-CM | POA: Diagnosis not present

## 2019-12-21 DIAGNOSIS — E1140 Type 2 diabetes mellitus with diabetic neuropathy, unspecified: Secondary | ICD-10-CM | POA: Diagnosis not present

## 2019-12-21 DIAGNOSIS — E1159 Type 2 diabetes mellitus with other circulatory complications: Secondary | ICD-10-CM

## 2019-12-21 DIAGNOSIS — I152 Hypertension secondary to endocrine disorders: Secondary | ICD-10-CM

## 2019-12-21 DIAGNOSIS — E1169 Type 2 diabetes mellitus with other specified complication: Secondary | ICD-10-CM | POA: Diagnosis not present

## 2019-12-21 DIAGNOSIS — Z23 Encounter for immunization: Secondary | ICD-10-CM | POA: Diagnosis not present

## 2019-12-21 DIAGNOSIS — E559 Vitamin D deficiency, unspecified: Secondary | ICD-10-CM

## 2019-12-21 DIAGNOSIS — I10 Essential (primary) hypertension: Secondary | ICD-10-CM

## 2019-12-21 DIAGNOSIS — Z0000 Encounter for general adult medical examination without abnormal findings: Secondary | ICD-10-CM

## 2019-12-21 DIAGNOSIS — E785 Hyperlipidemia, unspecified: Secondary | ICD-10-CM

## 2019-12-21 NOTE — Patient Instructions (Signed)
Get Tdap at pharmacy when able
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
## 2019-12-22 LAB — CBC WITH DIFFERENTIAL/PLATELET
Absolute Monocytes: 422 cells/uL (ref 200–950)
Basophils Absolute: 133 cells/uL (ref 0–200)
Basophils Relative: 1.8 %
Eosinophils Absolute: 289 cells/uL (ref 15–500)
Eosinophils Relative: 3.9 %
HCT: 37.2 % (ref 35.0–45.0)
Hemoglobin: 12.6 g/dL (ref 11.7–15.5)
Lymphs Abs: 1569 cells/uL (ref 850–3900)
MCH: 32.1 pg (ref 27.0–33.0)
MCHC: 33.9 g/dL (ref 32.0–36.0)
MCV: 94.9 fL (ref 80.0–100.0)
MPV: 10.5 fL (ref 7.5–12.5)
Monocytes Relative: 5.7 %
Neutro Abs: 4988 cells/uL (ref 1500–7800)
Neutrophils Relative %: 67.4 %
Platelets: 259 10*3/uL (ref 140–400)
RBC: 3.92 10*6/uL (ref 3.80–5.10)
RDW: 13.8 % (ref 11.0–15.0)
Total Lymphocyte: 21.2 %
WBC: 7.4 10*3/uL (ref 3.8–10.8)

## 2019-12-22 LAB — COMPREHENSIVE METABOLIC PANEL
AG Ratio: 1.7 (calc) (ref 1.0–2.5)
ALT: 8 U/L (ref 6–29)
AST: 19 U/L (ref 10–35)
Albumin: 4.3 g/dL (ref 3.6–5.1)
Alkaline phosphatase (APISO): 47 U/L (ref 37–153)
BUN: 18 mg/dL (ref 7–25)
CO2: 25 mmol/L (ref 20–32)
Calcium: 10.8 mg/dL — ABNORMAL HIGH (ref 8.6–10.4)
Chloride: 104 mmol/L (ref 98–110)
Creat: 0.97 mg/dL (ref 0.50–0.99)
Globulin: 2.6 g/dL (calc) (ref 1.9–3.7)
Glucose, Bld: 131 mg/dL — ABNORMAL HIGH (ref 65–99)
Potassium: 3.8 mmol/L (ref 3.5–5.3)
Sodium: 138 mmol/L (ref 135–146)
Total Bilirubin: 0.2 mg/dL (ref 0.2–1.2)
Total Protein: 6.9 g/dL (ref 6.1–8.1)

## 2019-12-22 LAB — TSH: TSH: 3.29 mIU/L (ref 0.40–4.50)

## 2019-12-22 LAB — LIPID PANEL
Cholesterol: 111 mg/dL (ref <200)
HDL: 31 mg/dL — ABNORMAL LOW (ref >=50)
LDL Cholesterol (Calc): 46 mg/dL (calc)
Non-HDL Cholesterol (Calc): 80 mg/dL (calc) (ref <130)
Total CHOL/HDL Ratio: 3.6 (calc) (ref <5.0)
Triglycerides: 321 mg/dL — ABNORMAL HIGH (ref <150)

## 2019-12-22 LAB — HEMOGLOBIN A1C
Hgb A1c MFr Bld: 6.2 % of total Hgb — ABNORMAL HIGH (ref <5.7)
Mean Plasma Glucose: 131 (calc)
eAG (mmol/L): 7.3 (calc)

## 2019-12-22 LAB — MICROALBUMIN / CREATININE URINE RATIO
Creatinine, Urine: 153 mg/dL (ref 20–275)
Microalb Creat Ratio: 10 mcg/mg creat (ref <30)
Microalb, Ur: 1.5 mg/dL

## 2019-12-22 LAB — VITAMIN B12: Vitamin B-12: 656 pg/mL (ref 200–1100)

## 2019-12-22 LAB — VITAMIN D 25 HYDROXY (VIT D DEFICIENCY, FRACTURES): Vit D, 25-Hydroxy: 35 ng/mL (ref 30–100)

## 2020-01-10 ENCOUNTER — Ambulatory Visit (INDEPENDENT_AMBULATORY_CARE_PROVIDER_SITE_OTHER): Payer: Medicare Other

## 2020-01-10 ENCOUNTER — Other Ambulatory Visit: Payer: Self-pay

## 2020-01-10 DIAGNOSIS — Z0000 Encounter for general adult medical examination without abnormal findings: Secondary | ICD-10-CM

## 2020-01-10 NOTE — Progress Notes (Addendum)
Virtual Visit via Telephone Note  I connected with  Andrea [name] on 01/10/20 at  8:00 AM EDT by telephone and verified that I am speaking with the correct person using two identifiers.  Medicare Annual Wellness visit completed telephonically due to Covid-19 pandemic.   Persons participating in this call: This Health Coach and this patient.   Location: Patient: Home Provider: Office   I discussed the limitations, risks, security and privacy concerns of performing an evaluation and management service by telephone and the availability of in person appointments. The patient expressed understanding and agreed to proceed.  Unable to perform video visit due to video visit attempted and failed and/or patient does not have video capability.   Some vital signs may be absent or patient reported.   Willette Brace, LPN    Subjective:   Andrea [name] is a 64 y.o. female who presents for an Initial Medicare Annual Wellness Visit.  Review of Systems     Cardiac Risk Factors include: advanced age (>81men, >49 women);diabetes mellitus;dyslipidemia;hypertension     Objective:    There were no vitals filed for this visit. There is no height or weight on file to calculate BMI.  Advanced Directives 01/10/2020 08/01/2014  Does Patient Have a Medical Advance Directive? No No  Would patient like information on creating a medical advance directive? Yes (MAU/Ambulatory/Procedural Areas - Information given) -    Current Medications (verified) Outpatient Encounter Medications as of 01/10/2020  Medication Sig  . aspirin 81 MG tablet Take 81 mg by mouth daily.    . cetirizine (ZYRTEC) 10 MG tablet Take 10 mg by mouth daily.  [name] glimepiride (AMARYL) 4 MG tablet TAKE 1 TABLET(4 MG) BY MOUTH DAILY BEFORE BREAKFAST  . JANUVIA 100 MG tablet TAKE 1 TABLET(100 MG) BY MOUTH DAILY  . metFORMIN (GLUCOPHAGE) 1000 MG tablet TAKE 1 TABLET BY MOUTH TWICE DAILY AT 4AM AND 6 PM ON WORK DAYS AND 7-8AM AND 6PM ON NON  WORK DAYS  . metoprolol tartrate (LOPRESSOR) 50 MG tablet TAKE 1/2 TABLET BY MOUTH TWICE DAILY  . ONETOUCH DELICA LANCETS FINE MISC 1 Stick by Does not apply route daily.  Glory Rosebush ULTRA test strip USE TO CHECK BLOOD SUGAR DAILY AND AS NEEDED  . rosuvastatin (CRESTOR) 10 MG tablet TAKE 1 TABLET(10 MG) BY MOUTH DAILY  . valsartan-hydrochlorothiazide (DIOVAN-HCT) 160-12.5 MG tablet TAKE 1 TABLET BY MOUTH DAILY  . vitamin B-12 (CYANOCOBALAMIN) 500 MCG tablet Take 500 mcg by mouth daily.    No facility-administered encounter medications on file as of 01/10/2020.    Allergies (verified) Plavix [clopidogrel bisulfate]   History: Past Medical History:  Diagnosis Date  . Allergy   . Anemia   . Arthritis   . B12 deficiency 09/09/2018  . CAD (coronary artery disease)    has 3 stents  . Cataract    removed both eyes   . Diabetes mellitus   . Hx of adenomatous colonic polyps 08/09/2014  . Hyperlipidemia   . Hypertension   . Low back pain    Past Surgical History:  Procedure Laterality Date  . BREAST LUMPECTOMY    . carpal tunnel release both hands     . CESAREAN SECTION     2 times  . COLONOSCOPY     x 2- hx colon polyps-   . EYE SURGERY     lens replaced/02/06/15 and 03/09/15  . knee surgery for torn cartillige     right knee  . LASIK  01/12/2014,  03/14/2014  . POLYPECTOMY    . rotator cuff surgery     right side  . stents  2000  . stents 2001  2001   3 stents  . triger finger repair     both hands, right hand done twice  . TUBAL LIGATION     Family History  Problem Relation Age of Onset  . Heart disease Mother   . Hypertension Mother   . Heart disease Father   . Heart disease Sister   . CAD Brother        stenting  . Other Sister        tumor on diaphragm; oxygen depdt  . Pancreatic cancer Paternal Uncle   . Colitis Neg Hx   . Colon cancer Neg Hx   . Esophageal cancer Neg Hx   . Rectal cancer Neg Hx   . Stomach cancer Neg Hx    Social History    Socioeconomic History  . Marital status: Divorced    Spouse name: Not on file  . Number of children: Not on file  . Years of education: Not on file  . Highest education level: Not on file  Occupational History  . Occupation: Retired  Tobacco Use  . Smoking status: Former Smoker    Types: E-cigarettes    Quit date: 06/20/2010    Years since quitting: 9.5  . Smokeless tobacco: Never Used  . Tobacco comment: smoked 1ppd for> 30 years/smokes occasionally,some vapor cigarettes  Substance and Sexual Activity  . Alcohol use: No    Alcohol/week: 0.0 standard drinks  . Drug use: No  . Sexual activity: Yes  Other Topics Concern  . 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 Difficulty of Paying Living Expenses: Not hard at all  Food Insecurity: No Food Insecurity  . Worried About Charity fundraiser in the Last Year: Never true  . Ran Out of Food in the Last Year: Never true  Transportation Needs: No Transportation Needs  . Lack of Transportation (Medical): No  . Lack of Transportation (Non-Medical): No  Physical Activity: Insufficiently Active  . Days of Exercise per Week: 3 days  . Minutes of Exercise per Session: 20 min  Stress: No Stress Concern Present  . Feeling of Stress : Not at all  Social Connections: Socially Isolated  . Frequency of Communication with Friends and Family: More than three times a week  . Frequency of Social Gatherings with Friends and Family: More than three times a week  . Attends Religious Services: Never  . Active Member of Clubs or Organizations: No  . Attends Archivist Meetings: Never  . Marital Status: Divorced    Tobacco Counseling Counseling given: Not Answered Comment: smoked 1ppd  for> 30 years/smokes occasionally,some vapor cigarettes   Clinical Intake:  Pre-visit preparation completed: Yes  Pain : No/denies pain     BMI - recorded: 26.18 Nutritional Status: BMI 25 -29 Overweight Nutritional Risks: None Diabetes: Yes CBG done?: No Did pt. bring in CBG monitor from home?: No  How often do you need to have someone help you when you read instructions, pamphlets, or other written materials  from your doctor or pharmacy?: 1 - Never  Diabetic?Yes  Interpreter Needed?: No  Information entered by :: [name], LPN   Activities of Daily Living In your present state of health, do you have any difficulty performing the following activities: 01/10/2020  Hearing? N  Vision? N  Difficulty concentrating or making decisions? N  Walking or climbing stairs? N  Dressing or bathing? N  Doing errands, shopping? N  Preparing Food and eating ? N  Using the Toilet? N  In the past six months, have you accidently leaked urine? N  Do you have problems with loss of bowel control? N  Managing your Medications? N  Managing your Finances? N  Housekeeping or managing your Housekeeping? N  Some recent data might be hidden    Patient Care Team: Caren Macadam, MD as PCP - General (Family Medicine) Rutherford Guys, MD as Consulting Physician (Ophthalmology) Earnie Larsson, [facility] as Pharmacist (Pharmacist)  Indicate any recent Medical Services you may have received from other than [facility] providers in the past year (date may be approximate).     Assessment:   This is a routine wellness examination for Andrea [name].  Hearing/Vision screen  Hearing Screening   125Hz  250Hz  500Hz  1000Hz  2000Hz  3000Hz  4000Hz  6000Hz  8000Hz   Right ear:           Left ear:           Comments: Pt denies any hearing difficulty at this time  Vision Screening Comments: Pt follows up with Dr Gershon Crane for annual eye exams  Dietary issues and exercise activities discussed: Current Exercise  Habits: Home exercise routine, Type of exercise: walking, Time (Minutes): 20, Frequency (Times/Week): 3, Weekly Exercise (Minutes/Week): 60  Goals    . Patient Stated     Not at this time    . Pharmacy Care Plan     CARE PLAN ENTRY  Current Barriers:  . Chronic Disease Management support, education, and care coordination needs related to  Hypertension, Diabetes, Hyperlipidemia/ coronary atherosclerosis, Vitamin B12 deficiency   Pharmacist Clinical Goal(s):  [name] Work with the care management team to address educational, disease management, and care coordination needs  . Call provider office for new or worsened signs and symptoms  . Continue self health monitoring activities as directed today  . Call care management team with questions or concerns.  . Diabetes:  [name] Maintain A1c < 7.0%. . Recent A1c: 5.9%  . Maintain up to date on diabetes preventative health (eye exams every 1 to 2 years and foot exams at least once yearly).  . Blood pressure:  [name] Maintain blood pressure within goal of your provider (130/80 or 140/90)  . Maintain low salt diet.  . Within the next month, obtain blood pressure monitor and continue self monitoring readings.  . Recent blood pressure reading: 130/58 (06/20/2019)  . High cholesterol/ coronary atherosclerosis . Cholesterol goals: Total Cholesterol goal under 200, Triglycerides goal under 150, HDL goal above 40 (men) or above 50 (women), LDL goal under 70.  [name] Recent cholesterol levels (06/20/2019) - Total cholesterol: 108 - Triglycerides: 271 - HDL: 29.60 - LDL: 51 . Vitamin B12 deficiency . Maintain Vitamin B12 level at or above 211.  Interventions: . Comprehensive medication review performed. . Diabetes . Discussed importance of diabetes preventative health exams. . Obtain diabetic eye exam every 1 to 2 years.  . Obtain at least once yearly foot exams. . Continue:  - glimepiride (Amaryl) 4mg , 1 tablet once daily before breakfast - metformin 1000mg , 1  tablet twice daily  - Hold: Januvia 100mg , 1 tablet once daily (hold for 3 months and follow up on 09/30/2019 for reassessment.  . Blood pressure:  . Discussed need to continue checking blood pressure at home.  . Discussed diet modifications. DASH diet:  following a diet emphasizing fruits and vegetables and low-fat dairy products along with whole grains, fish, poultry, and nuts. Reducing red meats and sugars.  . Exercising  . Reducing the amount of salt intake to 1500mg /per day.  . Recommend using a salt substitute to replace your salt if you need flavor.    . Continue:  - metoprolol tartrate (Lopressor) 50mg , 0.5 (one-half) tablet twice daily  - valsartan/ HCTZ 160/12.5mg , 1 tablet once daily  . High cholesterol/ coronary atherosclerosis (history of stents) . How to reduce cholesterol through diet/weight management and physical activity.    . We discussed how a diet high in plant sterols (fruits/vegetables/nuts/whole grains/legumes) may reduce your cholesterol.  Encouraged increasing fiber to a daily intake of 10-25g/day  . Continue:  - rosuvastatin (Crestor) 10mg , 1 tablet once daily  - aspirin 81mg , 1 tablet once daily . Vitamin B12 deficiency . Continue:  - vitamin B12 552mcg, 1 tablet once daily   Patient Self Care Activities:  . Self administers medications as prescribed and Calls provider office for new concerns or questions . Continue current medications as directed by providers.  . Continue following up with primary care provider and/or specialists. . Continue at home blood pressure readings. . Continue checking blood sugars.  . Continue working on health habits (diet/ exercise).  Initial goal documentation       Depression Screen PHQ 2/9 Scores 01/10/2020 12/21/2019 12/01/2018 03/09/2017 12/30/2016 05/25/2014 10/31/2013  PHQ - 2 Score 0 0 0 0 0 0 0    Fall Risk Fall Risk  01/10/2020 12/21/2019 05/25/2014 10/31/2013  Falls in the past year? 0 0 No No  Number falls in past yr:  0 0 - -  Injury with Fall? 0 0 - -  Follow up Falls prevention discussed - - -    Any stairs in or around the home? Yes  If so, are there any without handrails? No  Home free of loose throw rugs in walkways, pet beds, electrical cords, etc? Yes  Adequate lighting in your home to reduce risk of falls? Yes   ASSISTIVE DEVICES UTILIZED TO PREVENT FALLS:  Life alert? No  Use of a cane, walker or w/c? No  Grab bars in the bathroom? No  Shower chair or bench in shower? No  Elevated toilet seat or a handicapped toilet? No   TIMED UP AND GO:  Was the test performed? No .   Cognitive Function:     6CIT Screen 01/10/2020  What Year? 0 points  What month? 0 points  Count back from 20 0 points  Months in reverse 0 points  Repeat phrase 6 points    Immunizations Immunization History  Administered Date(s) Administered  . Fluad Quad(high Dose 65+) 12/21/2019  . Influenza Whole 01/12/1997, 01/05/2009  . Influenza,inj,Quad PF,6+ Mos 01/12/2015, 12/27/2015, 03/09/2017, 12/01/2018  . Influenza-Unspecified 02/12/2014, 01/19/2018  . PFIZER SARS-COV-2 Vaccination 07/07/2019, 08/01/2019  . Pneumococcal Conjugate-13 12/01/2018  . Pneumococcal Polysaccharide-23 04/24/2008, 01/31/2014, 12/21/2019  . Td 11/13/1997, 04/27/2009  . Zoster Recombinat (Shingrix) 11/12/2017, 01/12/2018    TDAP status: Due, Education has been provided regarding the importance of this vaccine. Advised may receive this vaccine at local pharmacy or Health Dept. Aware to provide a copy of the  vaccination record if obtained from local pharmacy or Health Dept. Verbalized acceptance and understanding. Flu Vaccine status: Up to date Pneumococcal vaccine status: Up to date Covid-19 vaccine status: Completed vaccines  Qualifies for Shingles Vaccine? Yes   Zostavax completed Yes   Shingrix Completed?: Yes  Screening Tests Health Maintenance  Topic Date Due  . MAMMOGRAM  04/23/2018  . DEXA SCAN  Never done  .  TETANUS/TDAP  04/28/2019  . OPHTHALMOLOGY EXAM  06/15/2020  . HEMOGLOBIN A1C  06/19/2020  . FOOT EXAM  12/20/2020  . COLONOSCOPY  04/30/2023  . INFLUENZA VACCINE  Completed  . COVID-19 Vaccine  Completed  . Hepatitis C Screening  Completed  . PNA vac Low Risk Adult  Completed    Health Maintenance  Health Maintenance Due  Topic Date Due  . MAMMOGRAM  04/23/2018  . DEXA SCAN  Never done  . TETANUS/TDAP  04/28/2019    Colorectal cancer screening: Completed 04/29/18. Repeat every 5 years Mammogram status: Completed 04/23/16. Repeat every year Bone Density status: Ordered 12/06/19 pt cancelled and will reschedule. Pt provided with contact info and advised to call to schedule appt.   Additional Screening:  Hepatitis C Screening:  Completed 11/23/14  Vision Screening: Recommended annual ophthalmology exams for early detection of glaucoma and other disorders of the eye. Is the patient up to date with their annual eye exam?  Yes  Who is the provider or what is the name of the office in which the patient attends annual eye exams? Dr Gershon Crane   Dental Screening: Recommended annual dental exams for proper oral hygiene  Community Resource Referral / Chronic Care Management: CRR required this visit?  No   CCM required this visit?  No      Plan:     I have personally reviewed and noted the following in the patient's chart:   . Medical and social history . Use of alcohol, tobacco or illicit drugs  . Current medications and supplements . Functional ability and status . Nutritional status . Physical activity . Advanced directives . List of other physicians . Hospitalizations, surgeries, and ER visits in previous 12 months . Vitals . Screenings to include cognitive, depression, and falls . Referrals and appointments  In addition, I have reviewed and discussed with patient certain preventive protocols, quality metrics, and best practice recommendations. A written personalized care  plan for preventive services as well as general preventive health recommendations were provided to patient.     Willette Brace, LPN   07/08/7122   Nurse Notes: None

## 2020-01-10 NOTE — Patient Instructions (Addendum)
Andrea [name] , Thank you for taking time to come for your Medicare Wellness Visit. I appreciate your ongoing commitment to your health goals. Please review the following plan we discussed and let me know if I can assist you in the future.   Screening recommendations/referrals: Colonoscopy: Done 04/29/18 Mammogram: Done 04/23/16 Bone Density: Scheduled , pt cancelled will reschedule at a later date Recommended yearly ophthalmology/optometry visit for glaucoma screening and checkup Recommended yearly dental visit for hygiene and checkup  Vaccinations: Influenza vaccine: Up to date Pneumococcal vaccine: Up to date Tdap vaccine: Due and discussed Shingles vaccine: Completed  11/12/17 & 01/12/18 Covid-19:Completed 07/07/19 & 08/01/19  Advanced directives: Advance directive discussed with you today. I have provided a copy for you to complete at home and have notarized. Once this is complete please bring a copy in to our office so we can scan it into your chart.   Conditions/risks identified: None at this time  Next appointment: Follow up in one year for your annual wellness visit     Preventive Care 65 Years and Older, Female Preventive care refers to lifestyle choices and visits with your health care provider that can promote health and wellness. What does preventive care include?  A yearly physical exam. This is also called an annual well check.  Dental exams once or twice a year.  Routine eye exams. Ask your health care provider how often you should have your eyes checked.  Personal lifestyle choices, including:  Daily care of your teeth and gums.  Regular physical activity.  Eating a healthy diet.  Avoiding tobacco and drug use.  Limiting alcohol use.  Practicing safe sex.  Taking low-dose aspirin every day.  Taking vitamin and mineral supplements as recommended by your health care provider. What happens during an annual well check? The services and screenings done by your  health care provider during your annual well check will depend on your age, overall health, lifestyle risk factors, and family history of disease. Counseling  Your health care provider may ask you questions about your:  Alcohol use.  Tobacco use.  Drug use.  Emotional well-being.  Home and relationship well-being.  Sexual activity.  Eating habits.  History of falls.  Memory and ability to understand (cognition).  Work and work Statistician.  Reproductive health. Screening  You may have the following tests or measurements:  Height, weight, and BMI.  Blood pressure.  Lipid and cholesterol levels. These may be checked every 5 years, or more frequently if you are over 28 years old.  Skin check.  Lung cancer screening. You may have this screening every year starting at age 71 if you have a 30-pack-year history of smoking and currently smoke or have quit within the past 15 years.  Fecal occult blood test (FOBT) of the stool. You may have this test every year starting at age 22.  Flexible sigmoidoscopy or colonoscopy. You may have a sigmoidoscopy every 5 years or a colonoscopy every 10 years starting at age 41.  Hepatitis C blood test.  Hepatitis B blood test.  Sexually transmitted disease (STD) testing.  Diabetes screening. This is done by checking your blood sugar (glucose) after you have not eaten for a while (fasting). You may have this done every 1-3 years.  Bone density scan. This is done to screen for osteoporosis. You may have this done starting at age 86.  Mammogram. This may be done every 1-2 years. Talk to your health care provider about how often you should have  regular mammograms. Talk with your health care provider about your test results, treatment options, and if necessary, the need for more tests. Vaccines  Your health care provider may recommend certain vaccines, such as:  Influenza vaccine. This is recommended every year.  Tetanus, diphtheria, and  acellular pertussis (Tdap, Td) vaccine. You may need a Td booster every 10 years.  Zoster vaccine. You may need this after age 75.  Pneumococcal 13-valent conjugate (PCV13) vaccine. One dose is recommended after age 12.  Pneumococcal polysaccharide (PPSV23) vaccine. One dose is recommended after age 50. Talk to your health care provider about which screenings and vaccines you need and how often you need them. This information is not intended to replace advice given to you by your health care provider. Make sure you discuss any questions you have with your health care provider. Document Released: 04/27/2015 Document Revised: 12/19/2015 Document Reviewed: 01/30/2015 Elsevier Interactive Patient Education  2017 [place] Prevention in the Home Falls can cause injuries. They can happen to people of all ages. There are many things you can do to make your home safe and to help prevent falls. What can I do on the outside of my home?  Regularly fix the edges of walkways and driveways and fix any cracks.  Remove anything that might make you trip as you walk through a door, such as a raised step or threshold.  Trim any bushes or trees on the path to your home.  Use bright outdoor lighting.  Clear any walking paths of anything that might make someone trip, such as rocks or tools.  Regularly check to see if handrails are loose or broken. Make sure that both sides of any steps have handrails.  Any raised decks and porches should have guardrails on the edges.  Have any leaves, snow, or ice cleared regularly.  Use sand or salt on walking paths during winter.  Clean up any spills in your garage right away. This includes oil or grease spills. What can I do in the bathroom?  Use night lights.  Install grab bars by the toilet and in the tub and shower. Do not use towel bars as grab bars.  Use non-skid mats or decals in the tub or shower.  If you need to sit down in the shower, use  a plastic, non-slip stool.  Keep the floor dry. Clean up any water that spills on the floor as soon as it happens.  Remove soap buildup in the tub or shower regularly.  Attach bath mats securely with double-sided non-slip rug tape.  Do not have throw rugs and other things on the floor that can make you trip. What can I do in the bedroom?  Use night lights.  Make sure that you have a light by your bed that is easy to reach.  Do not use any sheets or blankets that are too big for your bed. They should not hang down onto the floor.  Have a firm chair that has side arms. You can use this for support while you get dressed.  Do not have throw rugs and other things on the floor that can make you trip. What can I do in the kitchen?  Clean up any spills right away.  Avoid walking on wet floors.  Keep items that you use a lot in easy-to-reach places.  If you need to reach something above you, use a strong step stool that has a grab bar.  Keep electrical cords out of the  way.  Do not use floor polish or wax that makes floors slippery. If you must use wax, use non-skid floor wax.  Do not have throw rugs and other things on the floor that can make you trip. What can I do with my stairs?  Do not leave any items on the stairs.  Make sure that there are handrails on both sides of the stairs and use them. Fix handrails that are broken or loose. Make sure that handrails are as long as the stairways.  Check any carpeting to make sure that it is firmly attached to the stairs. Fix any carpet that is loose or worn.  Avoid having throw rugs at the top or bottom of the stairs. If you do have throw rugs, attach them to the floor with carpet tape.  Make sure that you have a light switch at the top of the stairs and the bottom of the stairs. If you do not have them, ask someone to add them for you. What else can I do to help prevent falls?  Wear shoes that:  Do not have high heels.  Have  rubber bottoms.  Are comfortable and fit you well.  Are closed at the toe. Do not wear sandals.  If you use a stepladder:  Make sure that it is fully opened. Do not climb a closed stepladder.  Make sure that both sides of the stepladder are locked into place.  Ask someone to hold it for you, if possible.  Clearly mark and make sure that you can see:  Any grab bars or handrails.  First and last steps.  Where the edge of each step is.  Use tools that help you move around (mobility aids) if they are needed. These include:  Canes.  Walkers.  Scooters.  Crutches.  Turn on the lights when you go into a dark area. Replace any light bulbs as soon as they burn out.  Set up your furniture so you have a clear path. Avoid moving your furniture around.  If any of your floors are uneven, fix them.  If there are any pets around you, be aware of where they are.  Review your medicines with your doctor. Some medicines can make you feel dizzy. This can increase your chance of falling. Ask your doctor what other things that you can do to help prevent falls. This information is not intended to replace advice given to you by your health care provider. Make sure you discuss any questions you have with your health care provider. Document Released: 01/25/2009 Document Revised: 09/06/2015 Document Reviewed: 05/05/2014 Elsevier Interactive Patient Education  2017 [employer].

## 2020-03-06 ENCOUNTER — Other Ambulatory Visit: Payer: Self-pay | Admitting: Family Medicine

## 2020-03-18 ENCOUNTER — Other Ambulatory Visit: Payer: Self-pay | Admitting: Family Medicine

## 2020-03-22 ENCOUNTER — Other Ambulatory Visit: Payer: Self-pay | Admitting: Family Medicine

## 2020-06-15 ENCOUNTER — Other Ambulatory Visit: Payer: Self-pay | Admitting: Family Medicine

## 2020-06-18 DIAGNOSIS — Z961 Presence of intraocular lens: Secondary | ICD-10-CM | POA: Diagnosis not present

## 2020-06-18 DIAGNOSIS — E119 Type 2 diabetes mellitus without complications: Secondary | ICD-10-CM | POA: Diagnosis not present

## 2020-06-18 LAB — HM DIABETES EYE EXAM

## 2020-06-20 DIAGNOSIS — Z1231 Encounter for screening mammogram for malignant neoplasm of breast: Secondary | ICD-10-CM | POA: Diagnosis not present

## 2020-06-20 DIAGNOSIS — Z13820 Encounter for screening for osteoporosis: Secondary | ICD-10-CM | POA: Diagnosis not present

## 2020-06-20 DIAGNOSIS — Z780 Asymptomatic menopausal state: Secondary | ICD-10-CM | POA: Diagnosis not present

## 2020-06-20 LAB — HM DEXA SCAN: HM Dexa Scan: NORMAL

## 2020-06-20 LAB — HM MAMMOGRAPHY

## 2020-06-27 ENCOUNTER — Encounter: Payer: Self-pay | Admitting: Family Medicine

## 2020-07-03 ENCOUNTER — Encounter: Payer: Self-pay | Admitting: Family Medicine

## 2020-07-16 ENCOUNTER — Telehealth: Payer: Self-pay | Admitting: Pharmacist

## 2020-07-16 NOTE — Chronic Care Management (AMB) (Signed)
I left the patient a message about her upcoming appointment on 07/17/2020 @ 10:00 am with the clinical pharmacist. She was asked to please have all medication on hand to review with the pharmacist.   Neita Goodnight) Mare Ferrari, [place] [phone]

## 2020-07-17 ENCOUNTER — Ambulatory Visit (INDEPENDENT_AMBULATORY_CARE_PROVIDER_SITE_OTHER): Payer: Medicare Other | Admitting: Pharmacist

## 2020-07-17 DIAGNOSIS — E1169 Type 2 diabetes mellitus with other specified complication: Secondary | ICD-10-CM

## 2020-07-17 DIAGNOSIS — E1140 Type 2 diabetes mellitus with diabetic neuropathy, unspecified: Secondary | ICD-10-CM

## 2020-07-17 DIAGNOSIS — I152 Hypertension secondary to endocrine disorders: Secondary | ICD-10-CM

## 2020-07-17 DIAGNOSIS — E785 Hyperlipidemia, unspecified: Secondary | ICD-10-CM

## 2020-07-17 DIAGNOSIS — E1159 Type 2 diabetes mellitus with other circulatory complications: Secondary | ICD-10-CM | POA: Diagnosis not present

## 2020-07-17 NOTE — Progress Notes (Signed)
Chronic Care Management Pharmacy Note  07/30/2020 Name:  VONA WHITERS MRN:  [id number] DOB:  [birth year]  Subjective: BRITTNE KAWASAKI is an 65 y.o. year old female who is a primary patient of Koberlein, Steele Berg, MD.  The CCM team was consulted for assistance with disease management and care coordination needs.    Engaged with patient by telephone for follow up visit in response to provider referral for pharmacy case management and/or care coordination services.   Consent to Services:  The patient was given information about Chronic Care Management services, agreed to services, and gave verbal consent prior to initiation of services.  Please see initial visit note for detailed documentation.   Patient Care Team: Caren Macadam, MD as PCP - General (Family Medicine) Rutherford Guys, MD as Consulting Physician (Ophthalmology) [name], [facility] as Pharmacist (Pharmacist)  Recent office visits: 01/10/20 [name], LPN: Patient presented for AWV.  12/21/19 [name], MD: Patient presented for annual exam.  Recent consult visits: 06/18/20 Rutherford Guys, MD ([facility]): Patient presented for diabetic eye exam.   Hospital visits: None in previous 6 months  Objective:  Lab Results  Component Value Date   CREATININE 0.97 12/21/2019   BUN 18 12/21/2019   GFR 53.62 (L) 06/20/2019   GFRNONAA >60 05/12/2018   GFRAA >60 05/12/2018   NA 138 12/21/2019   K 3.8 12/21/2019   CALCIUM 10.8 (H) 12/21/2019   CO2 25 12/21/2019   GLUCOSE 131 (H) 12/21/2019    Lab Results  Component Value Date/Time   HGBA1C 6.2 (H) 12/21/2019 08:54 AM   HGBA1C 5.9 06/20/2019 12:50 PM   GFR 53.62 (L) 06/20/2019 12:50 PM   GFR 72.99 09/17/2018 08:03 AM   MICROALBUR 1.5 12/21/2019 08:54 AM   MICROALBUR 0.8 06/20/2019 12:50 PM    Last diabetic Eye exam:  Lab Results  Component Value Date/Time   HMDIABEYEEXA No Retinopathy 06/18/2020 12:00 AM    Last diabetic Foot exam:  Lab  Results  Component Value Date/Time   HMDIABFOOTEX done 07/01/2009 12:00 AM     Lab Results  Component Value Date   CHOL 111 12/21/2019   HDL 31 (L) 12/21/2019   LDLCALC 46 12/21/2019   LDLDIRECT 51.0 06/20/2019   TRIG 321 (H) 12/21/2019   CHOLHDL 3.6 12/21/2019    Hepatic Function Latest Ref Rng & Units 12/21/2019 06/20/2019 10/24/2013  Total Protein 6.1 - 8.1 g/dL 6.9 6.8 7.3  Albumin 3.5 - 5.2 g/dL - 4.0 3.9  AST 10 - 35 U/L _0 ALT 6 - 29 U/L _1 Alk Phosphatase 39 - 117 U/L - 60 52  Total Bilirubin 0.2 - 1.2 mg/dL 0.2 0.3 0.3  Bilirubin, Direct 0.0 - 0.3 mg/dL - - 0.1    Lab Results  Component Value Date/Time   TSH 3.29 12/21/2019 08:54 AM   TSH 3.07 08/22/2016 10:13 AM    CBC Latest Ref Rng & Units 12/21/2019 06/20/2019 09/17/2018  WBC 3.8 - 10.8 Thousand/uL 7.4 6.9 6.7  Hemoglobin 11.7 - 15.5 g/dL 12.6 13.2 13.8  Hematocrit 35.0 - 45.0 % 37.2 37.9 39.1  Platelets 140 - 400 Thousand/uL 259 254.0 281.0    Lab Results  Component Value Date/Time   VD25OH 35 12/21/2019 08:54 AM    Clinical ASCVD: Yes  The ASCVD Risk score Mikey Bussing DC Jr., et al., 2013) failed to calculate for the following reasons:   The valid total cholesterol range is 130 to 320 mg/dL  Depression screen [facility] 2/9 01/10/2020 12/21/2019 12/01/2018  Decreased Interest 0 0 0  Down, Depressed, Hopeless 0 0 0  PHQ - 2 Score 0 0 0     Social History   Tobacco Use  Smoking Status Former Smoker  . Types: E-cigarettes  . Quit date: 06/20/2010  . Years since quitting: 10.1  Smokeless Tobacco Never Used  Tobacco Comment   smoked 1ppd for> 30 years/smokes occasionally,some vapor cigarettes   BP Readings from Last 3 Encounters:  12/21/19 116/60  06/20/19 (!) 130/58  12/01/18 136/64   Pulse Readings from Last 3 Encounters:  12/21/19 67  06/20/19 67  12/01/18 66   Wt Readings from Last 3 Encounters:  12/21/19 151 lb 4.8 oz (68.6 kg)  06/20/19 150 lb 1.6 oz (68.1 kg)  12/01/18 149 lb 1.6 oz (67.6  kg)   BMI Readings from Last 3 Encounters:  12/21/19 26.17 kg/m  06/20/19 26.59 kg/m  12/01/18 26.41 kg/m    Assessment/Interventions: Review of patient past medical history, allergies, medications, health status, including review of consultants reports, laboratory and other test data, was performed as part of comprehensive evaluation and provision of chronic care management services.   SDOH:  (Social Determinants of Health) assessments and interventions performed: No  SDOH Screenings   Alcohol Screen: Not on file  Depression (PHQ2-9): Low Risk   . PHQ-2 Score: 0  Financial Resource Strain: Low Risk   . Difficulty of Paying Living Expenses: Not hard at all  Food Insecurity: No Food Insecurity  . Worried About Charity fundraiser in the Last Year: Never true  . Ran Out of Food in the Last Year: Never true  Housing: Low Risk   . Last Housing Risk Score: 0  Physical Activity: Insufficiently Active  . Days of Exercise per Week: 3 days  . Minutes of Exercise per Session: 20 min  Social Connections: Socially Isolated  . Frequency of Communication with Friends and Family: More than three times a week  . Frequency of Social Gatherings with Friends and Family: More than three times a week  . Attends Religious Services: Never  . Active Member of Clubs or Organizations: No  . Attends Archivist Meetings: Never  . Marital Status: Divorced  Stress: No Stress Concern Present  . Feeling of Stress : Not at all  Tobacco Use: Medium Risk  . Smoking Tobacco Use: Former Smoker  . Smokeless Tobacco Use: Never Used  Transportation Needs: No Transportation Needs  . Lack of Transportation (Medical): No  . Lack of Transportation (Non-Medical): No    CCM Care Plan  Allergies  Allergen Reactions  . Plavix [Clopidogrel Bisulfate]     Caused platelets to drop    Medications Reviewed Today    Reviewed by Willette Brace, LPN (Licensed Practical Nurse) on 01/10/20 at [place]  List Status: <None>  Medication Order Taking? Sig Documenting Provider Last Dose Status Informant  aspirin 81 MG tablet [id number] Yes Take 81 mg by mouth daily.   [provider] Taking Active Self  cetirizine (ZYRTEC) 10 MG tablet [id number] Yes Take 10 mg by mouth daily. [provider] Taking Active Self  glimepiride (AMARYL) 4 MG tablet [id number] Yes TAKE 1 TABLET(4 MG) BY MOUTH DAILY BEFORE BREAKFAST Koberlein, Junell C, MD Taking Active   JANUVIA 100 MG tablet [id number] Yes TAKE 1 TABLET(100 MG) BY MOUTH DAILY Koberlein, Junell C, MD Taking Active   metFORMIN (GLUCOPHAGE) 1000 MG tablet [id number] Yes TAKE 1 TABLET BY  MOUTH TWICE DAILY AT 4AM AND 6 PM ON WORK DAYS AND 7-8AM AND 6PM ON NON WORK DAYS Caren Macadam, MD Taking Active   metoprolol tartrate (LOPRESSOR) 50 MG tablet [id number] Yes TAKE 1/2 TABLET BY MOUTH TWICE DAILY Caren Macadam, MD Taking Active   [facility] LANCETS FINE MISC [id number] Yes 1 Stick by Does not apply route daily. Ricard Dillon, MD Taking Active Self  [facility] ULTRA test strip [id number] Yes USE TO CHECK BLOOD SUGAR DAILY AND AS NEEDED Koberlein, Steele Berg, MD Taking Active   rosuvastatin (CRESTOR) 10 MG tablet [id number] Yes TAKE 1 TABLET(10 MG) BY MOUTH DAILY Koberlein, Junell C, MD Taking Active   valsartan-hydrochlorothiazide (DIOVAN-HCT) 160-12.5 MG tablet [id number] Yes TAKE 1 TABLET BY MOUTH DAILY Koberlein, Junell C, MD Taking Active   vitamin B-12 (CYANOCOBALAMIN) 500 MCG tablet [id number] Yes Take 500 mcg by mouth daily.  [provider] Taking Active Self          Patient Active Problem List   Diagnosis Date Noted  . Hyperlipidemia associated with type 2 diabetes mellitus ([place]) 09/09/2018  . B12 deficiency 09/09/2018  . Type 2 diabetes mellitus with diabetic neuropathy, without long-term current use of insulin ([place]) 05/24/2015  . Hx of adenomatous colonic polyps 08/09/2014  . Osteoarthritis 05/07/2007  .  Hypertension associated with diabetes ([place]) 11/19/2006  . Coronary atherosclerosis 11/09/2006    Immunization History  Administered Date(s) Administered  . Fluad Quad(high Dose 65+) 12/21/2019  . Influenza Whole 01/12/1997, 01/05/2009  . Influenza,inj,Quad PF,6+ Mos 01/12/2015, 12/27/2015, 03/09/2017, 12/01/2018  . Influenza-Unspecified 02/12/2014, 01/19/2018  . PFIZER(Purple Top)SARS-COV-2 Vaccination 07/07/2019, 08/01/2019  . Pneumococcal Conjugate-13 12/01/2018  . Pneumococcal Polysaccharide-23 04/24/2008, 01/31/2014, 12/21/2019  . Td 11/13/1997, 04/27/2009  . Zoster Recombinat (Shingrix) 11/12/2017, 01/12/2018   Patient reports she has been having trouble with heartburn lately and that Tums has been helping some but not enough. Discussedn on-pharmacologic management of symptoms such as elevating the head of your bed, avoiding eating 2-3 hours before bed, avoiding triggering foods such as acidic, spicy, or fatty foods, eating smaller meals, and wearing clothes that are loose around the waist. Patient reports she barely eats at night time and limits food before bed. She reports eating the same foods. Recommended eating smaller meals and trying Zantac or Pepcid over the counter if this doesn't help.   Conditions to be addressed/monitored:  Hypertension, Hyperlipidemia, Diabetes and vitamin B12 deficiency  Care Plan : CCM Pharmacy Care Plan  Updates made by [name], [place] since 07/30/2020 12:00 AM    Problem: Problem: Hypertension, Hyperlipidemia, Diabetes and vitamin B12 deficiency     Long-Range Goal: Patient-Specific Goal   Start Date: 07/17/2020  Expected End Date: 07/17/2021  This Visit's Progress: On track  Priority: High  Note:   Current Barriers:  . Unable to independently monitor therapeutic efficacy  Pharmacist Clinical Goal(s):  [name] Patient will achieve adherence to monitoring guidelines and medication adherence to achieve therapeutic efficacy through collaboration  with PharmD and provider.   Interventions: . 1:1 collaboration with Caren Macadam, MD regarding development and update of comprehensive plan of care as evidenced by provider attestation and co-signature . Inter-disciplinary care team collaboration (see longitudinal plan of care) . Comprehensive medication review performed; medication list updated in electronic medical record  Hypertension (BP goal <130/80) -Controlled -Current treatment:  metoprolol tartrate (Lopressor) 13m, 0.5 (one-half) tablet twice daily   valsartan/HCTZ 160/12.575m 1 tablet once daily  -Medications previously tried: [employer], water [employer]-Current home  readings: does not check regularly -Current dietary habits: only using salt when eating tomato sandwiches; does not use on any thing else -Current exercise habits: not walking consistently -Denies hypotensive/hypertensive symptoms -Educated on Exercise goal of 150 minutes per week; Importance of home blood pressure monitoring; -Counseled to monitor BP at home weekly, document, and provide log at future appointments -Counseled on diet and exercise extensively Recommended to continue current medication  Hyperlipidemia/coronary atherosclerosis: (LDL goal < 70) -Controlled -Current treatment:  rosuvastatin (Crestor) 33m, 1 tablet once daily   ASA 833m 1 tablet once daily -Medications previously tried: none  -Current dietary patterns: focusing on decreasing bread and soda -Current exercise habits: not consistently exercising -Educated on Cholesterol goals;  Exercise goal of 150 minutes per week; -Counseled on diet and exercise extensively Recommended to continue current medication  Diabetes (A1c goal <7%) -Controlled -Current medications:  glimepiride (Amaryl) 84m19m1 tablet once daily before breakfast  metformin 1000m18m tablet twice daily  Januvia 100 mg 1 tablet daily -Medications previously tried: Onglyza (cost)   -Current home  glucose readings . fasting glucose: 150-180s (usually every day) . post prandial glucose: none -Denies hypoglycemic/hyperglycemic symptoms -Current meal patterns:  . breakfast: did not discuss  . lunch: did not discuss  . dinner: did not discuss  . snacks: did not discuss  . drinks: did not discuss  -Current exercise: not exercising consistently -Educated on A1c and blood sugar goals; Exercise goal of 150 minutes per week; Benefits of routine self-monitoring of blood sugar; Carbohydrate counting and/or plate method -Counseled to check feet daily and get yearly eye exams -Counseled on diet and exercise extensively Recommended to continue current medication Assessed patient finances. Patient reports Januvia can be expensive in donut hole but able to afford right now.  Vitamin B12 deficiency (Goal: vitamin B12 211-911) -Controlled -Current treatment  . vitamin B12 500mc19m tablet once daily -Medications previously tried: none  -Recommended to continue current medication  Health Maintenance -Vaccine gaps: tetanus, COVID booster -Current therapy:  . Cetirizine 10 mg 1 tablet daily -Educated on Cost vs benefit of each product must be carefully weighed by individual consumer -Patient is satisfied with current therapy and denies issues -Recommended to continue current medication  Patient Goals/Self-Care Activities . Patient will:  - take medications as prescribed check glucose daily, document, and provide at future appointments check blood pressure weekly, document, and provide at future appointments  Follow Up Plan: Telephone follow up appointment with care management team member scheduled for: 6 months     Medication Assistance: None required.  Patient affirms current coverage meets needs.  [id number]: [phone] pill box? Yes - restocks on Wednesdays Pt endorses 100% compliance (sometime she might forget)  We discussed: Benefits of medication synchronization, packaging and delivery as well as enhanced pharmacist oversight with [facility]. Patient decided to: Utilize [facility] pharmacy for medication synchronization, packaging and delivery  Care Plan and Follow Up Patient Decision:  Patient agrees to Care Plan and Follow-up.  Plan: Telephone follow up appointment with care management team member scheduled for:  6 months  MadelJeni SallesrmD BCACPMonument Hillsmacist Le[place]auSpiveyrassLa [phone]

## 2020-07-30 NOTE — Patient Instructions (Addendum)
Hi Andrea [name],  It was great to get to meet you over the telephone! Don't forget to check your blood pressure at home once a week as we discussed and keep on with checking your blood sugars daily. Below is a summary of some of the topics we discussed.   Please reach out to me if you have any questions or need anything before our follow up!  Best, Maddie  [name], PharmD, [facility]  Visit Information  Goals Addressed   None    Patient Care Plan: CCM Pharmacy Care Plan    Problem Identified: Problem: Hypertension, Hyperlipidemia, Diabetes and vitamin B12 deficiency     Long-Range Goal: Patient-Specific Goal   Start Date: 07/17/2020  Expected End Date: 07/17/2021  This Visit's Progress: On track  Priority: High  Note:   Current Barriers:  . Unable to independently monitor therapeutic efficacy  Pharmacist Clinical Goal(s):  [name] Patient will achieve adherence to monitoring guidelines and medication adherence to achieve therapeutic efficacy through collaboration with PharmD and provider.   Interventions: . 1:1 collaboration with Caren Macadam, MD regarding development and update of comprehensive plan of care as evidenced by provider attestation and co-signature . Inter-disciplinary care team collaboration (see longitudinal plan of care) . Comprehensive medication review performed; medication list updated in electronic medical record  Hypertension (BP goal <130/80) -Controlled -Current treatment:  metoprolol tartrate (Lopressor) 50mg , 0.5 (one-half) tablet twice daily   valsartan/HCTZ 160/12.5mg , 1 tablet once daily  -Medications previously tried: Scientist, water quality [employer] formulary) -Current home readings: does not check regularly -Current dietary habits: only using salt when eating tomato sandwiches; does not use on any thing else -Current exercise habits: not walking consistently -Denies hypotensive/hypertensive  symptoms -Educated on Exercise goal of 150 minutes per week; Importance of home blood pressure monitoring; -Counseled to monitor BP at home weekly, document, and provide log at future appointments -Counseled on diet and exercise extensively Recommended to continue current medication  Hyperlipidemia/coronary atherosclerosis: (LDL goal < 70) -Controlled -Current treatment:  rosuvastatin (Crestor) 10mg , 1 tablet once daily   ASA 81mg , 1 tablet once daily -Medications previously tried: none  -Current dietary patterns: focusing on decreasing bread and soda -Current exercise habits: not consistently exercising -Educated on Cholesterol goals;  Exercise goal of 150 minutes per week; -Counseled on diet and exercise extensively Recommended to continue current medication  Diabetes (A1c goal <7%) -Controlled -Current medications:  glimepiride (Amaryl) 4mg , 1 tablet once daily before breakfast  metformin 1000mg , 1 tablet twice daily  Januvia 100 mg 1 tablet daily -Medications previously tried: Onglyza (cost)   -Current home glucose readings . fasting glucose: 150-180s (usually every day) . post prandial glucose: none -Denies hypoglycemic/hyperglycemic symptoms -Current meal patterns:  . breakfast: did not discuss  . lunch: did not discuss  . dinner: did not discuss  . snacks: did not discuss  . drinks: did not discuss  -Current exercise: not exercising consistently -Educated on A1c and blood sugar goals; Exercise goal of 150 minutes per week; Benefits of routine self-monitoring of blood sugar; Carbohydrate counting and/or plate method -Counseled to check feet daily and get yearly eye exams -Counseled on diet and exercise extensively Recommended to continue current medication Assessed patient finances. Patient reports Januvia can be expensive in donut hole but able to afford right now.  Vitamin B12 deficiency (Goal: vitamin B12 211-911) -Controlled -Current treatment   . vitamin B12 547mcg, 1 tablet once daily -Medications previously tried: none  -Recommended to continue current medication  Health Maintenance -Vaccine gaps: tetanus, COVID booster -Current therapy:  . Cetirizine 10 mg 1 tablet daily -Educated on Cost vs benefit of each product must be carefully weighed by individual consumer -Patient is satisfied with current therapy and denies issues -Recommended to continue current medication  Patient Goals/Self-Care Activities . Patient will:  - take medications as prescribed check glucose daily, document, and provide at future appointments check blood pressure weekly, document, and provide at future appointments  Follow Up Plan: Telephone follow up appointment with care management team member scheduled for: 6 months       Patient verbalizes understanding of instructions provided today and agrees to view in [place].  Telephone follow up appointment with pharmacy team member scheduled for: 6 months  [name], [facility]  How to Take Your Blood Pressure Blood pressure measures how strongly your blood is pressing against the walls of your arteries. Arteries are blood vessels that carry blood from your heart throughout your body. You can take your blood pressure at home with a machine. You may need to check your blood pressure at home:  To check if you have high blood pressure (hypertension).  To check your blood pressure over time.  To make sure your blood pressure medicine is working. Supplies needed:  Blood pressure machine, or monitor.  Dining room chair to sit in.  Table or desk.  Small notebook.  Pencil or pen. How to prepare Avoid these things for 30 minutes before checking your blood pressure:  Having drinks with caffeine in them, such as coffee or tea.  Drinking alcohol.  Eating.  Smoking.  Exercising. Do these things five minutes before checking your blood pressure:  Go to the bathroom and pee  (urinate).  Sit in a dining chair. Do not sit in a soft couch or an armchair.  Be quiet. Do not talk. How to take your blood pressure Follow the instructions that came with your machine. If you have a digital blood pressure monitor, these may be the instructions: 1. Sit up straight. 2. Place your feet on the floor. Do not cross your ankles or legs. 3. Rest your left arm at the level of your heart. You may rest it on a table, desk, or chair. 4. Pull up your shirt sleeve. 5. Wrap the blood pressure cuff around the upper part of your left arm. The cuff should be 1 inch (2.5 cm) above your elbow. It is best to wrap the cuff around bare skin. 6. Fit the cuff snugly around your arm. You should be able to place only one finger between the cuff and your arm. 7. Place the cord so that it rests in the bend of your elbow. 8. Press the power button. 9. Sit quietly while the cuff fills with air and loses air. 10. Write down the numbers on the screen. 11. Wait 2-3 minutes and then repeat steps 1-10.   What do the numbers mean? Two numbers make up your blood pressure. The first number is called systolic pressure. The second is called diastolic pressure. An example of a blood pressure reading is "120 over 80" (or 120/80). If you are an adult and do not have a medical condition, use this guide to find out if your blood pressure is normal: Normal  First number: below 120.  Second number: below 80. Elevated  First number: 120-129.  Second number: below 80. Hypertension stage 1  First number: 130-139.  Second number: 80-89. Hypertension stage 2  First number: 140 or  above.  Second number: 90 or above. Your blood pressure is above normal even if only the top or bottom number is above normal. Follow these instructions at home:  Check your blood pressure as often as your doctor tells you to.  Check your blood pressure at the same time every day.  Take your monitor to your next doctor's  appointment. Your doctor will: ? Make sure you are using it correctly. ? Make sure it is working right.  Make sure you understand what your blood pressure numbers should be.  Tell your doctor if your medicine is causing side effects.  Keep all follow-up visits as told by your doctor. This is important. General tips:  You will need a blood pressure machine, or monitor. Your doctor can suggest a monitor. You can buy one at a drugstore or online. When choosing one: ? Choose one with an arm cuff. ? Choose one that wraps around your upper arm. Only one finger should fit between your arm and the cuff. ? Do not choose one that measures your blood pressure from your wrist or finger. Where to find more information American Heart Association: www.heart.org Contact a doctor if:  Your blood pressure keeps being high. Get help right away if:  Your first blood pressure number is higher than 180.  Your second blood pressure number is higher than 120. Summary  Check your blood pressure at the same time every day.  Avoid caffeine, alcohol, smoking, and exercise for 30 minutes before checking your blood pressure.  Make sure you understand what your blood pressure numbers should be. This information is not intended to replace advice given to you by your health care provider. Make sure you discuss any questions you have with your health care provider. Document Revised: 03/25/2019 Document Reviewed: 03/25/2019 Elsevier Patient Education  2021 [employer].

## 2020-07-31 ENCOUNTER — Telehealth: Payer: Self-pay | Admitting: *Deleted

## 2020-07-31 MED ORDER — SITAGLIPTIN PHOSPHATE 100 MG PO TABS: ORAL_TABLET | ORAL | 1 refills | Status: DC

## 2020-07-31 MED ORDER — METFORMIN HCL 1000 MG PO TABS: ORAL_TABLET | ORAL | 1 refills | Status: DC

## 2020-07-31 MED ORDER — GLIMEPIRIDE 4 MG PO TABS: ORAL_TABLET | ORAL | 1 refills | Status: DC

## 2020-07-31 MED ORDER — VALSARTAN-HYDROCHLOROTHIAZIDE 160-12.5 MG PO TABS: 1.0000 | ORAL_TABLET | Freq: Every day | ORAL | 1 refills | Status: DC

## 2020-07-31 MED ORDER — METOPROLOL TARTRATE 50 MG PO TABS: 25.0000 mg | ORAL_TABLET | Freq: Two times a day (BID) | ORAL | 1 refills | Status: DC

## 2020-07-31 MED ORDER — ROSUVASTATIN CALCIUM 10 MG PO TABS: ORAL_TABLET | ORAL | 1 refills | Status: DC

## 2020-07-31 NOTE — Telephone Encounter (Signed)
Rx done. 

## 2020-07-31 NOTE — Telephone Encounter (Signed)
-----   Message from [name], [facility] sent at 07/30/2020 11:38 PM EDT ----- Regarding: Refills Hi!  Ms. Liles is switching to [facility]. Can you please send refills of the following to [facility]? -Januvia -glimepiride -metformin -metoprolol -valsartan-HCTZ -rosuvastatin  Thank you! Maddie

## 2020-09-13 ENCOUNTER — Other Ambulatory Visit: Payer: Self-pay | Admitting: Family Medicine

## 2020-09-14 ENCOUNTER — Other Ambulatory Visit: Payer: Self-pay | Admitting: Family Medicine

## 2020-09-28 ENCOUNTER — Telehealth: Payer: Self-pay | Admitting: Pharmacist

## 2020-09-28 NOTE — Chronic Care Management (AMB) (Addendum)
    Chronic Care Management Pharmacy Assistant   Name: Andrea [name]  MRN: [id number] DOB: 23-Sep-1955  Reason for Encounter: General Adherence Call  and acute medication request   Recent office visits:  None  Recent consult visits:  None  Hospital visits:  None in previous 6 months  Medications: Outpatient Encounter Medications as of 09/28/2020  Medication Sig   aspirin 81 MG tablet Take 81 mg by mouth daily.     cetirizine (ZYRTEC) 10 MG tablet Take 10 mg by mouth daily.   glimepiride (AMARYL) 4 MG tablet TAKE 1 TABLET(4 MG) BY MOUTH DAILY BEFORE BREAKFAST   metFORMIN (GLUCOPHAGE) 1000 MG tablet TAKE 1 TABLET BY MOUTH TWICE DAILY AT 4AM AND 6 PM ON WORK DAYS AND 7-8AM AND 6PM ON NON WORK DAYS   metoprolol tartrate (LOPRESSOR) 50 MG tablet TAKE 1/2 TABLET BY MOUTH TWICE DAILY   ONETOUCH DELICA LANCETS FINE MISC 1 Stick by Does not apply route daily.   ONETOUCH ULTRA test strip USE TO CHECK BLOOD SUGAR DAILY AND AS NEEDED   rosuvastatin (CRESTOR) 10 MG tablet TAKE 1 TABLET(10 MG) BY MOUTH DAILY   sitaGLIPtin (JANUVIA) 100 MG tablet TAKE 1 TABLET(100 MG) BY MOUTH DAILY   valsartan-hydrochlorothiazide (DIOVAN-HCT) 160-12.5 MG tablet Take 1 tablet by mouth daily.   vitamin B-12 (CYANOCOBALAMIN) 500 MCG tablet Take 500 mcg by mouth daily.    No facility-administered encounter medications on file as of 09/28/2020.   I spoke with the patient about medication adherence. She stated that she has been doing well. She did let me know the count of her medications. She states that she is not having any side effects from her current medication. She stated that she only had eight days of her metformin. An acute form was placed for this medication. There have been no hospital visits or urgent care visits since her last PCP or CPP visit. There has been no change in her diet or her exercise routine. she is part of [facility]. Her next CCM appointment is scheduled for October 2022.  Received  call from patient regarding medication management via [facility].  Patient requested an acute fill for the following medication to be delivered: 06.23.2022 Pharmacy needs refills? No Metformin (GLUCOPHAGE) 1000 mg: one tablet at breakfast and one tablet at dinner  Confirmed delivery date of 06.23.2022, advised patient that pharmacy will contact them the morning of delivery.  Star Rating Drugs: Medication Dispensed  Quantity Pharmacy  Rosuvastatin 10 mg  03.04.2022 90 [facility]  Januvia 100 mg 05.20.2022 56 [facility]  Valsartan/HCTZ 160 mg/12.5 mg 03.04.2022 90 [facility]  Glimepiride 4 mg 03.04.2022 90 [facility]  Metformin 1000 mg 03.04.2022 90 [facility]   Am[place] ([place]) Mare Ferrari, [place] Pharmacist Assistant [phone]

## 2020-10-18 ENCOUNTER — Telehealth: Payer: Self-pay | Admitting: Pharmacist

## 2020-10-18 NOTE — Chronic Care Management (AMB) (Signed)
Chronic Care Management Pharmacy Assistant   Name: Andrea [name]  MRN: [id number] DOB: [birth year]  Reason for Encounter: Medication Review/Medication Coordination Call    Recent office visits:   None  Recent consult visits:   None  Hospital visits:  None in previous 6 months  Reviewed chart for medication changes ahead of medication coordination call.  No OVs, Consults, or hospital visits since last care coordination call/Pharmacist visit.  No medication changes indicated   BP Readings from Last 3 Encounters:  12/21/19 116/60  06/20/19 (!) 130/58  12/01/18 136/64    Lab Results  Component Value Date   HGBA1C 6.2 (H) 12/21/2019     Patient obtains medications through Adherence Packaging  90 Days   Last adherence delivery included: Valsartan-hydrochlorothiazide 160/12.5 mg One tablet daily Glimepiride 4 mg One tablet daily before breakfast Metoprolol 50 mg One half tablet twice daily Patient did not decline any medications newly onboard  Patient is due for next adherence delivery on: 10-29-2020 10/18/2020- Called patient and reviewed medications, No answer left message to return call. 10/22/2020- Called patient to review medication,  no answer left message to return call. 10/31/2020- Patient called [facility] regarding medication delivery, Called patient back, reviewed medication and coordinated delivery.  This delivery to include: Valsartan-hydrochlorothiazide 160/12.5 mg One tablet daily (breakfast) Glimepiride 4 mg One tablet daily (before breakfast) Metoprolol 50 mg One half tablet twice daily (Breakfast and evening meal) Januvia 100 mg- 1 tablet daily (breakfast)   Metformin 1000 mg- 1 tablet twice daily (Breakfast and evening meal)   Rosuvastatin 10 mg- 1 tablet daily (evening meal)    No short fill or acute fill form needed.   Patient declined the following medications: None  Patient needs refills for None.  *Delivery date changed* Confirmed  delivery date of 11/01/2020, advised patient that pharmacy will contact them the morning of delivery.   Medications: Outpatient Encounter Medications as of 10/18/2020  Medication Sig   aspirin 81 MG tablet Take 81 mg by mouth daily.     cetirizine (ZYRTEC) 10 MG tablet Take 10 mg by mouth daily.   glimepiride (AMARYL) 4 MG tablet TAKE 1 TABLET(4 MG) BY MOUTH DAILY BEFORE BREAKFAST   metFORMIN (GLUCOPHAGE) 1000 MG tablet TAKE 1 TABLET BY MOUTH TWICE DAILY AT 4AM AND 6 PM ON WORK DAYS AND 7-8AM AND 6PM ON NON WORK DAYS   metoprolol tartrate (LOPRESSOR) 50 MG tablet TAKE 1/2 TABLET BY MOUTH TWICE DAILY   ONETOUCH DELICA LANCETS FINE MISC 1 Stick by Does not apply route daily.   ONETOUCH ULTRA test strip USE TO CHECK BLOOD SUGAR DAILY AND AS NEEDED   rosuvastatin (CRESTOR) 10 MG tablet TAKE 1 TABLET(10 MG) BY MOUTH DAILY   sitaGLIPtin (JANUVIA) 100 MG tablet TAKE 1 TABLET(100 MG) BY MOUTH DAILY   valsartan-hydrochlorothiazide (DIOVAN-HCT) 160-12.5 MG tablet Take 1 tablet by mouth daily.   vitamin B-12 (CYANOCOBALAMIN) 500 MCG tablet Take 500 mcg by mouth daily.    No facility-administered encounter medications on file as of 10/18/2020.    Care Gaps:  Tetanus vaccine OVERDUE TDAP vaccine OVERDUE COVID #3 booster OVERDUE HGB a1c DUE  Star Rating Drugs:  Glimepiride 4 mg last filled 10-12-2020 [facility] Metformin 1000 mg last filled 10-03-20 27 [facility] Rosuvastatin 10 mg 25 DS last filled  10-02-20 [facility] Valsartan-hydrochlorothiazide 160/12.5 mg 9 DS last filled 10-12-2020 [facility]  01-22-2021 at 10 am appointment with [name] CPP  SIG: Pattricia Boss, [place]  Clinical Pharmacist Assistant [phone]

## 2020-12-06 ENCOUNTER — Telehealth: Payer: Self-pay | Admitting: Pharmacist

## 2020-12-06 NOTE — Chronic Care Management (AMB) (Signed)
    Chronic Care Management Pharmacy Assistant   Name: Andrea [name]  MRN: [id number] DOB: 1955-06-23  Reason for Encounter: General Assessment Call    Recent office visits:  None  Recent consult visits:  None  Hospital visits:  None in previous 6 months  Medications: Outpatient Encounter Medications as of 12/06/2020  Medication Sig   aspirin 81 MG tablet Take 81 mg by mouth daily.     cetirizine (ZYRTEC) 10 MG tablet Take 10 mg by mouth daily.   glimepiride (AMARYL) 4 MG tablet TAKE 1 TABLET(4 MG) BY MOUTH DAILY BEFORE BREAKFAST   metFORMIN (GLUCOPHAGE) 1000 MG tablet TAKE 1 TABLET BY MOUTH TWICE DAILY AT 4AM AND 6 PM ON WORK DAYS AND 7-8AM AND 6PM ON NON WORK DAYS   metoprolol tartrate (LOPRESSOR) 50 MG tablet TAKE 1/2 TABLET BY MOUTH TWICE DAILY   ONETOUCH DELICA LANCETS FINE MISC 1 Stick by Does not apply route daily.   ONETOUCH ULTRA test strip USE TO CHECK BLOOD SUGAR DAILY AND AS NEEDED   rosuvastatin (CRESTOR) 10 MG tablet TAKE 1 TABLET(10 MG) BY MOUTH DAILY   sitaGLIPtin (JANUVIA) 100 MG tablet TAKE 1 TABLET(100 MG) BY MOUTH DAILY   valsartan-hydrochlorothiazide (DIOVAN-HCT) 160-12.5 MG tablet Take 1 tablet by mouth daily.   vitamin B-12 (CYANOCOBALAMIN) 500 MCG tablet Take 500 mcg by mouth daily.    No facility-administered encounter medications on file as of 12/06/2020.  Notes: Attempted to reach on 12-06-20 left vm Attempted to reach on 12-07-20 left vm Attempted to reach on 12-10-2020 no answer left several MSG's  Care Gaps: TDAP - Overdue COVID Vaccine #3 (Overdue) HGB A1C - Overdue Flu Vaccine - Overdue AWV 01-15-21 CCM F/U Call - 01-22-21 11 am  Star Rating Drugs: Glimepiride 4 mg last filled 11-01-2020 90 [facility] Metformin 1000 mg last filled 11-01-20 90 [facility] Rosuvastatin 10 mg 25 DS last filled 11-01-20 90 [facility] Valsartan-hydrochlorothiazide 160/12.5 mg 90 DS last filled 11-01-2020 at [place]  Pharmacist Assistant [phone]

## 2021-01-15 ENCOUNTER — Telehealth: Payer: Self-pay | Admitting: Pharmacist

## 2021-01-15 ENCOUNTER — Ambulatory Visit (INDEPENDENT_AMBULATORY_CARE_PROVIDER_SITE_OTHER): Payer: Medicare Other

## 2021-01-15 ENCOUNTER — Other Ambulatory Visit: Payer: Self-pay

## 2021-01-15 VITALS — BP 110/62 | HR 53 | Temp 98.0°F | Wt 153.8 lb

## 2021-01-15 DIAGNOSIS — Z0000 Encounter for general adult medical examination without abnormal findings: Secondary | ICD-10-CM

## 2021-01-15 NOTE — Chronic Care Management (AMB) (Signed)
Chronic Care Management Pharmacy Assistant   Name: Andrea [name]  MRN: [id number] DOB: 1956/02/12   Reason for Encounter: Disease State and Medication Review / Medication Coordination Call and Hypertension Assessment Call   Conditions to be addressed/monitored: HTN   Recent office visits:  None  Recent consult visits:  None  Hospital visits:  None in previous 6 months  Medications: Outpatient Encounter Medications as of 01/15/2021  Medication Sig   aspirin 81 MG tablet Take 81 mg by mouth daily.     cetirizine (ZYRTEC) 10 MG tablet Take 10 mg by mouth daily.   glimepiride (AMARYL) 4 MG tablet TAKE 1 TABLET(4 MG) BY MOUTH DAILY BEFORE BREAKFAST   metFORMIN (GLUCOPHAGE) 1000 MG tablet TAKE 1 TABLET BY MOUTH TWICE DAILY AT 4AM AND 6 PM ON WORK DAYS AND 7-8AM AND 6PM ON NON WORK DAYS   metoprolol tartrate (LOPRESSOR) 50 MG tablet TAKE 1/2 TABLET BY MOUTH TWICE DAILY   ONETOUCH DELICA LANCETS FINE MISC 1 Stick by Does not apply route daily.   ONETOUCH ULTRA test strip USE TO CHECK BLOOD SUGAR DAILY AND AS NEEDED   rosuvastatin (CRESTOR) 10 MG tablet TAKE 1 TABLET(10 MG) BY MOUTH DAILY   sitaGLIPtin (JANUVIA) 100 MG tablet TAKE 1 TABLET(100 MG) BY MOUTH DAILY   valsartan-hydrochlorothiazide (DIOVAN-HCT) 160-12.5 MG tablet Take 1 tablet by mouth daily.   vitamin B-12 (CYANOCOBALAMIN) 500 MCG tablet Take 500 mcg by mouth daily.    No facility-administered encounter medications on file as of 01/15/2021.    Fill History: glimepiride 4 mg tablet 11/01/2020 90   metoprolol tartrate 50 mg tablet 11/01/2020 90   rosuvastatin 10 mg tablet 11/01/2020 90   Januvia 100 mg tablet 11/01/2020 90   valsartan 160 mg-hydrochlorothiazide 12.5 mg tablet 11/01/2020 90   metformin 1,000 mg tablet 11/01/2020 90   Reviewed chart for medication changes ahead of medication coordination call.  No OVs, Consults, or hospital visits since last care coordination call/Pharmacist visit. (If  appropriate, list visit date, provider name)  No medication changes indicated OR if recent visit, treatment plan here.  BP Readings from Last 3 Encounters:  12/21/19 116/60  06/20/19 (!) 130/58  12/01/18 136/64    Lab Results  Component Value Date   HGBA1C 6.2 (H) 12/21/2019     Patient obtains medications through Adherence Packaging  90 Days   Last adherence delivery included: (medication name and frequency) Valsartan-hydrochlorothiazide 160/12.5 mg One tablet daily (breakfast) Glimepiride 4 mg One tablet daily (before breakfast) Metoprolol 50 mg One half tablet twice daily (Breakfast and evening meal) Januvia 100 mg- 1 tablet daily (breakfast)                  Metformin 1000 mg- 1 tablet twice daily (Breakfast and evening meal)   Patient declined (meds) last month due to PRN use/additional supply on hand. None  Patient is due for next adherence delivery on: 01/25/2021  Called patient and reviewed medications and coordinated delivery.  This delivery to include: Valsartan-hydrochlorothiazide 160/12.5 mg One tablet daily (breakfast) Glimepiride 4 mg One tablet daily (before breakfast) Metoprolol 50 mg One half tablet twice daily (Breakfast and evening meal) Januvia 100 mg- 1 tablet daily (breakfast)                  Metformin 1000 mg- 1 tablet twice daily (Breakfast and evening meal)  Rosuvastatin 10mg  - take 1 tablet daily  Coordinated acute fill: None needed  Patient needs refills for None.  Confirmed  delivery date of 01/25/2021, advised patient that pharmacy will contact them the morning of delivery.  Reviewed chart prior to disease state call. Spoke with patient regarding BP  Recent Office Vitals: BP Readings from Last 3 Encounters:  12/21/19 116/60  06/20/19 (!) 130/58  12/01/18 136/64   Pulse Readings from Last 3 Encounters:  12/21/19 67  06/20/19 67  12/01/18 66    Wt Readings from Last 3 Encounters:  12/21/19 151 lb 4.8 oz (68.6 kg)  06/20/19 150 lb  1.6 oz (68.1 kg)  12/01/18 149 lb 1.6 oz (67.6 kg)     Kidney Function Lab Results  Component Value Date/Time   CREATININE 0.97 12/21/2019 08:54 AM   CREATININE 1.03 06/20/2019 12:50 PM   CREATININE 0.79 09/17/2018 08:03 AM   GFR 53.62 (L) 06/20/2019 12:50 PM   GFRNONAA >60 05/12/2018 11:02 AM   GFRAA >60 05/12/2018 11:02 AM    BMP Latest Ref Rng & Units 12/21/2019 06/20/2019 09/17/2018  Glucose 65 - 99 mg/dL 131(H) 147(H) 138(H)  BUN 7 - 25 mg/dL 18 18 16   Creatinine 0.50 - 0.99 mg/dL 0.97 1.03 0.79  BUN/Creat Ratio 6 - 22 (calc) NOT APPLICABLE - -  Sodium 269 - 146 mmol/L 138 135 140  Potassium 3.5 - 5.3 mmol/L 3.8 4.1 4.3  Chloride 98 - 110 mmol/L 104 100 102  CO2 20 - 32 mmol/L 25 28 27   Calcium 8.6 - 10.4 mg/dL 10.8(H) 10.3 9.6    Current antihypertensive regimen:  Valsartan HCTZ 160/12.5 mg - take 1 tablet daily  How often are you checking your Blood Pressure?  Patient is checking twice a month  Current home BP readings: Patient thinks they have been in the 120/70 range.  What recent interventions/DTPs have been made by any provider to improve Blood Pressure control since last CPP Visit: None  Any recent hospitalizations or ED visits since last visit with CPP? No  What diet changes have been made to improve Blood Pressure Control?  Patient is not adding salt to her food.  She eats her meal out most of the time, she will have a salad for lunch and a sandwich for dinner.  What exercise is being done to improve your Blood Pressure Control?  Patient walks 3 times per week and does her own housework.  Adherence Review: Is the patient currently on ACE/ARB medication? Yes Does the patient have >5 day gap between last estimated fill dates? No  Care Gaps: AWV - scheduled today 01/15/2021 Last BP reading - 116/60 on 12/21/2019 Last HGA1C - 6.2 on 12/21/2019 Tetanus/TDAP - overdue Covid 19 booster 3 - overdue HGA1C - overdue Foot exam - overdue Flu - due  Star Rating  Drugs: Glimepiride 4 mg - last filled 11/01/2020 90[facility] at [facility] Metformin 1000 mg - last filled 11/01/2020 90[facility] at [facility] Rosuvastatin 10 mg - Januvia 100 mg - last filled 11/01/2020 90[facility] at [facility] Diovan-HCT 160/12.5 mg - last filled 11/01/2020 90DS at [place] [phone]

## 2021-01-15 NOTE — Patient Instructions (Signed)
[name] , Thank you for taking time to come for your Medicare Wellness Visit. I appreciate your ongoing commitment to your health goals. Please review the following plan we discussed and let me know if I can assist you in the future.   Screening recommendations/referrals: Colonoscopy: Done 04/29/18 repeat every 5 years due 04/30/23 Mammogram: done 06/20/20 repeat every year  Bone Density: Done 06/20/20 repeat every 2 years  Recommended yearly ophthalmology/optometry visit for glaucoma screening and checkup Recommended yearly dental visit for hygiene and checkup  Vaccinations: Influenza vaccine: Up to date Pneumococcal vaccine: Completed  Tdap vaccine: Due and discussed  Shingles vaccine: Completed 8/1 & 01/12/18   Covid-19:Completed 3/25 & 08/01/19  Advanced directives: Advance directive discussed with you today. I have provided a copy for you to complete at home and have notarized. Once this is complete please bring a copy in to our office so we can scan it into your chart.  Conditions/risks identified: None at this time   Next appointment: Follow up in one year for your annual wellness visit    Preventive Care 65 Years and Older, Female Preventive care refers to lifestyle choices and visits with your health care provider that can promote health and wellness. What does preventive care include? A yearly physical exam. This is also called an annual well check. Dental exams once or twice a year. Routine eye exams. Ask your health care provider how often you should have your eyes checked. Personal lifestyle choices, including: Daily care of your teeth and gums. Regular physical activity. Eating a healthy diet. Avoiding tobacco and drug use. Limiting alcohol use. Practicing safe sex. Taking low-dose aspirin every day. Taking vitamin and mineral supplements as recommended by your health care provider. What happens during an annual well check? The services and screenings done by your  health care provider during your annual well check will depend on your age, overall health, lifestyle risk factors, and family history of disease. Counseling  Your health care provider may ask you questions about your: Alcohol use. Tobacco use. Drug use. Emotional well-being. Home and relationship well-being. Sexual activity. Eating habits. History of falls. Memory and ability to understand (cognition). Work and work Statistician. Reproductive health. Screening  You may have the following tests or measurements: Height, weight, and BMI. Blood pressure. Lipid and cholesterol levels. These may be checked every 5 years, or more frequently if you are over 49 years old. Skin check. Lung cancer screening. You may have this screening every year starting at age 9 if you have a 30-pack-year history of smoking and currently smoke or have quit within the past 15 years. Fecal occult blood test (FOBT) of the stool. You may have this test every year starting at age 62. Flexible sigmoidoscopy or colonoscopy. You may have a sigmoidoscopy every 5 years or a colonoscopy every 10 years starting at age 32. Hepatitis C blood test. Hepatitis B blood test. Sexually transmitted disease (STD) testing. Diabetes screening. This is done by checking your blood sugar (glucose) after you have not eaten for a while (fasting). You may have this done every 1-3 years. Bone density scan. This is done to screen for osteoporosis. You may have this done starting at age 70. Mammogram. This may be done every 1-2 years. Talk to your health care provider about how often you should have regular mammograms. Talk with your health care provider about your test results, treatment options, and if necessary, the need for more tests. Vaccines  Your health care provider may  recommend certain vaccines, such as: Influenza vaccine. This is recommended every year. Tetanus, diphtheria, and acellular pertussis (Tdap, Td) vaccine. You may  need a Td booster every 10 years. Zoster vaccine. You may need this after age 50. Pneumococcal 13-valent conjugate (PCV13) vaccine. One dose is recommended after age 69. Pneumococcal polysaccharide (PPSV23) vaccine. One dose is recommended after age 67. Talk to your health care provider about which screenings and vaccines you need and how often you need them. This information is not intended to replace advice given to you by your health care provider. Make sure you discuss any questions you have with your health care provider. Document Released: 04/27/2015 Document Revised: 12/19/2015 Document Reviewed: 01/30/2015 Elsevier Interactive Patient Education  2017 [place] Prevention in the Home Falls can cause injuries. They can happen to people of all ages. There are many things you can do to make your home safe and to help prevent falls. What can I do on the outside of my home? Regularly fix the edges of walkways and driveways and fix any cracks. Remove anything that might make you trip as you walk through a door, such as a raised step or threshold. Trim any bushes or trees on the path to your home. Use bright outdoor lighting. Clear any walking paths of anything that might make someone trip, such as rocks or tools. Regularly check to see if handrails are loose or broken. Make sure that both sides of any steps have handrails. Any raised decks and porches should have guardrails on the edges. Have any leaves, snow, or ice cleared regularly. Use sand or salt on walking paths during winter. Clean up any spills in your garage right away. This includes oil or grease spills. What can I do in the bathroom? Use night lights. Install grab bars by the toilet and in the tub and shower. Do not use towel bars as grab bars. Use non-skid mats or decals in the tub or shower. If you need to sit down in the shower, use a plastic, non-slip stool. Keep the floor dry. Clean up any water that spills on  the floor as soon as it happens. Remove soap buildup in the tub or shower regularly. Attach bath mats securely with double-sided non-slip rug tape. Do not have throw rugs and other things on the floor that can make you trip. What can I do in the bedroom? Use night lights. Make sure that you have a light by your bed that is easy to reach. Do not use any sheets or blankets that are too big for your bed. They should not hang down onto the floor. Have a firm chair that has side arms. You can use this for support while you get dressed. Do not have throw rugs and other things on the floor that can make you trip. What can I do in the kitchen? Clean up any spills right away. Avoid walking on wet floors. Keep items that you use a lot in easy-to-reach places. If you need to reach something above you, use a strong step stool that has a grab bar. Keep electrical cords out of the way. Do not use floor polish or wax that makes floors slippery. If you must use wax, use non-skid floor wax. Do not have throw rugs and other things on the floor that can make you trip. What can I do with my stairs? Do not leave any items on the stairs. Make sure that there are handrails on both sides of  the stairs and use them. Fix handrails that are broken or loose. Make sure that handrails are as long as the stairways. Check any carpeting to make sure that it is firmly attached to the stairs. Fix any carpet that is loose or worn. Avoid having throw rugs at the top or bottom of the stairs. If you do have throw rugs, attach them to the floor with carpet tape. Make sure that you have a light switch at the top of the stairs and the bottom of the stairs. If you do not have them, ask someone to add them for you. What else can I do to help prevent falls? Wear shoes that: Do not have high heels. Have rubber bottoms. Are comfortable and fit you well. Are closed at the toe. Do not wear sandals. If you use a stepladder: Make sure  that it is fully opened. Do not climb a closed stepladder. Make sure that both sides of the stepladder are locked into place. Ask someone to hold it for you, if possible. Clearly mark and make sure that you can see: Any grab bars or handrails. First and last steps. Where the edge of each step is. Use tools that help you move around (mobility aids) if they are needed. These include: Canes. Walkers. Scooters. Crutches. Turn on the lights when you go into a dark area. Replace any light bulbs as soon as they burn out. Set up your furniture so you have a clear path. Avoid moving your furniture around. If any of your floors are uneven, fix them. If there are any pets around you, be aware of where they are. Review your medicines with your doctor. Some medicines can make you feel dizzy. This can increase your chance of falling. Ask your doctor what other things that you can do to help prevent falls. This information is not intended to replace advice given to you by your health care provider. Make sure you discuss any questions you have with your health care provider. Document Released: 01/25/2009 Document Revised: 09/06/2015 Document Reviewed: 05/05/2014 Elsevier Interactive Patient Education  2017 [employer].

## 2021-01-15 NOTE — Progress Notes (Addendum)
Subjective:   FRYDA MOLENDA is a 65 y.o. female who presents for Medicare Annual (Subsequent) preventive examination.  Review of Systems     Cardiac Risk Factors include: hypertension;diabetes mellitus;dyslipidemia     Objective:    Today's Vitals   01/15/21 0935  BP: 110/62  Pulse: (!) 53  Temp: 98 F (36.7 C)  SpO2: 97%  Weight: 153 lb 12.8 oz (69.8 kg)   Body mass index is 26.61 kg/m.  Advanced Directives 01/15/2021 01/10/2020 08/01/2014  Does Patient Have a Medical Advance Directive? No No No  Would patient like information on creating a medical advance directive? Yes (MAU/Ambulatory/Procedural Areas - Information given) Yes (MAU/Ambulatory/Procedural Areas - Information given) -    Current Medications (verified) Outpatient Encounter Medications as of 01/15/2021  Medication Sig   aspirin 81 MG tablet Take 81 mg by mouth daily.     cetirizine (ZYRTEC) 10 MG tablet Take 10 mg by mouth daily.   glimepiride (AMARYL) 4 MG tablet TAKE 1 TABLET(4 MG) BY MOUTH DAILY BEFORE BREAKFAST   metFORMIN (GLUCOPHAGE) 1000 MG tablet TAKE 1 TABLET BY MOUTH TWICE DAILY AT 4AM AND 6 PM ON WORK DAYS AND 7-8AM AND 6PM ON NON WORK DAYS   metoprolol tartrate (LOPRESSOR) 50 MG tablet TAKE 1/2 TABLET BY MOUTH TWICE DAILY   ONETOUCH DELICA LANCETS FINE MISC 1 Stick by Does not apply route daily.   ONETOUCH ULTRA test strip USE TO CHECK BLOOD SUGAR DAILY AND AS NEEDED   rosuvastatin (CRESTOR) 10 MG tablet TAKE 1 TABLET(10 MG) BY MOUTH DAILY   sitaGLIPtin (JANUVIA) 100 MG tablet TAKE 1 TABLET(100 MG) BY MOUTH DAILY   valsartan-hydrochlorothiazide (DIOVAN-HCT) 160-12.5 MG tablet Take 1 tablet by mouth daily.   vitamin B-12 (CYANOCOBALAMIN) 500 MCG tablet Take 500 mcg by mouth daily.    No facility-administered encounter medications on file as of 01/15/2021.    Allergies (verified) Plavix [clopidogrel bisulfate]   History: Past Medical History:  Diagnosis Date   Allergy    Anemia    Arthritis     B12 deficiency 09/09/2018   CAD (coronary artery disease)    has 3 stents   Cataract    removed both eyes    Diabetes mellitus    Hx of adenomatous colonic polyps 08/09/2014   Hyperlipidemia    Hypertension    Low back pain    Past Surgical History:  Procedure Laterality Date   BREAST LUMPECTOMY     carpal tunnel release both hands      CESAREAN SECTION     2 times   COLONOSCOPY     x 2- hx colon polyps-    EYE SURGERY     lens replaced/02/06/15 and 03/09/15   knee surgery for torn cartillige     right knee   LASIK  01/12/2014, 03/14/2014   POLYPECTOMY     rotator cuff surgery     right side   stents  2000   stents 2001  2001   3 stents   triger finger repair     both hands, right hand done twice   TUBAL LIGATION     Family History  Problem Relation Age of Onset   Heart disease Mother    Hypertension Mother    Heart disease Father    Heart disease Sister    CAD Brother        stenting   Other Sister        tumor on diaphragm; oxygen depdt   Pancreatic  cancer Paternal Uncle    Colitis Neg Hx    Colon cancer Neg Hx    Esophageal cancer Neg Hx    Rectal cancer Neg Hx    Stomach cancer Neg Hx    Social History   Socioeconomic History   Marital status: Divorced    Spouse name: Not on file   Number of children: Not on file   Years of education: Not on file   Highest education level: Not on file  Occupational History   Occupation: Retired  Tobacco Use   Smoking status: Former    Types: E-cigarettes    Quit date: 06/20/2010    Years since quitting: 10.5   Smokeless tobacco: Never   Tobacco comments:    smoked 1ppd for> 30 years/smokes occasionally,some vapor cigarettes  Substance and Sexual Activity   Alcohol use: No    Alcohol/week: 0.0 standard drinks   Drug use: No   Sexual activity: Yes  Other Topics Concern   Not on file  Social History Narrative   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      Home Situation: lives with son and grandson       Spiritual Beliefs: none      Lifestyle: no regular exercise; diet not great      Social Determinants of Health   Financial Resource Strain: Low Risk    Difficulty of Paying Living Expenses: Not hard at all  Food Insecurity: No Food Insecurity   Worried About Charity fundraiser in the Last Year: Never true   [place] in the Last Year: Never true  Transportation Needs: No Transportation Needs   Lack of Transportation (Medical): No   Lack of Transportation (Non-Medical): No  Physical Activity: Insufficiently Active   Days of Exercise per Week: 3 days   Minutes of Exercise per Session: 20 min  Stress: No Stress Concern Present   Feeling of Stress : Not at all  Social Connections: Socially Isolated   Frequency of Communication with Friends and Family: Three times a week   Frequency of Social Gatherings with Friends and Family: More than three times a week   Attends Religious Services: Never   Marine scientist or Organizations: No   Attends Music therapist: Never   Marital Status: Divorced    Tobacco Counseling Counseling given: Not Answered Tobacco comments: smoked 1ppd for> 30 years/smokes occasionally,some vapor cigarettes   Clinical Intake:  Pre-visit preparation completed: Yes  Pain : No/denies pain     BMI - recorded: 26 Nutritional Status: BMI 25 -29 Overweight Nutritional Risks: None Diabetes: Yes CBG done?: Yes (168) CBG resulted in Enter/ Edit results?: No Did pt. bring in CBG monitor from home?: No  How often do you need to have someone help you when you read instructions, pamphlets, or other written materials from your doctor or pharmacy?: 1 - Never  Diabetic?Nutrition Risk Assessment:  Has the patient had any N/V/D within the last 2 months?  No  Does the patient have any non-healing wounds?  No  Has the patient had any unintentional weight loss or weight gain?  No   Diabetes:  Is the patient diabetic?  Yes  If diabetic,  was a CBG obtained today?  Yes  Did the patient bring in their glucometer from home?  No  How often do you monitor your CBG's? Daily.   Financial Strains and Diabetes Management:  Are you having any financial strains with the device, your  supplies or your medication? No .  Does the patient want to be seen by Chronic Care Management for management of their diabetes?  No  Would the patient like to be referred to a Nutritionist or for Diabetic Management?  No   Diabetic Exams:  Diabetic Eye Exam: Completed 06/18/20 Diabetic Foot Exam: Completed 12/21/19 complete at next office visit   Interpreter Needed?: No  Information entered by :: [name], LPN   Activities of Daily Living In your present state of health, do you have any difficulty performing the following activities: 01/15/2021  Hearing? N  Vision? N  Difficulty concentrating or making decisions? N  Walking or climbing stairs? N  Dressing or bathing? N  Doing errands, shopping? N  Preparing Food and eating ? N  Using the Toilet? N  In the past six months, have you accidently leaked urine? N  Do you have problems with loss of bowel control? N  Managing your Medications? N  Managing your Finances? N  Housekeeping or managing your Housekeeping? N  Some recent data might be hidden    Patient Care Team: Caren Macadam, MD as PCP - General (Family Medicine) Rutherford Guys, MD as Consulting Physician (Ophthalmology) [name], [facility] as Pharmacist (Pharmacist)  Indicate any recent Medical Services you may have received from other than [facility] providers in the past year (date may be approximate).     Assessment:   This is a routine wellness examination for [place].  Hearing/Vision screen Hearing Screening - Comments:: Pt denies any hearing issues  Vision Screening - Comments:: Pt follow up with dr Gershon Crane for annual eye exams   Dietary issues and exercise activities discussed: Current Exercise Habits: Home  exercise routine, Type of exercise: walking, Time (Minutes): 25, Frequency (Times/Week): 3, Weekly Exercise (Minutes/Week): 75   Goals Addressed             This Visit's Progress    Patient Stated       None at this time       Depression Screen PHQ 2/9 Scores 01/15/2021 01/10/2020 12/21/2019 12/01/2018 03/09/2017 12/30/2016 05/25/2014  PHQ - 2 Score 0 0 0 0 0 0 0    Fall Risk Fall Risk  01/15/2021 01/10/2020 12/21/2019 05/25/2014 10/31/2013  Falls in the past year? 0 0 0 No No  Number falls in past yr: 0 0 0 - -  Injury with Fall? 0 0 0 - -  Risk for fall due to : Impaired vision - - - -  Follow up Falls prevention discussed Falls prevention discussed - - -    FALL RISK PREVENTION PERTAINING TO THE HOME:  Any stairs in or around the home? Yes  If so, are there any without handrails? No  Home free of loose throw rugs in walkways, pet beds, electrical cords, etc? Yes  Adequate lighting in your home to reduce risk of falls? Yes   ASSISTIVE DEVICES UTILIZED TO PREVENT FALLS:  Life alert? No  Use of a cane, walker or w/c? No  Grab bars in the bathroom? No  Shower chair or bench in shower? No  Elevated toilet seat or a handicapped toilet? No   TIMED UP AND GO:  Was the test performed? No .  Cognitive Function:     6CIT Screen 01/15/2021 01/10/2020  What Year? 0 points 0 points  What month? 0 points 0 points  What time? 0 points -  Count back from 20 0 points 0 points  Months in reverse 0  points 0 points  Repeat phrase 0 points 6 points  Total Score 0 -    Immunizations Immunization History  Administered Date(s) Administered   Fluad Quad(high Dose 65+) 12/21/2019   Influenza Whole 01/12/1997, 01/05/2009   Influenza,inj,Quad PF,6+ Mos 01/12/2015, 12/27/2015, 03/09/2017, 12/01/2018   Influenza-Unspecified 02/12/2014, 01/19/2018   PFIZER(Purple Top)SARS-COV-2 Vaccination 07/07/2019, 08/01/2019   Pneumococcal Conjugate-13 12/01/2018   Pneumococcal Polysaccharide-23  04/24/2008, 01/31/2014, 12/21/2019   Td 11/13/1997, 04/27/2009   Zoster Recombinat (Shingrix) 11/12/2017, 01/12/2018    TDAP status: Due, Education has been provided regarding the importance of this vaccine. Advised may receive this vaccine at local pharmacy or Health Dept. Aware to provide a copy of the vaccination record if obtained from local pharmacy or Health Dept. Verbalized acceptance and understanding.  Flu Vaccine status: Up to date  Pneumococcal vaccine status: Up to date  Covid-19 vaccine status: Completed vaccines  Qualifies for Shingles Vaccine? Yes   Zostavax completed Yes   Shingrix Completed?: Yes  Screening Tests Health Maintenance  Topic Date Due   TETANUS/TDAP  04/28/2019   COVID-19 Vaccine (3 - Booster for Pfizer series) 01/01/2020   HEMOGLOBIN A1C  06/19/2020   INFLUENZA VACCINE  11/12/2020   FOOT EXAM  12/20/2020   OPHTHALMOLOGY EXAM  06/18/2021   MAMMOGRAM  06/21/2022   COLONOSCOPY (Pts 45-17yrs Insurance coverage will need to be confirmed)  04/30/2023   DEXA SCAN  Completed   Hepatitis C Screening  Completed   Zoster Vaccines- Shingrix  Completed   HPV VACCINES  Aged Out    Health Maintenance  Health Maintenance Due  Topic Date Due   TETANUS/TDAP  04/28/2019   COVID-19 Vaccine (3 - Booster for Pfizer series) 01/01/2020   HEMOGLOBIN A1C  06/19/2020   INFLUENZA VACCINE  11/12/2020   FOOT EXAM  12/20/2020    Colorectal cancer screening: Type of screening: Colonoscopy. Completed 04/29/18. Repeat every 5 years  Mammogram status: Completed 06/20/20. Repeat every year  Bone Density status: Completed 06/20/20. Results reflect: Bone density results: NORMAL. Repeat every 2 years.  Additional Screening:  Hepatitis C Screening: Completed 11/23/14  Vision Screening: Recommended annual ophthalmology exams for early detection of glaucoma and other disorders of the eye. Is the patient up to date with their annual eye exam?  Yes  Who is the provider or what  is the name of the office in which the patient attends annual eye exams? Dr Gershon Crane If pt is not established with a provider, would they like to be referred to a provider to establish care? No .   Dental Screening: Recommended annual dental exams for proper oral hygiene  Community Resource Referral / Chronic Care Management: CRR required this visit?  No   CCM required this visit?  No      Plan:     I have personally reviewed and noted the following in the patient's chart:   Medical and social history Use of alcohol, tobacco or illicit drugs  Current medications and supplements including opioid prescriptions.  Functional ability and status Nutritional status Physical activity Advanced directives List of other physicians Hospitalizations, surgeries, and ER visits in previous 12 months Vitals Screenings to include cognitive, depression, and falls Referrals and appointments  In addition, I have reviewed and discussed with patient certain preventive protocols, quality metrics, and best practice recommendations. A written personalized care plan for preventive services as well as general preventive health recommendations were provided to patient.     Willette Brace, LPN   [id number]   Nurse  Notes: None

## 2021-01-20 ENCOUNTER — Other Ambulatory Visit: Payer: Self-pay | Admitting: Family Medicine

## 2021-01-21 ENCOUNTER — Telehealth: Payer: Self-pay | Admitting: Pharmacist

## 2021-01-21 NOTE — Chronic Care Management (AMB) (Signed)
    Chronic Care Management Pharmacy Assistant   Name: Andrea [name]  MRN: [id number] DOB: 08-12-1955  01-21-2021 APPOINTMENT REMINDER   Called Andrea [name], No answer, left message of appointment on 01-22-21 at 10 via telephone visit with [name], Pharm D. Notified to have all medications, supplements, blood pressure and/or blood sugar logs available during appointment and to return call if need to reschedule.  Questions: Have you had any recent office visit or specialist visit outside of [place]? Patient has not according to chart notes  Are you having any problems obtaining your medications? (Whether it pharmacy issues or cost) Patient uses [facility]  Care Gaps: AWV 01-15-21 A1C - 6.2 (12-2019) BP - 110/62 TDAP - Overdue COVID Booster #3 (Pfizer) - Overdue HGB A1C - Overdue Flu Vaccine - Overdue Foot Exam - Overdue  Star Rating Drug: Glimepiride (Amaryl) 4 mg - Last filled 11-01-2020 90 DS at [facility] Metformin (Glucophage) 1000 mg - Last filled 11-01-2020 90 [facility] at [facility] Rosuvastatin (Crestor) 10 mg - Last filled 11-01-2020 90 [facility] at [facility] Sitagliptin (Januvia) 100 mg - Last filled 11-01-2020 90 [facility] at [facility] Valsartan- HCTZ (Diovan HCT) 160-12.5 mg - Last filled 11-01-2020 90 DS at [facility]  Any gaps in medications fill history? None  Medications: Outpatient Encounter Medications as of 01/21/2021  Medication Sig   aspirin 81 MG tablet Take 81 mg by mouth daily.     cetirizine (ZYRTEC) 10 MG tablet Take 10 mg by mouth daily.   glimepiride (AMARYL) 4 MG tablet TAKE 1 TABLET(4 MG) BY MOUTH DAILY BEFORE BREAKFAST   metFORMIN (GLUCOPHAGE) 1000 MG tablet TAKE 1 TABLET BY MOUTH TWICE DAILY AT 4AM AND 6 PM ON WORK DAYS AND 7-8AM AND 6PM ON NON WORK DAYS   metoprolol tartrate (LOPRESSOR) 50 MG tablet TAKE 1/2 TABLET BY MOUTH TWICE DAILY   ONETOUCH DELICA LANCETS FINE MISC 1 Stick by Does not apply route daily.   ONETOUCH ULTRA test strip USE TO  CHECK BLOOD SUGAR DAILY AND AS NEEDED   rosuvastatin (CRESTOR) 10 MG tablet TAKE 1 TABLET(10 MG) BY MOUTH DAILY   sitaGLIPtin (JANUVIA) 100 MG tablet TAKE 1 TABLET(100 MG) BY MOUTH DAILY   valsartan-hydrochlorothiazide (DIOVAN-HCT) 160-12.5 MG tablet Take 1 tablet by mouth daily.   vitamin B-12 (CYANOCOBALAMIN) 500 MCG tablet Take 500 mcg by mouth daily.    No facility-administered encounter medications on file as of 01/21/2021.    [place] Clinical Pharmacist Assistant [phone]

## 2021-01-22 ENCOUNTER — Ambulatory Visit (INDEPENDENT_AMBULATORY_CARE_PROVIDER_SITE_OTHER): Payer: Medicare Other | Admitting: Pharmacist

## 2021-01-22 DIAGNOSIS — I152 Hypertension secondary to endocrine disorders: Secondary | ICD-10-CM

## 2021-01-22 DIAGNOSIS — E1140 Type 2 diabetes mellitus with diabetic neuropathy, unspecified: Secondary | ICD-10-CM

## 2021-01-22 NOTE — Progress Notes (Signed)
Chronic Care Management Pharmacy Note  01/23/2021 Name:  Andrea [name] MRN:  [id number] DOB:  12/30/1955  Summary: A1c at goal < 7% but recent home readings are elevated   Recommendations/Changes made from today's visit: -Recommend d/c glimepiride and Januvia to switch to Ozempic or Rybelsus for ASCVD benefits and better blood sugar lowering   Plan: Follow up after PCP appointment to assess any medication changes  Subjective: Andrea [name] is an 65 y.o. year old female who is a primary patient of Koberlein, Steele Berg, MD.  The CCM team was consulted for assistance with disease management and care coordination needs.    Engaged with patient by telephone for follow up visit in response to provider referral for pharmacy case management and/or care coordination services.   Consent to Services:  The patient was given information about Chronic Care Management services, agreed to services, and gave verbal consent prior to initiation of services.  Please see initial visit note for detailed documentation.   Patient Care Team: Caren Macadam, MD as PCP - General (Family Medicine) Rutherford Guys, MD as Consulting Physician (Ophthalmology) [name], [facility] as Pharmacist (Pharmacist)  Recent office visits: 01/15/21 Andrea Rakes, LPN: Patient presented for AWV.  12/21/19 Andrea Rough, MD: Patient presented for annual exam.  Recent consult visits: 06/18/20 Rutherford Guys, MD ([facility]): Patient presented for diabetic eye exam.   Hospital visits: None in previous 6 months  Objective:  Lab Results  Component Value Date   CREATININE 0.97 12/21/2019   BUN 18 12/21/2019   GFR 53.62 (L) 06/20/2019   GFRNONAA >60 05/12/2018   GFRAA >60 05/12/2018   NA 138 12/21/2019   K 3.8 12/21/2019   CALCIUM 10.8 (H) 12/21/2019   CO2 25 12/21/2019   GLUCOSE 131 (H) 12/21/2019    Lab Results  Component Value Date/Time   HGBA1C 6.2 (H) 12/21/2019 08:54 AM   HGBA1C 5.9  06/20/2019 12:50 PM   GFR 53.62 (L) 06/20/2019 12:50 PM   GFR 72.99 09/17/2018 08:03 AM   MICROALBUR 1.5 12/21/2019 08:54 AM   MICROALBUR 0.8 06/20/2019 12:50 PM    Last diabetic Eye exam:  Lab Results  Component Value Date/Time   HMDIABEYEEXA No Retinopathy 06/18/2020 12:00 AM    Last diabetic Foot exam:  Lab Results  Component Value Date/Time   HMDIABFOOTEX done 07/01/2009 12:00 AM     Lab Results  Component Value Date   CHOL 111 12/21/2019   HDL 31 (L) 12/21/2019   LDLCALC 46 12/21/2019   LDLDIRECT 51.0 06/20/2019   TRIG 321 (H) 12/21/2019   CHOLHDL 3.6 12/21/2019    Hepatic Function Latest Ref Rng & Units 12/21/2019 06/20/2019 10/24/2013  Total Protein 6.1 - 8.1 g/dL 6.9 6.8 7.3  Albumin 3.5 - 5.2 g/dL - 4.0 3.9  AST 10 - 35 U/L 19 10 18   ALT 6 - 29 U/L 8 8 12   Alk Phosphatase 39 - 117 U/L - 60 52  Total Bilirubin 0.2 - 1.2 mg/dL 0.2 0.3 0.3  Bilirubin, Direct 0.0 - 0.3 mg/dL - - 0.1    Lab Results  Component Value Date/Time   TSH 3.29 12/21/2019 08:54 AM   TSH 3.07 08/22/2016 10:13 AM    CBC Latest Ref Rng & Units 12/21/2019 06/20/2019 09/17/2018  WBC 3.8 - 10.8 Thousand/uL 7.4 6.9 6.7  Hemoglobin 11.7 - 15.5 g/dL 12.6 13.2 13.8  Hematocrit 35.0 - 45.0 % 37.2 37.9 39.1  Platelets 140 - 400 Thousand/uL 259 254.0 281.0  Lab Results  Component Value Date/Time   VD25OH 35 12/21/2019 08:54 AM    Clinical ASCVD: Yes  The ASCVD Risk score (Arnett DK, et al., 2019) failed to calculate for the following reasons:   The valid total cholesterol range is 130 to 320 mg/dL    Depression screen [facility] 2/9 01/15/2021 01/10/2020 12/21/2019  Decreased Interest 0 0 0  Down, Depressed, Hopeless 0 0 0  PHQ - 2 Score 0 0 0     Social History   Tobacco Use  Smoking Status Former   Types: E-cigarettes   Quit date: 06/20/2010   Years since quitting: 10.6  Smokeless Tobacco Never  Tobacco Comments   smoked 1ppd for> 30 years/smokes occasionally,some vapor cigarettes   BP Readings  from Last 3 Encounters:  01/15/21 110/62  12/21/19 116/60  06/20/19 (!) 130/58   Pulse Readings from Last 3 Encounters:  01/15/21 (!) 53  12/21/19 67  06/20/19 67   Wt Readings from Last 3 Encounters:  01/15/21 153 lb 12.8 oz (69.8 kg)  12/21/19 151 lb 4.8 oz (68.6 kg)  06/20/19 150 lb 1.6 oz (68.1 kg)   BMI Readings from Last 3 Encounters:  01/15/21 26.61 kg/m  12/21/19 26.17 kg/m  06/20/19 26.59 kg/m    Assessment/Interventions: Review of patient past medical history, allergies, medications, health status, including review of consultants reports, laboratory and other test data, was performed as part of comprehensive evaluation and provision of chronic care management services.   SDOH:  (Social Determinants of Health) assessments and interventions performed: No  SDOH Screenings   Alcohol Screen: Not on file  Depression (PHQ2-9): Low Risk    PHQ-2 Score: 0  Financial Resource Strain: Low Risk    Difficulty of Paying Living Expenses: Not hard at all  Food Insecurity: No Food Insecurity   Worried About Charity fundraiser in the Last Year: Never true   Ran Out of Food in the Last Year: Never true  Housing: Low Risk    Last Housing Risk Score: 0  Physical Activity: Insufficiently Active   Days of Exercise per Week: 3 days   Minutes of Exercise per Session: 20 min  Social Connections: Socially Isolated   Frequency of Communication with Friends and Family: Three times a week   Frequency of Social Gatherings with Friends and Family: More than three times a week   Attends Religious Services: Never   Marine scientist or Organizations: No   Attends Music therapist: Never   Marital Status: Divorced  Stress: No Stress Concern Present   Feeling of Stress : Not at all  Tobacco Use: Medium Risk   Smoking Tobacco Use: Former   Smokeless Tobacco Use: Never  Transportation Needs: No Data processing manager (Medical): No   Lack of  Transportation (Non-Medical): No    CCM Care Plan  Allergies  Allergen Reactions   Plavix [Clopidogrel Bisulfate]     Caused platelets to drop    Medications Reviewed Today     Reviewed by Willette Brace, LPN (Licensed Practical Nurse) on 01/15/21 at [phone]  Med List Status: <None>   Medication Order Taking? Sig Documenting Provider Last Dose Status Informant  aspirin 81 MG tablet [id number] Yes Take 81 mg by mouth daily.   [provider] Taking Active Self  cetirizine (ZYRTEC) 10 MG tablet [id number] Yes Take 10 mg by mouth daily. [provider] Taking Active Self  glimepiride (AMARYL) 4 MG tablet [id number] Yes TAKE  1 TABLET(4 MG) BY MOUTH DAILY BEFORE BREAKFAST Koberlein, Junell C, MD Taking Active   metFORMIN (GLUCOPHAGE) 1000 MG tablet [id number] Yes TAKE 1 TABLET BY MOUTH TWICE DAILY AT 4AM AND 6 PM ON WORK DAYS AND 7-8AM AND 6PM ON NON WORK DAYS Caren Macadam, MD Taking Active   metoprolol tartrate (LOPRESSOR) 50 MG tablet [id number] Yes TAKE 1/2 TABLET BY MOUTH TWICE DAILY Caren Macadam, MD Taking Active   [facility] LANCETS FINE MISC [id number] Yes 1 Stick by Does not apply route daily. Ricard Dillon, MD Taking Active Self  [facility] ULTRA test strip [id number] Yes USE TO CHECK BLOOD SUGAR DAILY AND AS NEEDED Lucretia Kern, DO Taking Active   rosuvastatin (CRESTOR) 10 MG tablet [id number] Yes TAKE 1 TABLET(10 MG) BY MOUTH DAILY Koberlein, Junell C, MD Taking Active   sitaGLIPtin (JANUVIA) 100 MG tablet [id number] Yes TAKE 1 TABLET(100 MG) BY MOUTH DAILY Koberlein, Junell C, MD Taking Active   valsartan-hydrochlorothiazide (DIOVAN-HCT) 160-12.5 MG tablet [id number] Yes Take 1 tablet by mouth daily. Caren Macadam, MD Taking Active   vitamin B-12 (CYANOCOBALAMIN) 500 MCG tablet [id number] Yes Take 500 mcg by mouth daily.  [provider] Taking Active Self            Patient Active Problem List   Diagnosis Date Noted   Hyperlipidemia  associated with type 2 diabetes mellitus ([place]) 09/09/2018   B12 deficiency 09/09/2018   Type 2 diabetes mellitus with diabetic neuropathy, without long-term current use of insulin ([place]) 05/24/2015   Hx of adenomatous colonic polyps 08/09/2014   Osteoarthritis 05/07/2007   Hypertension associated with diabetes ([place]) 11/19/2006   Coronary atherosclerosis 11/09/2006    Immunization History  Administered Date(s) Administered   Fluad Quad(high Dose 65+) 12/21/2019   Influenza Whole 01/12/1997, 01/05/2009   Influenza,inj,Quad PF,6+ Mos 01/12/2015, 12/27/2015, 03/09/2017, 12/01/2018   Influenza-Unspecified 02/12/2014, 01/19/2018   PFIZER(Purple Top)SARS-COV-2 Vaccination 07/07/2019, 08/01/2019   Pneumococcal Conjugate-13 12/01/2018   Pneumococcal Polysaccharide-23 04/24/2008, 01/31/2014, 12/21/2019   Td 11/13/1997, 04/27/2009   Zoster Recombinat (Shingrix) 11/12/2017, 01/12/2018   Patient reports she is still having issues with indigestion and did try OTC options without relief. She plans to discuss further with her PCP at her office visit.  Patient reports she sometimes sleeps well and sometimes doesn't. She gets up to go to the bathroom during the night and sometimes it takes a long time to fall asleep. She used to work third shift for a lot time and thinks this may contribute. Educated on practicing good sleep hygiene by setting a sleep schedule and maintaining it, avoid excessive napping, following a nightly routine, avoiding screen time for 30-60 minutes before going to bed, and making the bedroom a cool, quiet and dark space. Recommended limiting her 2 hour nap to 1 hour and drinking more water.   Conditions to be addressed/monitored:  Hypertension, Hyperlipidemia, Diabetes and vitamin B12 deficiency  Conditions addressed this visit: Diabetes, hypertension  Care Plan : CCM Pharmacy Care Plan  Updates made by [name], [place] since 01/23/2021 12:00 AM     Problem: Problem:  Hypertension, Hyperlipidemia, Diabetes and vitamin B12 deficiency      Long-Range Goal: Patient-Specific Goal   Start Date: 07/17/2020  Expected End Date: 07/17/2021  Recent Progress: On track  Priority: High  Note:   Current Barriers:  Unable to independently monitor therapeutic efficacy  Pharmacist Clinical Goal(s):  Patient will achieve adherence to monitoring guidelines and medication adherence to achieve therapeutic efficacy through  collaboration with PharmD and provider.   Interventions: 1:1 collaboration with Caren Macadam, MD regarding development and update of comprehensive plan of care as evidenced by provider attestation and co-signature Inter-disciplinary care team collaboration (see longitudinal plan of care) Comprehensive medication review performed; medication list updated in electronic medical record  Hypertension (BP goal <130/80) -Controlled -Current treatment: metoprolol tartrate (Lopressor) 58m, 0.5 (one-half) tablet twice daily  valsartan/HCTZ 160/12.585m 1 tablet once daily  -Medications previously tried: [employer]ist, water quality[employer]ulary) -Current home readings: 120/70, 110/62 -Current dietary habits: only using salt when eating tomato sandwiches; does not use on any thing else -Current exercise habits: not walking consistently -Denies hypotensive/hypertensive symptoms -Educated on Exercise goal of 150 minutes per week; Importance of home blood pressure monitoring; -Counseled to monitor BP at home weekly, document, and provide log at future appointments -Counseled on diet and exercise extensively Recommended to continue current medication  Hyperlipidemia/coronary atherosclerosis: (LDL goal < 70) -Controlled -Current treatment: rosuvastatin (Crestor) 1063m1 tablet once daily  ASA 32m71m tablet once daily -Medications previously tried: none  -Current dietary patterns: focusing on decreasing bread and soda -Current exercise habits: not consistently  exercising -Educated on Cholesterol goals;  Exercise goal of 150 minutes per week; -Counseled on diet and exercise extensively Recommended to continue current medication  Diabetes (A1c goal <7%) -Controlled -Current medications: glimepiride (Amaryl) 4mg,16mtablet once daily before breakfast metformin 1000mg,14mablet twice daily Januvia 100 mg 1 tablet daily -Medications previously tried: Onglyza (cost)   -Current home glucose readings fasting glucose: 170-180s (mornings) post prandial glucose: none -Denies hypoglycemic/hyperglycemic symptoms -Current meal patterns:  breakfast: eggs, grits & bacon and toast lunch: did not discuss  dinner: [place]; cereal snacks: not many desserts, peanut butter crackers drinks: 1/2 can of soda and juice, not much water -Current exercise: not exercising consistently; was walking 3 times a week for about 20 minutes but is having more knee pain/popping -Educated on A1c and blood sugar goals; Exercise goal of 150 minutes per week; Benefits of routine self-monitoring of blood sugar; Carbohydrate counting and/or plate method -Counseled to check feet daily and get yearly eye exams -Counseled on diet and exercise extensively Recommended switching glimepiride and Januvia to Ozempic or Rybelsus for ASCVD benefits and better blood sugar lowering.  Vitamin B12 deficiency (Goal: vitamin B12 211-911) -Controlled -Current treatment  vitamin B12 500mcg,69mablet once daily -Medications previously tried: none  -Recommended to continue current medication  Health Maintenance -Vaccine gaps: tetanus, COVID booster -Current therapy:  Cetirizine 10 mg 1 tablet daily -Educated on Cost vs benefit of each product must be carefully weighed by individual consumer -Patient is satisfied with current therapy and denies issues -Recommended to continue current medication  Patient Goals/Self-Care Activities Patient will:  - take medications as  prescribed check glucose daily, document, and provide at future appointments check blood pressure weekly, document, and provide at future appointments  Follow Up Plan: Telephone follow up appointment with care management team member scheduled for: The care management team will reach out to the patient again over the next 30 days.        Medication Assistance: None required.  Patient affirms current coverage meets needs.  Compliance/Adherence/Medication fill history: Care Gaps: AWV 01-15-21 A1C - 6.2 (12-2019) BP - 110/62 TDAP - Overdue COVID Booster #3 (Pfizer) - Overdue HGB A1C - Overdue Flu Vaccine - Overdue Foot Exam - Overdue   Star-Rating Drugs: Glimepiride (Amaryl) 4 mg - Last filled 11-01-2020 [facility] at [facility] Metformin (  Glucophage) 1000 mg - Last filled 11-01-2020 [facility] at [facility] Rosuvastatin (Crestor) 10 mg - Last filled 11-01-2020 [facility] at [facility] Sitagliptin (Januvia) 100 mg - Last filled 11-01-2020 90 DS at [facility] Valsartan- HCTZ (Diovan HCT) 160-12.5 mg - Last filled 11-01-2020 90 DS at [facility]  Patient's preferred pharmacy is:  [facility] [place], [place] [place] [address] Phone: [phone] Fax: [phone]  [facility] - [place], [place] - [address] [place] [address] Phone: [phone] Fax: [phone]  Uses pill box? Yes - restocks on Wednesdays Pt endorses 100% compliance (sometime she might forget)  We discussed: Benefits of medication synchronization, packaging and delivery as well as enhanced pharmacist oversight with [facility]. Patient decided to: Utilize [facility] for medication synchronization, packaging and delivery  Care Plan and Follow Up Patient Decision:  Patient agrees to Care Plan and Follow-up.  Plan: Telephone follow up appointment with care management team  member scheduled for:  6 months  [name], PharmD [place] Pharmacist [place] [facility] [place] [phone]

## 2021-01-23 NOTE — Patient Instructions (Signed)
Hi Lile[place],  It was great to get to meet you over the telephone! Below is a summary of some of the topics we discussed.   Please reach out to me if you have any questions or need anything before our follow up!  Best, Maddie  [name], PharmD, [place] [facility] [place] [phone]   Visit Information   Goals Addressed   None    Patient Care Plan: CCM Pharmacy Care Plan     Problem Identified: Problem: Hypertension, Hyperlipidemia, Diabetes and vitamin B12 deficiency      Long-Range Goal: Patient-Specific Goal   Start Date: 07/17/2020  Expected End Date: 07/17/2021  Recent Progress: On track  Priority: High  Note:   Current Barriers:  Unable to independently monitor therapeutic efficacy  Pharmacist Clinical Goal(s):  Patient will achieve adherence to monitoring guidelines and medication adherence to achieve therapeutic efficacy through collaboration with PharmD and provider.   Interventions: 1:1 collaboration with Caren Macadam, MD regarding development and update of comprehensive plan of care as evidenced by provider attestation and co-signature Inter-disciplinary care team collaboration (see longitudinal plan of care) Comprehensive medication review performed; medication list updated in electronic medical record  Hypertension (BP goal <130/80) -Controlled -Current treatment: metoprolol tartrate (Lopressor) 50mg , 0.5 (one-half) tablet twice daily  valsartan/HCTZ 160/12.5mg , 1 tablet once daily  -Medications previously tried: Scientist, water quality [employer] formulary) -Current home readings: 120/70, 110/62 -Current dietary habits: only using salt when eating tomato sandwiches; does not use on any thing else -Current exercise habits: not walking consistently -Denies hypotensive/hypertensive symptoms -Educated on Exercise goal of 150 minutes per week; Importance of home blood pressure monitoring; -Counseled to monitor BP at home weekly,  document, and provide log at future appointments -Counseled on diet and exercise extensively Recommended to continue current medication  Hyperlipidemia/coronary atherosclerosis: (LDL goal < 70) -Controlled -Current treatment: rosuvastatin (Crestor) 10mg , 1 tablet once daily  ASA 81mg , 1 tablet once daily -Medications previously tried: none  -Current dietary patterns: focusing on decreasing bread and soda -Current exercise habits: not consistently exercising -Educated on Cholesterol goals;  Exercise goal of 150 minutes per week; -Counseled on diet and exercise extensively Recommended to continue current medication  Diabetes (A1c goal <7%) -Controlled -Current medications: glimepiride (Amaryl) 4mg , 1 tablet once daily before breakfast metformin 1000mg , 1 tablet twice daily Januvia 100 mg 1 tablet daily -Medications previously tried: Onglyza (cost)   -Current home glucose readings fasting glucose: 170-180s (mornings) post prandial glucose: none -Denies hypoglycemic/hyperglycemic symptoms -Current meal patterns:  breakfast: eggs, grits & bacon and toast lunch: did not discuss  dinner: [place] last night; cereal snacks: not many desserts, peanut butter crackers drinks: 1/2 can of soda and juice, not much water -Current exercise: not exercising consistently; was walking 3 times a week for about 20 minutes but is having more knee pain/popping -Educated on A1c and blood sugar goals; Exercise goal of 150 minutes per week; Benefits of routine self-monitoring of blood sugar; Carbohydrate counting and/or plate method -Counseled to check feet daily and get yearly eye exams -Counseled on diet and exercise extensively Recommended switching glimepiride and Januvia to Ozempic or Rybelsus for ASCVD benefits and better blood sugar lowering.  Vitamin B12 deficiency (Goal: vitamin B12 211-911) -Controlled -Current treatment  vitamin B12 532mcg, 1 tablet once daily -Medications  previously tried: none  -Recommended to continue current medication  Health Maintenance -Vaccine gaps: tetanus, COVID booster -Current therapy:  Cetirizine 10 mg 1 tablet daily -Educated on Cost vs benefit of each product  must be carefully weighed by individual consumer -Patient is satisfied with current therapy and denies issues -Recommended to continue current medication  Patient Goals/Self-Care Activities Patient will:  - take medications as prescribed check glucose daily, document, and provide at future appointments check blood pressure weekly, document, and provide at future appointments  Follow Up Plan: Telephone follow up appointment with care management team member scheduled for: The care management team will reach out to the patient again over the next 30 days.         Patient verbalizes understanding of instructions provided today and agrees to view in [place].  The pharmacy team will reach out to the patient again over the next 30 days.   [name], [facility]

## 2021-02-01 ENCOUNTER — Ambulatory Visit: Payer: Medicare Other | Admitting: Family Medicine

## 2021-02-08 ENCOUNTER — Ambulatory Visit (INDEPENDENT_AMBULATORY_CARE_PROVIDER_SITE_OTHER): Payer: Medicare Other | Admitting: Family Medicine

## 2021-02-08 ENCOUNTER — Other Ambulatory Visit: Payer: Self-pay

## 2021-02-08 ENCOUNTER — Encounter: Payer: Self-pay | Admitting: Family Medicine

## 2021-02-08 VITALS — BP 130/60 | HR 65 | Temp 98.2°F | Ht 63.0 in | Wt 154.2 lb

## 2021-02-08 DIAGNOSIS — E785 Hyperlipidemia, unspecified: Secondary | ICD-10-CM | POA: Diagnosis not present

## 2021-02-08 DIAGNOSIS — K219 Gastro-esophageal reflux disease without esophagitis: Secondary | ICD-10-CM | POA: Diagnosis not present

## 2021-02-08 DIAGNOSIS — E1169 Type 2 diabetes mellitus with other specified complication: Secondary | ICD-10-CM

## 2021-02-08 DIAGNOSIS — E1140 Type 2 diabetes mellitus with diabetic neuropathy, unspecified: Secondary | ICD-10-CM

## 2021-02-08 DIAGNOSIS — I152 Hypertension secondary to endocrine disorders: Secondary | ICD-10-CM

## 2021-02-08 DIAGNOSIS — E1159 Type 2 diabetes mellitus with other circulatory complications: Secondary | ICD-10-CM

## 2021-02-08 DIAGNOSIS — Z0000 Encounter for general adult medical examination without abnormal findings: Secondary | ICD-10-CM | POA: Diagnosis not present

## 2021-02-08 DIAGNOSIS — E538 Deficiency of other specified B group vitamins: Secondary | ICD-10-CM

## 2021-02-08 DIAGNOSIS — I10 Essential (primary) hypertension: Secondary | ICD-10-CM | POA: Diagnosis not present

## 2021-02-08 DIAGNOSIS — B351 Tinea unguium: Secondary | ICD-10-CM

## 2021-02-08 LAB — TSH: TSH: 3.35 u[IU]/mL (ref 0.35–5.50)

## 2021-02-08 LAB — CBC WITH DIFFERENTIAL/PLATELET
Basophils Absolute: 0.1 10*3/uL (ref 0.0–0.1)
Basophils Relative: 1.4 % (ref 0.0–3.0)
Eosinophils Absolute: 0.4 10*3/uL (ref 0.0–0.7)
Eosinophils Relative: 6.1 % — ABNORMAL HIGH (ref 0.0–5.0)
HCT: 37 % (ref 36.0–46.0)
Hemoglobin: 12.6 g/dL (ref 12.0–15.0)
Lymphocytes Relative: 20 % (ref 12.0–46.0)
Lymphs Abs: 1.2 10*3/uL (ref 0.7–4.0)
MCHC: 33.9 g/dL (ref 30.0–36.0)
MCV: 94 fl (ref 78.0–100.0)
Monocytes Absolute: 0.4 10*3/uL (ref 0.1–1.0)
Monocytes Relative: 5.7 % (ref 3.0–12.0)
Neutro Abs: 4.1 10*3/uL (ref 1.4–7.7)
Neutrophils Relative %: 66.8 % (ref 43.0–77.0)
Platelets: 265 10*3/uL (ref 150.0–400.0)
RBC: 3.94 Mil/uL (ref 3.87–5.11)
RDW: 14.4 % (ref 11.5–15.5)
WBC: 6.2 10*3/uL (ref 4.0–10.5)

## 2021-02-08 LAB — MICROALBUMIN / CREATININE URINE RATIO
Creatinine,U: 126.9 mg/dL
Microalb Creat Ratio: 1.7 mg/g (ref 0.0–30.0)
Microalb, Ur: 2.2 mg/dL — ABNORMAL HIGH (ref 0.0–1.9)

## 2021-02-08 LAB — COMPREHENSIVE METABOLIC PANEL
ALT: 8 U/L (ref 0–35)
AST: 13 U/L (ref 0–37)
Albumin: 4.3 g/dL (ref 3.5–5.2)
Alkaline Phosphatase: 47 U/L (ref 39–117)
BUN: 17 mg/dL (ref 6–23)
CO2: 29 mEq/L (ref 19–32)
Calcium: 9.7 mg/dL (ref 8.4–10.5)
Chloride: 98 mEq/L (ref 96–112)
Creatinine, Ser: 1.05 mg/dL (ref 0.40–1.20)
GFR: 54.94 mL/min — ABNORMAL LOW (ref >60.00)
Glucose, Bld: 237 mg/dL — ABNORMAL HIGH (ref 70–99)
Potassium: 3.6 mEq/L (ref 3.5–5.1)
Sodium: 139 mEq/L (ref 135–145)
Total Bilirubin: 0.4 mg/dL (ref 0.2–1.2)
Total Protein: 7.4 g/dL (ref 6.0–8.3)

## 2021-02-08 LAB — LIPID PANEL
Cholesterol: 114 mg/dL (ref 0–200)
HDL: 29.4 mg/dL — ABNORMAL LOW (ref >39.00)
NonHDL: 84.89
Total CHOL/HDL Ratio: 4
Triglycerides: 264 mg/dL — ABNORMAL HIGH (ref 0.0–149.0)
VLDL: 52.8 mg/dL — ABNORMAL HIGH (ref 0.0–40.0)

## 2021-02-08 LAB — HEMOGLOBIN A1C: Hgb A1c MFr Bld: 7 % — ABNORMAL HIGH (ref 4.6–6.5)

## 2021-02-08 LAB — LDL CHOLESTEROL, DIRECT: Direct LDL: 56 mg/dL

## 2021-02-08 LAB — VITAMIN B12: Vitamin B-12: 735 pg/mL (ref 211–911)

## 2021-02-08 LAB — FOLATE: Folate: 5.4 ng/mL — ABNORMAL LOW (ref >5.9)

## 2021-02-08 MED ORDER — PANTOPRAZOLE SODIUM 40 MG PO TBEC: 40.0000 mg | DELAYED_RELEASE_TABLET | Freq: Every day | ORAL | 1 refills | Status: DC

## 2021-02-08 NOTE — Progress Notes (Signed)
Andrea [name] DOB: [birth year] Encounter date: 02/08/2021  This is a 65 y.o. female who presents for complete physical   History of present illness/Additional concerns: Indigestion is concern. Taking a lot of tums but this isn't helping. 4 at a time about 3 times/day. Tried pepcid, but that didn't work either. Always been issue, but worse as she is getting older. Of note, calcium levels were high last year likely from tums. Feels need to burp. Bothers her at night too.   Sugars have been up and down. Some in 140's. High 178. She is still taking the metformin, glimepiride, and januvia. She does have flex spending account she can use through work to help with med purchases if we were to change up medications.   Stress levels are doing okay.  Her grandson is now living in [place] with his other grandparents, son is no longer living with her.  Last colonoscopy 04/29/2018: repeat in 5 years due to polyp Mammogram: completed 06/20/20: normal DEXA: completed 06/20/20:normal Last pap 2016; due for one final repeat.   Past Medical History:  Diagnosis Date   Allergy    Anemia    Arthritis    B12 deficiency 09/09/2018   CAD (coronary artery disease)    has 3 stents   Cataract    removed both eyes    Diabetes mellitus    Hx of adenomatous colonic polyps 08/09/2014   Hyperlipidemia    Hypertension    Low back pain    Past Surgical History:  Procedure Laterality Date   BREAST LUMPECTOMY     carpal tunnel release both hands      CESAREAN SECTION     2 times   COLONOSCOPY     x 2- hx colon polyps-    EYE SURGERY     lens replaced/02/06/15 and 03/09/15   knee surgery for torn cartillige     right knee   LASIK  01/12/2014, 03/14/2014   POLYPECTOMY     rotator cuff surgery     right side   stents  2000   stents 2001  2001   3 stents   triger finger repair     both hands, right hand done twice   TUBAL LIGATION     Allergies  Allergen Reactions   Plavix [Clopidogrel Bisulfate]     Caused  platelets to drop   Current Meds  Medication Sig   aspirin 81 MG tablet Take 81 mg by mouth daily.     cetirizine (ZYRTEC) 10 MG tablet Take 10 mg by mouth daily.   glimepiride (AMARYL) 4 MG tablet TAKE ONE TABLET BY MOUTH BEFORE BREAKFAST   metFORMIN (GLUCOPHAGE) 1000 MG tablet TAKE ONE TABLET BY MOUTH TWICE DAILY AT 4am AND 6pm ON work DAYS, 7-8am AND 6pm ON non work DAYS   metoprolol tartrate (LOPRESSOR) 50 MG tablet TAKE 1/2 TABLET BY MOUTH TWICE DAILY   ONETOUCH DELICA LANCETS FINE MISC 1 Stick by Does not apply route daily.   ONETOUCH ULTRA test strip USE TO CHECK BLOOD SUGAR DAILY AND AS NEEDED   pantoprazole (PROTONIX) 40 MG tablet Take 1 tablet (40 mg total) by mouth daily.   rosuvastatin (CRESTOR) 10 MG tablet TAKE ONE TABLET BY MOUTH EVERY EVENING   sitaGLIPtin (JANUVIA) 100 MG tablet TAKE ONE TABLET BY MOUTH ONCE DAILY   valsartan-hydrochlorothiazide (DIOVAN-HCT) 160-12.5 MG tablet TAKE ONE TABLET BY MOUTH ONCE DAILY   vitamin B-12 (CYANOCOBALAMIN) 500 MCG tablet Take 500 mcg by mouth daily.  Social History   Tobacco Use   Smoking status: Former    Types: E-cigarettes    Quit date: 06/20/2010    Years since quitting: 10.6   Smokeless tobacco: Never   Tobacco comments:    smoked 1ppd for> 30 years/smokes occasionally,some vapor cigarettes  Substance Use Topics   Alcohol use: No    Alcohol/week: 0.0 standard drinks   Family History  Problem Relation Age of Onset   Heart disease Mother    Hypertension Mother    Heart disease Father    Heart disease Sister    Other Sister        tumor on diaphragm; oxygen depdt   CAD Brother        stenting   Pancreatic cancer Paternal Uncle    Other Cousin        sepsis   Colitis Neg Hx    Colon cancer Neg Hx    Esophageal cancer Neg Hx    Rectal cancer Neg Hx    Stomach cancer Neg Hx      Review of Systems  Constitutional:  Negative for activity change, appetite change, chills, fatigue, fever and unexpected weight  change.  HENT:  Negative for congestion, ear pain, hearing loss, sinus pressure, sinus pain, sore throat and trouble swallowing.   Eyes:  Negative for pain and visual disturbance.  Respiratory:  Negative for cough, chest tightness, shortness of breath and wheezing.   Cardiovascular:  Negative for chest pain, palpitations and leg swelling.  Gastrointestinal:  Negative for abdominal pain, blood in stool, constipation, diarrhea, nausea and vomiting.       See hpi- increased indigestion/heart burn   Genitourinary:  Negative for difficulty urinating and menstrual problem.  Musculoskeletal:  Positive for arthralgias (knees). Negative for back pain.  Skin:  Negative for rash.  Neurological:  Negative for dizziness, weakness, numbness and headaches.  Hematological:  Negative for adenopathy. Does not bruise/bleed easily.  Psychiatric/Behavioral:  Negative for sleep disturbance and suicidal ideas. The patient is not nervous/anxious.    CBC:  Lab Results  Component Value Date   WBC 6.2 02/08/2021   HGB 12.6 02/08/2021   HCT 37.0 02/08/2021   MCH 32.1 12/21/2019   MCHC 33.9 02/08/2021   RDW 14.4 02/08/2021   PLT 265.0 02/08/2021   MPV 10.5 12/21/2019   CMP: Lab Results  Component Value Date   NA 139 02/08/2021   K 3.6 02/08/2021   CL 98 02/08/2021   CO2 29 02/08/2021   ANIONGAP 15 05/12/2018   GLUCOSE 237 (H) 02/08/2021   BUN 17 02/08/2021   CREATININE 1.05 02/08/2021   CREATININE 0.97 12/21/2019   GFRAA >60 05/12/2018   CALCIUM 9.7 02/08/2021   PROT 7.4 02/08/2021   BILITOT 0.4 02/08/2021   ALKPHOS 47 02/08/2021   ALT 8 02/08/2021   AST 13 02/08/2021   LIPID: Lab Results  Component Value Date   CHOL 114 02/08/2021   TRIG 264.0 (H) 02/08/2021   TRIG 111 01/20/2006   HDL 29.40 (L) 02/08/2021   LDLCALC 46 12/21/2019    Objective:  BP 130/60 (BP Location: Left Arm, Patient Position: Sitting, Cuff Size: Normal)   Pulse 65   Temp 98.2 F (36.8 C) (Oral)   Ht 5\' 3"  (1.6  m)   Wt 154 lb 3.2 oz (69.9 kg)   SpO2 95%   BMI 27.32 kg/m   Weight: 154 lb 3.2 oz (69.9 kg)   BP Readings from Last 3 Encounters:  02/08/21 130/60  01/15/21  110/62  12/21/19 116/60   Wt Readings from Last 3 Encounters:  02/08/21 154 lb 3.2 oz (69.9 kg)  01/15/21 153 lb 12.8 oz (69.8 kg)  12/21/19 151 lb 4.8 oz (68.6 kg)    Physical Exam Constitutional:      General: She is not in acute distress.    Appearance: She is well-developed.  HENT:     Head: Normocephalic and atraumatic.     Right Ear: External ear normal.     Left Ear: External ear normal.     Mouth/Throat:     Pharynx: No oropharyngeal exudate.  Eyes:     Conjunctiva/sclera: Conjunctivae normal.     Pupils: Pupils are equal, round, and reactive to light.  Neck:     Thyroid: No thyromegaly.  Cardiovascular:     Rate and Rhythm: Normal rate and regular rhythm.     Heart sounds: Normal heart sounds. No murmur heard.   No friction rub. No gallop.  Pulmonary:     Effort: Pulmonary effort is normal.     Breath sounds: Normal breath sounds.  Abdominal:     General: Bowel sounds are normal. There is no distension.     Palpations: Abdomen is soft. There is no mass.     Tenderness: There is no abdominal tenderness. There is no guarding.     Hernia: No hernia is present.  Musculoskeletal:        General: No tenderness or deformity. Normal range of motion.     Cervical back: Normal range of motion and neck supple.     Comments: Bony enlargement bilat knees. Difficulty with getting up and down from exam table. Limited ROM of right knee (patient notes difficulty with trimming toenails).   Lymphadenopathy:     Cervical: No cervical adenopathy.  Skin:    General: Skin is warm and dry.     Findings: No rash.  Neurological:     Mental Status: She is alert and oriented to person, place, and time.     Deep Tendon Reflexes: Reflexes normal.     Reflex Scores:      Tricep reflexes are 2+ on the right side and 2+ on the  left side.      Bicep reflexes are 2+ on the right side and 2+ on the left side.      Brachioradialis reflexes are 2+ on the right side and 2+ on the left side.      Patellar reflexes are 2+ on the right side and 2+ on the left side. Psychiatric:        Speech: Speech normal.        Behavior: Behavior normal.        Thought Content: Thought content normal.    Assessment/Plan: Health Maintenance Due  Topic Date Due   TETANUS/TDAP  04/28/2019   Health Maintenance reviewed.  1. Preventative health care Discussed importance of regular exercise, healthy eating/low carb diet. Discussed eligible vaccinations today including prevnar 20, but she quite recently had prevnar 104 and 80, so we will hold off at least another year. She has had covid booster. Encouraged and discussed lung cancer screening.  She was hesitant to have screening, but she would follow through with treatment if she did find something abnormal on screening, so stressed that earlier screening is more beneficial.  We will continue to reasked her at future visits.  2. Gastroesophageal reflux disease, unspecified whether esophagitis present Start with protonix; if not improved with sx in 1 months time,  ref to GI. Discussed foods/drinks to avoid to help with reflux.  Let me know if any worsening of symptoms in the meanwhile.  Avoid eating a couple hours before bedtime to help with reflux through the night.  Discussed it would be okay to add an Pepcid twice daily if not noting improvement with Protonix.  3. Hypertension associated with diabetes ([place]) Continue with 25 mg metoprolol twice daily, Diovan 160-12.5.  Blood pressures well controlled. - CBC with Differential/Platelet; Future - Comprehensive metabolic panel; Future - Comprehensive metabolic panel - CBC with Differential/Platelet  4. Type 2 diabetes mellitus with diabetic neuropathy, without long-term current use of insulin ([place]) Recheck blood work.  Ideally would like to  get patient off of glimepiride as she is over 65.  Would like to swap out Januvia for perhaps a GLP-1.  We will see what A1c looks like and determine medicine changes from there. - Hemoglobin A1c; Future - Microalbumin / creatinine urine ratio; Future - HM DIABETES FOOT EXAM - Ambulatory referral to [facility] / creatinine urine ratio - Hemoglobin A1c  5. Hyperlipidemia associated with type 2 diabetes mellitus ([facility]) Continue Crestor 10 mg nightly.  Recheck lipid levels to maintain LDL goal below 80. - Lipid panel; Future - TSH; Future - TSH - Lipid panel  6. B12 deficiency - Vitamin B12; Future - Folate; Future - Folate - Vitamin B12  7. Onychomycosis She has a hard time with keeping nails trimmed properly.  Also difficult for her to properly reach nails with arthritis in her knees. - Ambulatory referral to Podiatry    Return in about 3 months (around 05/11/2021) for Chronic condition visit. Discuss completing pap smear at that time for final pap since last one was prior to age 45. We took time at today's visit discussing reflux, diabetic treatment plan.   [name], MD

## 2021-02-08 NOTE — Patient Instructions (Signed)
Try the protonix - once daily. This should help stop some of the acid production. If your symptoms get worse or are no better in 1 month, let me know. You can use pepcid in addition to the protonix if you feel you need some extra symptom control. Try protonix alone first x 2 weeks, but if not helping, then add in pepcid. You can either take the protonix first thing in the morning OR, if evening symptoms seem worse you can take 30 minutes prior to dinner. The pepcid can be taken at other times from protonix (ie if you take the protonix in the morning then you can take the pepcid at bedtime).

## 2021-02-11 DIAGNOSIS — E1140 Type 2 diabetes mellitus with diabetic neuropathy, unspecified: Secondary | ICD-10-CM | POA: Diagnosis not present

## 2021-02-11 DIAGNOSIS — E1159 Type 2 diabetes mellitus with other circulatory complications: Secondary | ICD-10-CM

## 2021-02-11 DIAGNOSIS — I152 Hypertension secondary to endocrine disorders: Secondary | ICD-10-CM | POA: Diagnosis not present

## 2021-02-14 ENCOUNTER — Telehealth: Payer: Self-pay | Admitting: Pharmacist

## 2021-02-14 NOTE — Chronic Care Management (AMB) (Addendum)
Chronic Care Management Pharmacy Assistant   Name: Andrea [name]  MRN: [id number] DOB: 1955-06-17  Reason for Encounter: Medication Review / Medication Coordination Call   Conditions to be addressed/monitored: HTN and DMII  Recent office visits:  02/08/2021 [name] MD (PCP) - Patient was seen for Preventative health care and additional issues. Started Pantoprazole 40 mg daily.  Follow up in 3 months.  Recent consult visits:  None  Hospital visits:  None  Medications: Outpatient Encounter Medications as of 02/14/2021  Medication Sig   aspirin 81 MG tablet Take 81 mg by mouth daily.     cetirizine (ZYRTEC) 10 MG tablet Take 10 mg by mouth daily.   glimepiride (AMARYL) 4 MG tablet TAKE ONE TABLET BY MOUTH BEFORE BREAKFAST   metFORMIN (GLUCOPHAGE) 1000 MG tablet TAKE ONE TABLET BY MOUTH TWICE DAILY AT 4am AND 6pm ON work DAYS, 7-8am AND 6pm ON non work DAYS   metoprolol tartrate (LOPRESSOR) 50 MG tablet TAKE 1/2 TABLET BY MOUTH TWICE DAILY   ONETOUCH DELICA LANCETS FINE MISC 1 Stick by Does not apply route daily.   ONETOUCH ULTRA test strip USE TO CHECK BLOOD SUGAR DAILY AND AS NEEDED   pantoprazole (PROTONIX) 40 MG tablet Take 1 tablet (40 mg total) by mouth daily.   rosuvastatin (CRESTOR) 10 MG tablet TAKE ONE TABLET BY MOUTH EVERY EVENING   sitaGLIPtin (JANUVIA) 100 MG tablet TAKE ONE TABLET BY MOUTH ONCE DAILY   valsartan-hydrochlorothiazide (DIOVAN-HCT) 160-12.5 MG tablet TAKE ONE TABLET BY MOUTH ONCE DAILY   vitamin B-12 (CYANOCOBALAMIN) 500 MCG tablet Take 500 mcg by mouth daily.    No facility-administered encounter medications on file as of 02/14/2021.  Reviewed chart for medication changes ahead of medication coordination call.  No OVs, Consults, or hospital visits since last care coordination call/Pharmacist visit. (If appropriate, list visit date, provider name)  No medication changes indicated OR if recent visit, treatment plan here.  BP Readings from  Last 3 Encounters:  02/08/21 130/60  01/15/21 110/62  12/21/19 116/60    Lab Results  Component Value Date   HGBA1C 7.0 (H) 02/08/2021     Patient obtains medications through Adherence Packaging  30 Days   Last adherence delivery included:  Valsartan-hydrochlorothiazide 160/12.5 mg One tablet daily (breakfast) Glimepiride 4 mg One tablet daily (before breakfast) Metoprolol 50 mg One half tablet twice daily (Breakfast and evening meal) Januvia 100 mg- 1 tablet daily (breakfast)                  Metformin 1000 mg- 1 tablet twice daily (Breakfast and evening meal)  Rosuvastatin 10mg  - take 1 tablet daily   Patient is due for next adherence delivery on: 02/26/2021.  Called patient and reviewed medications and coordinated delivery.  This delivery to include: Valsartan-hydrochlorothiazide 160/12.5 mg One tablet daily (breakfast) Rosuvastatin 10mg  - take 1 tablet daily Metformin 1000 mg- 1 tablet twice daily (Breakfast and evening meal) Glimepiride 4 mg One tablet daily (before breakfast) Metoprolol 50 mg One half tablet twice daily (Breakfast and evening meal) Folic acid 1 mg - 1 tablet at breakfast    Care Gaps: AWV - scheduled today 01/15/2021 Last BP reading - 116/60 on 12/21/2019 Last HGA1C - 6.2 on 12/21/2019 Tetanus/TDAP - overdue Covid 19 booster 3 - overdue HGA1C - overdue Foot exam - overdue Flu - due  Star Rating Drugs: Glimepiride 4 mg - last filled 11/01/2020 [facility] Metformin 1000 mg - last filled 11/01/2020 [facility] Rosuvastatin  10 mg - Januvia 100 mg - last filled 11/01/2020 [facility] Diovan-HCT 160/12.5 mg - last filled 11/01/2020 [facility] at [facility] Pharmacist Assistant [phone]

## 2021-02-18 ENCOUNTER — Telehealth: Payer: Self-pay | Admitting: Pharmacist

## 2021-02-18 ENCOUNTER — Other Ambulatory Visit: Payer: Self-pay | Admitting: Family Medicine

## 2021-02-18 DIAGNOSIS — Z87891 Personal history of nicotine dependence: Secondary | ICD-10-CM

## 2021-02-18 NOTE — Telephone Encounter (Signed)
Sounds good thanks. I put in order for CT lungs.

## 2021-02-18 NOTE — Telephone Encounter (Signed)
Called patient to discuss lab results and next steps for diabetes management. Left voicemail and requested a call back.

## 2021-02-18 NOTE — Telephone Encounter (Signed)
Patient called back. Discussed below recommendations. Patient is going to try Ozempic injections and scheduled for in office training on 11/16. Plan to sample patient to make sure she tolerates.  Patient is aware to start taking Folic acid 1 mg daily.   She also agreed to get the lung cancer screening. Will route to PCP to place the order.

## 2021-02-18 NOTE — Telephone Encounter (Signed)
-----   Message from Caren Macadam, MD sent at 02/10/2021  9:04 AM EDT ----- #A1C higher. Thinking good time for transition away from glimepiride and over to either ozempic or rybelsus. Whichever she prefers and if you can help with patient assistance, that would be great. Repeat A1C in 3 mo time (we can just do this in office as POC).  #folate deficient. Recommend 1mg  daily folate supplement. Could take with vitamin B12 as well.  #good cholesterol is low - work on getting daily exercise.  #She declined lung cancer screening before, but ask again if she wants this. I told her to consider since she said she would get work up/treatment if there were a cancer found.

## 2021-02-19 ENCOUNTER — Other Ambulatory Visit: Payer: Self-pay | Admitting: Family Medicine

## 2021-02-19 MED ORDER — FOLIC ACID 1 MG PO TABS: 1.0000 mg | ORAL_TABLET | Freq: Every day | ORAL | 1 refills | Status: DC

## 2021-02-26 ENCOUNTER — Telehealth: Payer: Self-pay | Admitting: Pharmacist

## 2021-02-26 NOTE — Chronic Care Management (AMB) (Signed)
    Chronic Care Management Pharmacy Assistant   Name: KRISANN MCKENNA  MRN: [id number] DOB: [birth year]  02/27/2021 APPOINTMENT REMINDER   Called [facility], No answer, left message of appointment on 02/27/2021 at 4:30 via office visit with [name], Pharm D. Notified to have all medications, supplements, blood pressure and/or blood sugar logs available during appointment and to return call if need to reschedule.  Care Gaps: AWV - scheduled today 01/15/2021 Last BP reading - 130/60 on 02/08/2021 Last HGA1C - 7.0 on 02/08/2021 Tetanus/TDAP - overdue Covid 19 booster 3 - overdue HGA1C - overdue Foot exam - overdue Flu - due  Star Rating Drug: Glimepiride 4 mg - last filled 02/19/2021 [facility] Metformin 1000 mg - last filled 02/19/2021 [facility] Rosuvastatin 10 mg - last filled 02/19/2021 [facility] Januvia 100 mg - last filled 01/21/2021 [facility] Diovan-HCT 160/12.5 mg - last filled 02/19/2021 [facility]  Any gaps in medications fill history?  [facility] Pharmacist Assistant [phone]

## 2021-02-27 ENCOUNTER — Ambulatory Visit (INDEPENDENT_AMBULATORY_CARE_PROVIDER_SITE_OTHER): Payer: Medicare Other | Admitting: Pharmacist

## 2021-02-27 ENCOUNTER — Ambulatory Visit: Payer: Medicare Other | Admitting: Podiatry

## 2021-02-27 DIAGNOSIS — E1169 Type 2 diabetes mellitus with other specified complication: Secondary | ICD-10-CM

## 2021-02-27 DIAGNOSIS — E1140 Type 2 diabetes mellitus with diabetic neuropathy, unspecified: Secondary | ICD-10-CM

## 2021-02-27 NOTE — Patient Instructions (Signed)
Hi Richa,  It was great to get to meet you in person! We will check back in around 1 month out and see how you are doing with blood sugars and may stop the glimepiride at that point.  Please reach out to me if you have any questions or need anything!  Best, Maddie  [name], PharmD, [facility]ist [facility]

## 2021-02-27 NOTE — Progress Notes (Signed)
Chronic Care Management Pharmacy Note  02/27/2021 Name:  Andrea [name] MRN:  [id number] DOB:  11-03-1955  Summary: A1c not at goal < 7%    Recommendations/Changes made from today's visit: -Started Ozempic 0.25 mg injection once weekly and plan to increase to 0.5 mg weekly after 4 weeks -Removed Januvia from medication list   Plan: Follow up DM assessment in 1 month  Subjective: Andrea [name] is an 65 y.o. year old female who is a primary patient of Koberlein, Steele Berg, MD.  The CCM team was consulted for assistance with disease management and care coordination needs.    Engaged with patient face to face for follow up visit in response to provider referral for pharmacy case management and/or care coordination services.   Consent to Services:  The patient was given information about Chronic Care Management services, agreed to services, and gave verbal consent prior to initiation of services.  Please see initial visit note for detailed documentation.   Patient Care Team: Caren Macadam, MD as PCP - General (Family Medicine) Rutherford Guys, MD as Consulting Physician (Ophthalmology) [name], [facility] as Pharmacist (Pharmacist)  Recent office visits: 02/08/21 [name], MD: Patient presented for annual exam. A1c increased to 7.0%. Referred to podiatry. Prescribed pantoprazole 40 mg daily.  01/15/21 [name], LPN: Patient presented for AWV.  Recent consult visits: 06/18/20 Rutherford Guys, MD ([facility]): Patient presented for diabetic eye exam.   Hospital visits: None in previous 6 months  Objective:  Lab Results  Component Value Date   CREATININE 1.05 02/08/2021   BUN 17 02/08/2021   GFR 54.94 (L) 02/08/2021   GFRNONAA >60 05/12/2018   GFRAA >60 05/12/2018   NA 139 02/08/2021   K 3.6 02/08/2021   CALCIUM 9.7 02/08/2021   CO2 29 02/08/2021   GLUCOSE 237 (H) 02/08/2021    Lab Results  Component Value Date/Time   HGBA1C 7.0 (H) 02/08/2021  11:38 AM   HGBA1C 6.2 (H) 12/21/2019 08:54 AM   GFR 54.94 (L) 02/08/2021 11:38 AM   GFR 53.62 (L) 06/20/2019 12:50 PM   MICROALBUR 2.2 (H) 02/08/2021 11:38 AM   MICROALBUR 1.5 12/21/2019 08:54 AM    Last diabetic Eye exam:  Lab Results  Component Value Date/Time   HMDIABEYEEXA No Retinopathy 06/18/2020 12:00 AM    Last diabetic Foot exam:  Lab Results  Component Value Date/Time   HMDIABFOOTEX done 07/01/2009 12:00 AM     Lab Results  Component Value Date   CHOL 114 02/08/2021   HDL 29.40 (L) 02/08/2021   LDLCALC 46 12/21/2019   LDLDIRECT 56.0 02/08/2021   TRIG 264.0 (H) 02/08/2021   CHOLHDL 4 02/08/2021    Hepatic Function Latest Ref Rng & Units 02/08/2021 12/21/2019 06/20/2019  Total Protein 6.0 - 8.3 g/dL 7.4 6.9 6.8  Albumin 3.5 - 5.2 g/dL 4.3 - 4.0  AST 0 - 37 U/L _0 ALT 0 - 35 U/L _1 Alk Phosphatase 39 - 117 U/L 47 - 60  Total Bilirubin 0.2 - 1.2 mg/dL 0.4 0.2 0.3  Bilirubin, Direct 0.0 - 0.3 mg/dL - - -    Lab Results  Component Value Date/Time   TSH 3.35 02/08/2021 11:38 AM   TSH 3.29 12/21/2019 08:54 AM    CBC Latest Ref Rng & Units 02/08/2021 12/21/2019 06/20/2019  WBC 4.0 - 10.5 K/uL 6.2 7.4 6.9  Hemoglobin 12.0 - 15.0 g/dL 12.6 12.6 13.2  Hematocrit 36.0 - 46.0 % 37.0  37.2 37.9  Platelets 150.0 - 400.0 K/uL 265.0 259 254.0    Lab Results  Component Value Date/Time   VD25OH 35 12/21/2019 08:54 AM    Clinical ASCVD: Yes  The ASCVD Risk score (Arnett DK, et al., 2019) failed to calculate for the following reasons:   The valid total cholesterol range is 130 to 320 mg/dL    Depression screen [facility] 2/9 01/15/2021 01/10/2020 12/21/2019  Decreased Interest 0 0 0  Down, Depressed, Hopeless 0 0 0  PHQ - 2 Score 0 0 0     Social History   Tobacco Use  Smoking Status Former   Types: E-cigarettes   Quit date: 06/20/2010   Years since quitting: 10.6  Smokeless Tobacco Never  Tobacco Comments   smoked 1ppd for> 30 years/smokes occasionally,some vapor  cigarettes   BP Readings from Last 3 Encounters:  02/08/21 130/60  01/15/21 110/62  12/21/19 116/60   Pulse Readings from Last 3 Encounters:  02/08/21 65  01/15/21 (!) 53  12/21/19 67   Wt Readings from Last 3 Encounters:  02/08/21 154 lb 3.2 oz (69.9 kg)  01/15/21 153 lb 12.8 oz (69.8 kg)  12/21/19 151 lb 4.8 oz (68.6 kg)   BMI Readings from Last 3 Encounters:  02/08/21 27.32 kg/m  01/15/21 26.61 kg/m  12/21/19 26.17 kg/m    Assessment/Interventions: Review of patient past medical history, allergies, medications, health status, including review of consultants reports, laboratory and other test data, was performed as part of comprehensive evaluation and provision of chronic care management services.   SDOH:  (Social Determinants of Health) assessments and interventions performed: No  SDOH Screenings   Alcohol Screen: Not on file  Depression (PHQ2-9): Low Risk    PHQ-2 Score: 0  Financial Resource Strain: Low Risk    Difficulty of Paying Living Expenses: Not hard at all  Food Insecurity: No Food Insecurity   Worried About Charity fundraiser in the Last Year: Never true   Ran Out of Food in the Last Year: Never true  Housing: Low Risk    Last Housing Risk Score: 0  Physical Activity: Insufficiently Active   Days of Exercise per Week: 3 days   Minutes of Exercise per Session: 20 min  Social Connections: Socially Isolated   Frequency of Communication with Friends and Family: Three times a week   Frequency of Social Gatherings with Friends and Family: More than three times a week   Attends Religious Services: Never   Marine scientist or Organizations: No   Attends Music therapist: Never   Marital Status: Divorced  Stress: No Stress Concern Present   Feeling of Stress : Not at all  Tobacco Use: Medium Risk   Smoking Tobacco Use: Former   Smokeless Tobacco Use: Never   Passive Exposure: Not on file  Transportation Needs: No Transportation Needs    Lack of Transportation (Medical): No   Lack of Transportation (Non-Medical): No    CCM Care Plan  Allergies  Allergen Reactions   Plavix [Clopidogrel Bisulfate]     Caused platelets to drop    Medications Reviewed Today     Reviewed by Nilda Riggs, CMA (Certified Medical Assistant) on 02/08/21 at 1029  Med List Status: <None>   Medication Order Taking? Sig Documenting Provider Last Dose Status Informant  aspirin 81 MG tablet [id number] Yes Take 81 mg by mouth daily.   [provider] Taking Active Self  cetirizine (ZYRTEC) 10 MG tablet [id number] Yes Take  10 mg by mouth daily. [provider] Taking Active Self  glimepiride (AMARYL) 4 MG tablet [id number] Yes TAKE ONE TABLET BY MOUTH BEFORE BREAKFAST Koberlein, Junell C, MD Taking Active   metFORMIN (GLUCOPHAGE) 1000 MG tablet [id number] Yes TAKE ONE TABLET BY MOUTH TWICE DAILY AT 4am AND 6pm ON work DAYS, 7-8am AND 6pm ON non work DAYS Caren Macadam, MD Taking Active   metoprolol tartrate (LOPRESSOR) 50 MG tablet [id number] Yes TAKE 1/2 TABLET BY MOUTH TWICE DAILY Caren Macadam, MD Taking Active   [facility] LANCETS [place] [id number] Yes 1 Stick by Does not apply route daily. Ricard Dillon, MD Taking Active Self  [facility] ULTRA test strip [id number] Yes USE TO CHECK BLOOD SUGAR DAILY AND AS NEEDED Lucretia Kern, DO Taking Active   rosuvastatin (CRESTOR) 10 MG tablet [id number] Yes TAKE ONE TABLET BY MOUTH EVERY EVENING Koberlein, Junell C, MD Taking Active   sitaGLIPtin (JANUVIA) 100 MG tablet [id number] Yes TAKE ONE TABLET BY MOUTH ONCE DAILY Koberlein, Junell C, MD Taking Active   valsartan-hydrochlorothiazide (DIOVAN-HCT) 160-12.5 MG tablet [id number] Yes TAKE ONE TABLET BY MOUTH ONCE DAILY Koberlein, Junell C, MD Taking Active   vitamin B-12 (CYANOCOBALAMIN) 500 MCG tablet [id number] Yes Take 500 mcg by mouth daily.  [provider] Taking Active Self            Patient Active Problem  List   Diagnosis Date Noted   Hyperlipidemia associated with type 2 diabetes mellitus ([place]) 09/09/2018   B12 deficiency 09/09/2018   Type 2 diabetes mellitus with diabetic neuropathy, without long-term current use of insulin ([place]) 05/24/2015   Hx of adenomatous colonic polyps 08/09/2014   Osteoarthritis 05/07/2007   Hypertension associated with diabetes ([place]) 11/19/2006   Coronary atherosclerosis 11/09/2006    Immunization History  Administered Date(s) Administered   Fluad Quad(high Dose 65+) 12/21/2019, 02/06/2021   Influenza Whole 01/12/1997, 01/05/2009   Influenza,inj,Quad PF,6+ Mos 01/12/2015, 12/27/2015, 03/09/2017, 12/01/2018   Influenza-Unspecified 02/12/2014, 01/19/2018   PFIZER(Purple Top)SARS-COV-2 Vaccination 07/07/2019, 08/01/2019, 02/06/2021   Pneumococcal Conjugate-13 12/01/2018   Pneumococcal Polysaccharide-23 04/24/2008, 01/31/2014, 12/21/2019   Td 11/13/1997, 04/27/2009   Zoster Recombinat (Shingrix) 11/12/2017, 01/12/2018   Patient presented for Ozempic injection training. Patient administered her first dose in office. Provided sample pen.  Conditions to be addressed/monitored:  Hypertension, Hyperlipidemia, Diabetes and vitamin B12 deficiency  Conditions addressed this visit: Diabetes, hypertension  There are no care plans that you recently modified to display for this patient.   Medication Assistance: None required.  Patient affirms current coverage meets needs.  Compliance/Adherence/Medication fill history: Care Gaps: AWV 01-15-21 A1C - 6.2 (12-2019) BP - 110/62 TDAP - Overdue COVID Booster #3 (Pfizer) - Overdue HGB A1C - Overdue Flu Vaccine - Overdue Foot Exam - Overdue   Star-Rating Drugs: Glimepiride (Amaryl) 4 mg - Last filled 11-01-2020 90 DS at [facility] Metformin (Glucophage) 1000 mg - Last filled 11-01-2020 90 DS at [facility] Rosuvastatin (Crestor) 10 mg - Last filled 11-01-2020 90 DS at [facility] Sitagliptin (Januvia) 100 mg - Last filled  11-01-2020 90 DS at [facility] Valsartan- HCTZ (Diovan HCT) 160-12.5 mg - Last filled 11-01-2020 90 DS at [facility]  Patient's preferred pharmacy is:  [facility] [place], [place] [address] [place] [address] Phone: [phone] Fax: [phone]  [facility] - [place], [place] - [address] [place]  [place] [address] Phone: [phone] Fax: [phone]  Uses pill box? Yes - restocks on Wednesdays Pt endorses 100% compliance (sometime she might forget)  We discussed: Benefits of medication synchronization, packaging and delivery as well as enhanced pharmacist oversight with [facility]. Patient decided to: Utilize [facility] pharmacy for medication synchronization, packaging and delivery  Care Plan and Follow Up Patient Decision:  Patient agrees to Care Plan and Follow-up.  Plan: Telephone follow up appointment with care management team member scheduled for:  6 months  [name], PharmD [facility] Pharmacist [place] [facility] [place] [phone]

## 2021-02-28 ENCOUNTER — Other Ambulatory Visit: Payer: Self-pay | Admitting: *Deleted

## 2021-02-28 MED ORDER — OZEMPIC (0.25 OR 0.5 MG/DOSE) 2 MG/1.5ML ~~LOC~~ SOPN: 0.5000 mg | PEN_INJECTOR | SUBCUTANEOUS | 0 refills | Status: DC

## 2021-03-04 ENCOUNTER — Other Ambulatory Visit: Payer: Self-pay

## 2021-03-04 ENCOUNTER — Ambulatory Visit: Payer: Medicare Other | Admitting: Podiatry

## 2021-03-04 VITALS — BP 134/74 | HR 71 | Temp 98.5°F

## 2021-03-04 DIAGNOSIS — E0843 Diabetes mellitus due to underlying condition with diabetic autonomic (poly)neuropathy: Secondary | ICD-10-CM

## 2021-03-04 DIAGNOSIS — M79675 Pain in left toe(s): Secondary | ICD-10-CM | POA: Diagnosis not present

## 2021-03-04 DIAGNOSIS — M79674 Pain in right toe(s): Secondary | ICD-10-CM

## 2021-03-04 DIAGNOSIS — B351 Tinea unguium: Secondary | ICD-10-CM | POA: Diagnosis not present

## 2021-03-04 NOTE — Progress Notes (Signed)
   SUBJECTIVE Patient with a history of diabetes mellitus presents to office today complaining of elongated, thickened nails that cause pain while ambulating in shoes.  Patient is unable to trim their own nails. Patient is here for further evaluation and treatment.   Past Medical History:  Diagnosis Date   Allergy    Anemia    Arthritis    B12 deficiency 09/09/2018   CAD (coronary artery disease)    has 3 stents   Cataract    removed both eyes    Diabetes mellitus    Hx of adenomatous colonic polyps 08/09/2014   Hyperlipidemia    Hypertension    Low back pain     OBJECTIVE General Patient is awake, alert, and oriented x 3 and in no acute distress. Derm Skin is dry and supple bilateral. Negative open lesions or macerations. Remaining integument unremarkable. Nails are tender, long, thickened and dystrophic with subungual debris, consistent with onychomycosis, 1-5 bilateral. No signs of infection noted. Vasc  DP and PT pedal pulses palpable bilaterally. Temperature gradient within normal limits.  Neuro Epicritic and protective threshold sensation diminished bilaterally.  Musculoskeletal Exam No symptomatic pedal deformities noted bilateral. Muscular strength within normal limits.  ASSESSMENT 1. Diabetes Mellitus w/ peripheral neuropathy 2.  Pain due to onychomycosis of toenails bilateral  PLAN OF CARE 1. Patient evaluated today.  Comprehensive diabetic foot exam performed today 2. Instructed to maintain good pedal hygiene and foot care. Stressed importance of controlling blood sugar.  3. Mechanical debridement of nails 1-5 bilaterally performed using a nail nipper. Filed with dremel without incident.  4. Return to clinic in 3 mos.     Edrick Kins, DPM [facility]  Dr. Edrick Kins, DPM    [address], [place] [address]                Office [phone]  Fax [phone]

## 2021-03-13 DIAGNOSIS — E785 Hyperlipidemia, unspecified: Secondary | ICD-10-CM

## 2021-03-13 DIAGNOSIS — E1169 Type 2 diabetes mellitus with other specified complication: Secondary | ICD-10-CM

## 2021-03-13 DIAGNOSIS — E1140 Type 2 diabetes mellitus with diabetic neuropathy, unspecified: Secondary | ICD-10-CM

## 2021-03-15 ENCOUNTER — Telehealth: Payer: Self-pay | Admitting: Pharmacist

## 2021-03-15 NOTE — Chronic Care Management (AMB) (Addendum)
Chronic Care Management Pharmacy Assistant   Name: Andrea [name]  MRN: [id number] DOB: 10-Nov-1955  Reason for Encounter: Disease State and Medication Review / Hypertension and Diabetes assessment call and Medication Coordination Call   Conditions to be addressed/monitored: HTN and DMII  Recent office visits:  None  Recent consult visits:  03/04/2021 Andrea [name] DPM (podiatry) - Patient was seen for diabetes mellitus due to underlying condition with diabetic autonomic neuropathy, unspecified whether long term insulin use. No medication changes. Follow up in 3 months.  Hospital visits:  None  Medications: Outpatient Encounter Medications as of 03/15/2021  Medication Sig   aspirin 81 MG tablet Take 81 mg by mouth daily.     cetirizine (ZYRTEC) 10 MG tablet Take 10 mg by mouth daily.   folic acid (FOLVITE) 1 MG tablet Take 1 tablet (1 mg total) by mouth daily.   glimepiride (AMARYL) 4 MG tablet TAKE ONE TABLET BY MOUTH BEFORE BREAKFAST   metFORMIN (GLUCOPHAGE) 1000 MG tablet TAKE ONE TABLET BY MOUTH TWICE DAILY AT 4am AND 6pm ON work DAYS, 7-8am AND 6pm ON non work DAYS   metoprolol tartrate (LOPRESSOR) 50 MG tablet TAKE 1/2 TABLET BY MOUTH TWICE DAILY   ONETOUCH DELICA LANCETS FINE MISC 1 Stick by Does not apply route daily.   ONETOUCH ULTRA test strip USE TO CHECK BLOOD SUGAR DAILY AND AS NEEDED   pantoprazole (PROTONIX) 40 MG tablet Take 1 tablet (40 mg total) by mouth daily.   rosuvastatin (CRESTOR) 10 MG tablet TAKE ONE TABLET BY MOUTH EVERY EVENING   Semaglutide,0.25 or 0.5MG /DOS, (OZEMPIC, 0.25 OR 0.5 MG/DOSE,) 2 MG/1.5ML SOPN Inject 0.5 mg into the skin once a week.   valsartan-hydrochlorothiazide (DIOVAN-HCT) 160-12.5 MG tablet TAKE ONE TABLET BY MOUTH ONCE DAILY   vitamin B-12 (CYANOCOBALAMIN) 500 MCG tablet Take 500 mcg by mouth daily.    No facility-administered encounter medications on file as of 03/15/2021.  Fill History: folic acid 1 mg tablet 02/19/2021 30    GLIMEPIRIDE  4 MG TABS 03/14/2021 90   METOPROLOL TARTRATE  50 MG TABS 03/13/2021 90   PANTOPRAZOLE 40MG  TABLETS 02/08/2021 90   ROSUVASTATIN CALCIUM  10 MG TABS 03/14/2021 90   Januvia 100 mg tablet 01/21/2021 30   VALSARTAN/HYDROCHLOROTHIAZIDE  160-12.5 MG TABS 03/14/2021 90   Reviewed chart prior to disease state call. Spoke with patient regarding BP  Recent Office Vitals: BP Readings from Last 3 Encounters:  03/04/21 134/74  02/08/21 130/60  01/15/21 110/62   Pulse Readings from Last 3 Encounters:  03/04/21 71  02/08/21 65  01/15/21 (!) 53    Wt Readings from Last 3 Encounters:  02/08/21 154 lb 3.2 oz (69.9 kg)  01/15/21 153 lb 12.8 oz (69.8 kg)  12/21/19 151 lb 4.8 oz (68.6 kg)     Kidney Function Lab Results  Component Value Date/Time   CREATININE 1.05 02/08/2021 11:38 AM   CREATININE 0.97 12/21/2019 08:54 AM   CREATININE 1.03 06/20/2019 12:50 PM   GFR 54.94 (L) 02/08/2021 11:38 AM   GFRNONAA >60 05/12/2018 11:02 AM   GFRAA >60 05/12/2018 11:02 AM    BMP Latest Ref Rng & Units 02/08/2021 12/21/2019 06/20/2019  Glucose 70 - 99 mg/dL 237(H) 131(H) 147(H)  BUN 6 - 23 mg/dL 17 18 18   Creatinine 0.40 - 1.20 mg/dL 1.05 0.97 1.03  BUN/Creat Ratio 6 - 22 (calc) - NOT APPLICABLE -  Sodium 211 - 145 mEq/L 139 138 135  Potassium 3.5 - 5.1 mEq/L 3.6 3.8  4.1  Chloride 96 - 112 mEq/L 98 104 100  CO2 19 - 32 mEq/L 29 25 28   Calcium 8.4 - 10.5 mg/dL 9.7 10.8(H) 10.3    Current antihypertensive regimen:  Diovan HCT 160/12.5 daily  How often are you checking your Blood Pressure?  Monthly  Current home BP readings: Patient states last readings were around 135/82 and 140/84  What recent interventions/DTPs have been made by any provider to improve Blood Pressure control since last CPP Visit: None  Any recent hospitalizations or ED visits since last visit with CPP? No  What diet changes have been made to improve Blood Pressure Control?  None, patient has egg whites,  bacon, grits or toast for breakfast, no lunch and meat, vegetable and starch for dinner.  What exercise is being done to improve your Blood Pressure Control?  Walks daily  Adherence Review: Is the patient currently on ACE/ARB medication? Yes Does the patient have >5 day gap between last estimated fill dates? No  Recent Relevant Labs: Lab Results  Component Value Date/Time   HGBA1C 7.0 (H) 02/08/2021 11:38 AM   HGBA1C 6.2 (H) 12/21/2019 08:54 AM   MICROALBUR 2.2 (H) 02/08/2021 11:38 AM   MICROALBUR 1.5 12/21/2019 08:54 AM    Kidney Function Lab Results  Component Value Date/Time   CREATININE 1.05 02/08/2021 11:38 AM   CREATININE 0.97 12/21/2019 08:54 AM   CREATININE 1.03 06/20/2019 12:50 PM   GFR 54.94 (L) 02/08/2021 11:38 AM   GFRNONAA >60 05/12/2018 11:02 AM   GFRAA >60 05/12/2018 11:02 AM    Current antihyperglycemic regimen:  Glimepiride 4 mg daily Metformin 1000 mg twice daily Ozempic 0.25 mg weekly  What recent interventions/DTPs have been made to improve glycemic control:  Started Ozempic  Have there been any recent hospitalizations or ED visits since last visit with CPP? No  Patient denies hypoglycemic symptoms, including None  Patient denies hyperglycemic symptoms, including none  How often are you checking your blood sugar? once daily  What are your blood sugars ranging?  After meals: 129 and 143  During the week, how often does your blood glucose drop below 70? Never  Are you checking your feet daily/regularly? Yes  Adherence Review: Is the patient currently on a STATIN medication? Yes Is the patient currently on ACE/ARB medication? Yes Does the patient have >5 day gap between last estimated fill dates? No  Reviewed chart for medication changes ahead of medication coordination call.  No OVs, Consults, or hospital visits since last care coordination call/Pharmacist visit. (If appropriate, list visit date, provider name)  No medication changes  indicated OR if recent visit, treatment plan here.  BP Readings from Last 3 Encounters:  03/04/21 134/74  02/08/21 130/60  01/15/21 110/62    Lab Results  Component Value Date   HGBA1C 7.0 (H) 02/08/2021     Patient obtains medications through Adherence Packaging  30 Days   Last adherence delivery included: Valsartan-hydrochlorothiazide 160/12.5 mg One tablet daily (breakfast) Rosuvastatin 10mg  - take 1 tablet daily Metformin 1000 mg- 1 tablet twice daily (Breakfast and evening meal) Glimepiride 4 mg One tablet daily (before breakfast) Metoprolol 50 mg One half tablet twice daily (Breakfast and evening meal) Folic acid 1 mg - 1 tablet at breakfast  Patient declined (meds) last month: None  Patient is due for next adherence delivery on: 03/27/2021.  Called patient and reviewed medications and coordinated delivery.  This delivery to include: Valsartan-hydrochlorothiazide 160/12.5 mg One tablet daily (breakfast) Rosuvastatin 10mg  - take 1 tablet  daily Metformin 1000 mg- 1 tablet twice daily (Breakfast and evening meal) Glimepiride 4 mg One tablet daily (before breakfast) Metoprolol 50 mg One half tablet twice daily (Breakfast and evening meal) Folic acid 1 mg - 1 tablet at breakfast  Patient will need a short fill: None  Coordinated acute fill: None  Patient declined the following medications: None  Confirmed delivery date of 03/27/2021, advised patient that pharmacy will contact them the morning of delivery.  Care Gaps: AWV - completed 01/15/2021 Last BP - 134/74 on 03/04/2021 Last HGA1C - 7.0 on 02/08/2021 Tetanus/TDAP - overdue Covid 19 booster 3 - overdue HGA1C - overdue Foot exam - overdue Flu - due  Star Rating Drugs: Rosuvastatin 10 mg - last filled 02/19/2021 30[facility] at [facility] Januvia 100 mg - last filled 01/21/2021 30 DS at [facility] Valsartan HCTZ - last filled 02/19/2021 30[facility] at [facility] Metformin 1000 mg - last filled 02/19/2021 [facility] at  [facility] Glimepiride 4 mg - last filled 02/19/2021 30DS at [facility] Pharmacist Assistant [phone]

## 2021-03-20 NOTE — Chronic Care Management (AMB) (Signed)
Called [facility] verified with Maudie Mercury these medications are there and have not been picked up except for Pantoprazole, this was picked up on 02/12/2021. Cancelled these 6 meds as they are filled at [facility].  Metformin 1000mg  Valsartan/HCTZ 160/12.5 Rosuvastatin 10mg  Pantoprazole 40mg  Metoprolol 50mg   Glimepiride 4mg 

## 2021-03-27 ENCOUNTER — Telehealth: Payer: Self-pay | Admitting: Pharmacist

## 2021-03-27 NOTE — Progress Notes (Signed)
° ° °  Chronic Care Management Pharmacy Assistant   Name: Andrea [name]  MRN: [id number] DOB: Mar 08, 1956  Reason for Encounter: Follow up on patients blood sugars after starting Ozempic. Spoke with patient, her glucose readings for the past week. 12/07 - 169 fasting 12/08 - 153 non fasting 12/09 - 155 fasting 12/10 - 142 non fasting 12/11 - 185 fasting 12/13 - 127 fasting 12/14 - 76 non fasting (patient started Ozempic 0.5 mg) 12/15 - 146 fasting 14 day average is 182  Care Gaps: AWV - completed 01/15/2021 Last BP - 134/74 on 03/04/2021 Last HGA1C - 7.0 on 02/08/2021 Tetanus/TDAP - overdue Covid 19 booster 3 - overdue HGA1C - overdue Foot exam - overdue Flu - due  Star Rating Drugs: Rosuvastatin 10 mg - last filled 03/14/2021 [facility] Januvia 100 mg - last filled 01/21/2021 30 DS at [facility] Valsartan HCTZ - last filled 03/14/2021 30[facility] at [facility] Metformin 1000 mg - last filled 03/14/2021 30[facility] at [facility] Glimepiride 4 mg - last filled 03/14/2021 30DS at [place] [phone]

## 2021-04-02 ENCOUNTER — Other Ambulatory Visit: Payer: Self-pay | Admitting: Family Medicine

## 2021-04-10 ENCOUNTER — Other Ambulatory Visit: Payer: Self-pay | Admitting: Family Medicine

## 2021-04-10 DIAGNOSIS — E1140 Type 2 diabetes mellitus with diabetic neuropathy, unspecified: Secondary | ICD-10-CM

## 2021-04-10 MED ORDER — ONETOUCH ULTRA VI STRP: ORAL_STRIP | 1 refills | Status: DC

## 2021-04-10 MED ORDER — OZEMPIC (0.25 OR 0.5 MG/DOSE) 2 MG/1.5ML ~~LOC~~ SOPN: 0.5000 mg | PEN_INJECTOR | SUBCUTANEOUS | 3 refills | Status: DC

## 2021-04-16 ENCOUNTER — Telehealth: Payer: Self-pay | Admitting: Pharmacist

## 2021-04-16 NOTE — Chronic Care Management (AMB) (Signed)
Chronic Care Management Pharmacy Assistant   Name: Andrea [name]  MRN: [id number] DOB: [birth year]  Reason for Encounter: Medication Review / Medication Coordination Call   Conditions to be addressed/monitored: HTN   Recent office visits:  None  Recent consult visits:  None  Hospital visits:  None  Medications: Outpatient Encounter Medications as of 04/16/2021  Medication Sig   aspirin 81 MG tablet Take 81 mg by mouth daily.     cetirizine (ZYRTEC) 10 MG tablet Take 10 mg by mouth daily.   folic acid (FOLVITE) 1 MG tablet Take 1 tablet (1 mg total) by mouth daily.   glimepiride (AMARYL) 4 MG tablet TAKE ONE TABLET BY MOUTH BEFORE BREAKFAST   glucose blood (ONETOUCH ULTRA) test strip USE TO CHECH BLOOD SUGAR DAILY AND AS NEEDED   metFORMIN (GLUCOPHAGE) 1000 MG tablet TAKE ONE TABLET BY MOUTH TWICE DAILY AT 4am AND 6pm ON work DAYS, 7-8am AND 6pm ON non work DAYS   metoprolol tartrate (LOPRESSOR) 50 MG tablet TAKE 1/2 TABLET BY MOUTH TWICE DAILY   ONETOUCH DELICA LANCETS FINE MISC 1 Stick by Does not apply route daily.   pantoprazole (PROTONIX) 40 MG tablet Take 1 tablet (40 mg total) by mouth daily.   rosuvastatin (CRESTOR) 10 MG tablet TAKE ONE TABLET BY MOUTH EVERY EVENING   Semaglutide,0.25 or 0.5MG /DOS, (OZEMPIC, 0.25 OR 0.5 MG/DOSE,) 2 MG/1.5ML SOPN Inject 0.5 mg into the skin once a week.   valsartan-hydrochlorothiazide (DIOVAN-HCT) 160-12.5 MG tablet TAKE ONE TABLET BY MOUTH ONCE DAILY   vitamin B-12 (CYANOCOBALAMIN) 500 MCG tablet Take 500 mcg by mouth daily.    No facility-administered encounter medications on file as of 04/16/2021.   Recent Relevant Labs: Lab Results  Component Value Date/Time   HGBA1C 7.0 (H) 02/08/2021 11:38 AM   HGBA1C 6.2 (H) 12/21/2019 08:54 AM   MICROALBUR 2.2 (H) 02/08/2021 11:38 AM   MICROALBUR 1.5 12/21/2019 08:54 AM    Kidney Function Lab Results  Component Value Date/Time   CREATININE 1.05 02/08/2021 11:38 AM   CREATININE 0.97  12/21/2019 08:54 AM   CREATININE 1.03 06/20/2019 12:50 PM   GFR 54.94 (L) 02/08/2021 11:38 AM   GFRNONAA >60 05/12/2018 11:02 AM   GFRAA >60 05/12/2018 11:02 AM    Current antihyperglycemic regimen:  Glimiperide 4 mg daily Metformin 1000 mg twice daily  What recent interventions/DTPs have been made to improve glycemic control:  No recent interventions.  Have there been any recent hospitalizations or ED visits since last visit with CPP? No  Patient denies hypoglycemic symptoms.  Patient denies hyperglycemic symptoms.  How often are you checking your blood sugar? Patient states she is checking blood sugars once daily.  What are your blood sugars ranging?  Fasting: patient states her blood sugars are generally between 135-140.  She did not have the actual readings to list.  During the week, how often does your blood glucose drop below 70? Patient denies any readings below 70  Are you checking your feet daily/regularly? Patient does have a podiatrist and is checking her feet daily.  Adherence Review: Is the patient currently on a STATIN medication? Yes Is the patient currently on ACE/ARB medication? Yes Does the patient have >5 day gap between last estimated fill dates? No   Reviewed chart for medication changes ahead of medication coordination call.  No OVs, Consults, or hospital visits since last care coordination call/Pharmacist visit. (If appropriate, list visit date, provider name)  No medication changes indicated OR if recent visit,  treatment plan here.  BP Readings from Last 3 Encounters:  03/04/21 134/74  02/08/21 130/60  01/15/21 110/62    Lab Results  Component Value Date   HGBA1C 7.0 (H) 02/08/2021     Patient obtains medications through Adherence Packaging  30 Days    Last adherence delivery included: Valsartan-hydrochlorothiazide 160/12.5 mg One tablet daily (breakfast) Rosuvastatin 10mg  - take 1 tablet daily Metformin 1000 mg- 1 tablet twice daily  (Breakfast and evening meal) Glimepiride 4 mg One tablet daily (before breakfast) Metoprolol 50 mg One half tablet twice daily (Breakfast and evening meal) Folic acid 1 mg - 1 tablet at breakfast   Patient declined (meds) last month: Patient did not decline medication last month.   Patient is due for next adherence delivery on: 04/25/2021   Called patient and reviewed medications and coordinated delivery.   This delivery to include: Valsartan-hydrochlorothiazide 160/12.5 mg One tablet daily (breakfast) Rosuvastatin 10mg  - take 1 tablet daily Metformin 1000 mg- 1 tablet twice daily (Breakfast and evening meal) Glimepiride 4 mg One tablet daily (before breakfast) Metoprolol 50 mg One half tablet twice daily (Breakfast and evening meal) Folic acid 1 mg - 1 tablet at breakfast   Patient will need a short fill: Patient declined any short fills.   Coordinated acute fill: Patient declined any acute fills.   Patient declined the following medications: Patient did not decline any medications.   Confirmed delivery date of 04/25/2021 advised patient that pharmacy will contact them the morning of delivery.    Care Gaps: AWV - completed 01/15/2021 Last BP - 134/74 on 03/04/2021 Last HGA1C - 7.0 on 02/08/2021 Tetanus/TDAP - overdue Covid - overdue  Star Rating Drugs: Rosuvastatin 10 mg - last filled 03/20/2021 30DS at [facility] Januvia 100 mg - last filled 03/20/2021 30 DS at [facility] Valsartan HCTZ - last filled 03/20/2021 30DS at [facility] Metformin 1000 mg - last filled 03/20/2021 30DS at [facility] Glimepiride 4 mg - last filled 03/20/2021 30DS at [place] Pharmacist Assistant [phone]

## 2021-04-24 ENCOUNTER — Other Ambulatory Visit: Payer: Self-pay

## 2021-04-24 MED ORDER — OZEMPIC (0.25 OR 0.5 MG/DOSE) 2 MG/1.5ML ~~LOC~~ SOPN: 0.5000 mg | PEN_INJECTOR | SUBCUTANEOUS | 0 refills | Status: DC

## 2021-05-10 ENCOUNTER — Other Ambulatory Visit (HOSPITAL_COMMUNITY): Payer: Self-pay

## 2021-05-10 ENCOUNTER — Telehealth: Payer: Self-pay | Admitting: Family Medicine

## 2021-05-10 MED ORDER — OZEMPIC (0.25 OR 0.5 MG/DOSE) 2 MG/1.5ML ~~LOC~~ SOPN
0.5000 mg | PEN_INJECTOR | SUBCUTANEOUS | 3 refills | Status: DC
  Filled 2021-05-10: qty 1.5, 28d supply, fill #0

## 2021-05-10 NOTE — Telephone Encounter (Signed)
Patient is calling in to speak with Maddie about Ozempic.  Patient could be contacted at [phone].  Please advise.

## 2021-05-10 NOTE — Telephone Encounter (Signed)
Called patient back. Ozempic is on backorder again with [facility]. Plan to have [facility] transfer to [facility] outpatient pharmacy as they have some in stock. Unable to get in touch with [facility] to ask.  Patient is aware this will be transferred and to call in a few hours to inquire about pick up today.

## 2021-05-15 ENCOUNTER — Telehealth: Payer: Self-pay | Admitting: Pharmacist

## 2021-05-15 NOTE — Chronic Care Management (AMB) (Signed)
Chronic Care Management Pharmacy Assistant   Name: Andrea [name]  MRN: [id number] DOB: [birth year]  Andrea [name] is an 66 y.o. year old female who presents for his   Reason for Encounter: Medication Review / Medication Coordination Call   Conditions to be addressed/monitored: HTN  Recent office visits:  None  Recent consult visits:  None  Hospital visits:  None  Medications: Outpatient Encounter Medications as of 05/15/2021  Medication Sig   aspirin 81 MG tablet Take 81 mg by mouth daily.     cetirizine (ZYRTEC) 10 MG tablet Take 10 mg by mouth daily.   folic acid (FOLVITE) 1 MG tablet Take 1 tablet (1 mg total) by mouth daily.   glimepiride (AMARYL) 4 MG tablet TAKE ONE TABLET BY MOUTH BEFORE BREAKFAST   glucose blood (ONETOUCH ULTRA) test strip USE TO CHECH BLOOD SUGAR DAILY AND AS NEEDED   metFORMIN (GLUCOPHAGE) 1000 MG tablet TAKE ONE TABLET BY MOUTH TWICE DAILY AT 4am AND 6pm ON work DAYS, 7-8am AND 6pm ON non work DAYS   metoprolol tartrate (LOPRESSOR) 50 MG tablet TAKE 1/2 TABLET BY MOUTH TWICE DAILY   ONETOUCH DELICA LANCETS FINE MISC 1 Stick by Does not apply route daily.   pantoprazole (PROTONIX) 40 MG tablet Take 1 tablet (40 mg total) by mouth daily.   rosuvastatin (CRESTOR) 10 MG tablet TAKE ONE TABLET BY MOUTH EVERY EVENING   Semaglutide,0.25 or 0.5MG /DOS, (OZEMPIC, 0.25 OR 0.5 MG/DOSE,) 2 MG/1.5ML SOPN Inject 0.5 mg into the skin once a week.   Semaglutide,0.25 or 0.5MG /DOS, (OZEMPIC, 0.25 OR 0.5 MG/DOSE,) 2 MG/1.5ML SOPN Inject 0.5 mg into the skin once a week.   Semaglutide,0.25 or 0.5MG /DOS, (OZEMPIC, 0.25 OR 0.5 MG/DOSE,) 2 MG/1.5ML SOPN Inject 0.5 mg into the skin once a week.   valsartan-hydrochlorothiazide (DIOVAN-HCT) 160-12.5 MG tablet TAKE ONE TABLET BY MOUTH ONCE DAILY   vitamin B-12 (CYANOCOBALAMIN) 500 MCG tablet Take 500 mcg by mouth daily.    No facility-administered encounter medications on file as of 05/15/2021.  Reviewed chart for medication  changes ahead of medication coordination call.  No OVs, Consults, or hospital visits since last care coordination call/Pharmacist visit. (If appropriate, list visit date, provider name)  No medication changes indicated OR if recent visit, treatment plan here.  BP Readings from Last 3 Encounters:  03/04/21 134/74  02/08/21 130/60  01/15/21 110/62    Lab Results  Component Value Date   HGBA1C 7.0 (H) 02/08/2021     Patient obtains medications through Adherence Packaging  30 Days    Last adherence delivery included: Valsartan-hydrochlorothiazide 160/12.5 mg One tablet daily (breakfast) Rosuvastatin 10mg  - take 1 tablet daily Metformin 1000 mg- 1 tablet twice daily (Breakfast and evening meal) Glimepiride 4 mg One tablet daily (before breakfast) Metoprolol 50 mg One half tablet twice daily (Breakfast and evening meal) Folic acid 1 mg - 1 tablet at breakfast   Patient declined (meds) last month: Patient did not decline medication last month.   Patient is due for next adherence delivery on: 05/27/2021   Called patient and reviewed medications and coordinated delivery.   This delivery to include: Valsartan-hydrochlorothiazide 160/12.5 mg One tablet daily (breakfast) Rosuvastatin 10mg  - take 1 tablet daily Metformin 1000 mg- 1 tablet twice daily (Breakfast and evening meal) Glimepiride 4 mg One tablet daily (before breakfast) Metoprolol 25 mg One tablet twice daily (Breakfast and evening meal) Folic acid 1 mg - 1 tablet at breakfast   Patient will need a short fill: Patient  declined any short fills.   Coordinated acute fill: Patient declined any acute fills.   Patient declined the following medications: Patient did not decline any medications.   Confirmed delivery date of 05/27/2021 advised patient that pharmacy will contact them the morning of delivery.  Care Gaps: AWV - completed 01/15/2021 Last BP - 134/74 on 03/04/2021 Last HGA1C - 7.0 on 02/08/2021 Tetanus/TDAP -  overdue Covid - overdue  Star Rating Drugs: Rosuvastatin 10 mg - last filled 04/20/2021 30DS at [facility] Valsartan HCTZ - last filled 04/20/2021 30DS at [facility] Metformin 1000 mg - last filled 04/20/2021 30DS at [facility] Glimepiride 4 mg - last filled 04/20/2021 30DS at [place] Pharmacist Assistant [phone]

## 2021-05-20 ENCOUNTER — Encounter: Payer: Self-pay | Admitting: Family Medicine

## 2021-05-20 ENCOUNTER — Ambulatory Visit (INDEPENDENT_AMBULATORY_CARE_PROVIDER_SITE_OTHER): Payer: Medicare Other | Admitting: Family Medicine

## 2021-05-20 VITALS — BP 130/60 | HR 77 | Temp 98.6°F | Ht 63.0 in | Wt 151.0 lb

## 2021-05-20 DIAGNOSIS — E785 Hyperlipidemia, unspecified: Secondary | ICD-10-CM

## 2021-05-20 DIAGNOSIS — Z87891 Personal history of nicotine dependence: Secondary | ICD-10-CM

## 2021-05-20 DIAGNOSIS — E1140 Type 2 diabetes mellitus with diabetic neuropathy, unspecified: Secondary | ICD-10-CM

## 2021-05-20 DIAGNOSIS — E538 Deficiency of other specified B group vitamins: Secondary | ICD-10-CM | POA: Diagnosis not present

## 2021-05-20 DIAGNOSIS — R5383 Other fatigue: Secondary | ICD-10-CM

## 2021-05-20 DIAGNOSIS — I10 Essential (primary) hypertension: Secondary | ICD-10-CM | POA: Diagnosis not present

## 2021-05-20 DIAGNOSIS — E1169 Type 2 diabetes mellitus with other specified complication: Secondary | ICD-10-CM | POA: Diagnosis not present

## 2021-05-20 DIAGNOSIS — E1159 Type 2 diabetes mellitus with other circulatory complications: Secondary | ICD-10-CM

## 2021-05-20 LAB — CBC WITH DIFFERENTIAL/PLATELET
Basophils Absolute: 0.1 10*3/uL (ref 0.0–0.1)
Basophils Relative: 1.2 % (ref 0.0–3.0)
Eosinophils Absolute: 1 10*3/uL — ABNORMAL HIGH (ref 0.0–0.7)
Eosinophils Relative: 10.5 % — ABNORMAL HIGH (ref 0.0–5.0)
HCT: 35.6 % — ABNORMAL LOW (ref 36.0–46.0)
Hemoglobin: 12.2 g/dL (ref 12.0–15.0)
Lymphocytes Relative: 15.9 % (ref 12.0–46.0)
Lymphs Abs: 1.5 10*3/uL (ref 0.7–4.0)
MCHC: 34.2 g/dL (ref 30.0–36.0)
MCV: 91.9 fl (ref 78.0–100.0)
Monocytes Absolute: 0.5 10*3/uL (ref 0.1–1.0)
Monocytes Relative: 4.9 % (ref 3.0–12.0)
Neutro Abs: 6.5 10*3/uL (ref 1.4–7.7)
Neutrophils Relative %: 67.5 % (ref 43.0–77.0)
Platelets: 239 10*3/uL (ref 150.0–400.0)
RBC: 3.88 Mil/uL (ref 3.87–5.11)
RDW: 14.8 % (ref 11.5–15.5)
WBC: 9.7 10*3/uL (ref 4.0–10.5)

## 2021-05-20 LAB — FOLATE: Folate: >24.2 ng/mL (ref >5.9)

## 2021-05-20 LAB — POCT GLYCOSYLATED HEMOGLOBIN (HGB A1C): Hemoglobin A1C: 7 % — AB (ref 4.0–5.6)

## 2021-05-20 LAB — COMPREHENSIVE METABOLIC PANEL
ALT: 9 U/L (ref 0–35)
AST: 14 U/L (ref 0–37)
Albumin: 4.1 g/dL (ref 3.5–5.2)
Alkaline Phosphatase: 52 U/L (ref 39–117)
BUN: 19 mg/dL (ref 6–23)
CO2: 28 mEq/L (ref 19–32)
Calcium: 10.3 mg/dL (ref 8.4–10.5)
Chloride: 100 mEq/L (ref 96–112)
Creatinine, Ser: 0.91 mg/dL (ref 0.40–1.20)
GFR: 65.11 mL/min (ref >60.00)
Glucose, Bld: 119 mg/dL — ABNORMAL HIGH (ref 70–99)
Potassium: 4.3 mEq/L (ref 3.5–5.1)
Sodium: 136 mEq/L (ref 135–145)
Total Bilirubin: 0.3 mg/dL (ref 0.2–1.2)
Total Protein: 7.2 g/dL (ref 6.0–8.3)

## 2021-05-20 LAB — VITAMIN B12: Vitamin B-12: >1504 pg/mL — ABNORMAL HIGH (ref 211–911)

## 2021-05-20 NOTE — Patient Instructions (Addendum)
Try melatonin - for sleep. There is short acting, but I might recommend trying the sustained release. Let me know if that doesn't work.   Decrease ozempic to 0.25mg  and then let me know after next 2 doses how the nausea does. If not better we will change meds overall.   I will talk with Maddie and see what other med options we have for you.

## 2021-05-20 NOTE — Progress Notes (Addendum)
Andrea [name] DOB: 1955/07/30 Encounter date: 05/20/2021  This is a 66 y.o. female who presents with Chief Complaint  Patient presents with   Follow-up   Nausea    Patient complains of nausea since starting Ozempic    History of present illness:  Last rx for ozempic was over $400 and she couldn't afford that. In December couldn't really tell she was nauseated because she had bad cold. Rodman Pickle was gone, then she started to notice nausea more. Took grandson to [facility] and felt so sick she had to drive with window down. Bowels have been looser too.   She got toenails clipped with podiatry. She is now set up to have visits every 3 months with them.  [facility]; [facility] but [facility] were out of [facility]pic. Indigestion med from [place].   At night time she is full; not wanting to eat anything. She does get good breakfast in, but not much lunch or dinner. Sometimes small snack like carrots and ranch in evening. Sometimes grapes in evening.   Not sure if it is cold or not wanting to get out. Not sleeping well at night. Waking up 2 times/night to go to the bathroom. After breakfast just wants to come home and go to sleep. Doesn't feel like it is mood related.   Does feel tired after taking medication. Has had issues with sleep since working 3rd shift.   HTN: not checking regularly, but will occasionally. Does get 140's at home some time. She gets diastolic in 27-25'D. Diovan-hct 160-12.5. lopressor 25 bid.   Breathing is doing better now. She had terrible cough for a long time.  Hasn't been taking protonix regularly. Just if having symptoms. Tends to be more nauseated in afternoon.  Last ozempic shot was Friday morning. Nausea does improve as she nears next shot.  We did order lung cancer screening in the past, but she did not follow through this.  She is willing to do it again an order will be placed today.  Allergies  Allergen Reactions   Plavix [Clopidogrel Bisulfate]      Caused platelets to drop   Current Meds  Medication Sig   aspirin 81 MG tablet Take 81 mg by mouth daily.     cetirizine (ZYRTEC) 10 MG tablet Take 10 mg by mouth daily.   folic acid (FOLVITE) 1 MG tablet Take 1 tablet (1 mg total) by mouth daily.   glimepiride (AMARYL) 4 MG tablet TAKE ONE TABLET BY MOUTH BEFORE BREAKFAST   glucose blood (ONETOUCH ULTRA) test strip USE TO CHECH BLOOD SUGAR DAILY AND AS NEEDED   metFORMIN (GLUCOPHAGE) 1000 MG tablet TAKE ONE TABLET BY MOUTH TWICE DAILY AT 4am AND 6pm ON work DAYS, 7-8am AND 6pm ON non work DAYS   metoprolol tartrate (LOPRESSOR) 50 MG tablet TAKE 1/2 TABLET BY MOUTH TWICE DAILY   ONETOUCH DELICA LANCETS FINE MISC 1 Stick by Does not apply route daily.   pantoprazole (PROTONIX) 40 MG tablet Take 1 tablet (40 mg total) by mouth daily.   rosuvastatin (CRESTOR) 10 MG tablet TAKE ONE TABLET BY MOUTH EVERY EVENING   Semaglutide,0.25 or 0.5MG /DOS, (OZEMPIC, 0.25 OR 0.5 MG/DOSE,) 2 MG/1.5ML SOPN Inject 0.5 mg into the skin once a week.   Semaglutide,0.25 or 0.5MG /DOS, (OZEMPIC, 0.25 OR 0.5 MG/DOSE,) 2 MG/1.5ML SOPN Inject 0.5 mg into the skin once a week.   Semaglutide,0.25 or 0.5MG /DOS, (OZEMPIC, 0.25 OR 0.5 MG/DOSE,) 2 MG/1.5ML SOPN Inject 0.5 mg into the  skin once a week.   valsartan-hydrochlorothiazide (DIOVAN-HCT) 160-12.5 MG tablet TAKE ONE TABLET BY MOUTH ONCE DAILY   vitamin B-12 (CYANOCOBALAMIN) 500 MCG tablet Take 500 mcg by mouth daily.     Review of Systems  Constitutional:  Negative for chills, fatigue and fever.  Respiratory:  Negative for cough, chest tightness, shortness of breath and wheezing.   Cardiovascular:  Negative for chest pain, palpitations and leg swelling.  Psychiatric/Behavioral:  Positive for sleep disturbance.    Objective:  BP 130/60 (BP Location: Left Arm, Cuff Size: Normal)    Pulse 77    Temp 98.6 F (37 C) (Oral)    Ht 5\' 3"  (1.6 m)    Wt 151 lb (68.5 kg)    SpO2 95%    BMI 26.75 kg/m   Weight: 151 lb  (68.5 kg)   BP Readings from Last 3 Encounters:  05/20/21 130/60  03/04/21 134/74  02/08/21 130/60   Wt Readings from Last 3 Encounters:  05/20/21 151 lb (68.5 kg)  02/08/21 154 lb 3.2 oz (69.9 kg)  01/15/21 153 lb 12.8 oz (69.8 kg)    Physical Exam Constitutional:      General: She is not in acute distress.    Appearance: She is well-developed.  Cardiovascular:     Rate and Rhythm: Normal rate and regular rhythm.     Heart sounds: Normal heart sounds. No murmur heard.   No friction rub.  Pulmonary:     Effort: Pulmonary effort is normal. No respiratory distress.     Breath sounds: Normal breath sounds. No wheezing or rales.  Musculoskeletal:     Right lower leg: No edema.     Left lower leg: No edema.  Neurological:     Mental Status: She is alert and oriented to person, place, and time.  Psychiatric:        Behavior: Behavior normal.    Assessment/Plan  1. Hypertension associated with diabetes ([place]) Blood pressure looks okay.  Continue with Diovan 160-12.5 daily, metoprolol 25 mg twice daily. - CBC with Differential/Platelet; Future - Comprehensive metabolic panel; Future  2. Type 2 diabetes mellitus with diabetic neuropathy, without long-term current use of insulin ([place]) She is having difficult time with tolerating the Ozempic due to nausea.  We are going to back down to the 0.25 mg weekly dose and see if she does better with this.  Going to send message to Maddie to check in with her in a couple weeks and see if this helps with symptoms.  We can determine medication change if needed at that time.  We discussed stopping the glimepiride due to age, and she is okay with this, but I would like to make sure we have something else in place to help control blood sugars. - POC HgB A1c  3. Hyperlipidemia associated with type 2 diabetes mellitus ([place]) Continue with Crestor 10 mg daily.  4. History of tobacco abuse - Ambulatory Referral for Lung Cancer Scre  5. Fatigue,  unspecified type She discussed difficulty with sleep.  She is to work third shift and has a difficult time falling asleep.  We discussed trial of melatonin to help with regulating sleep and wake cycles.  We discussed staying on track with time to go to bed and get up in order to get cycle back on track.  Let me know if the melatonin is not working.  6. B12 deficiency She has been taking B12 and folate.  Recheck levels since she was previously deficient. -  Vitamin B12; Future  7. Folate deficiency - Folate; Future    Return in about 3 months (around 08/17/2021) for Chronic condition visit. We will check in with her in a couple weeks to see how she is tolerating the lower dose of Ozempic.  Medication adjustment pending this report.    [name], MD

## 2021-06-05 ENCOUNTER — Other Ambulatory Visit: Payer: Self-pay

## 2021-06-05 ENCOUNTER — Encounter: Payer: Self-pay | Admitting: Podiatry

## 2021-06-05 ENCOUNTER — Ambulatory Visit: Payer: Medicare Other | Admitting: Podiatry

## 2021-06-05 DIAGNOSIS — E0843 Diabetes mellitus due to underlying condition with diabetic autonomic (poly)neuropathy: Secondary | ICD-10-CM

## 2021-06-05 DIAGNOSIS — M79674 Pain in right toe(s): Secondary | ICD-10-CM | POA: Diagnosis not present

## 2021-06-05 DIAGNOSIS — M79675 Pain in left toe(s): Secondary | ICD-10-CM | POA: Diagnosis not present

## 2021-06-05 DIAGNOSIS — B351 Tinea unguium: Secondary | ICD-10-CM | POA: Diagnosis not present

## 2021-06-05 NOTE — Progress Notes (Signed)
This patient returns to my office for at risk foot care.  This patient requires this care by a professional since this patient will be at risk due to having diabetes.  This patient is unable to cut nails herself since the patient cannot reach her nails.These nails are painful walking and wearing shoes.  This patient presents for at risk foot care today.  General Appearance  Alert, conversant and in no acute stress.  Vascular  Dorsalis pedis and posterior tibial  pulses are palpable  bilaterally.  Capillary return is within normal limits  bilaterally. Temperature is within normal limits  bilaterally.  Neurologic  Senn-Weinstein monofilament wire test within normal limits  bilaterally. Muscle power within normal limits bilaterally.  Nails Thick disfigured discolored nails with subungual debris  from hallux to fifth toes bilaterally. No evidence of bacterial infection or drainage bilaterally.Pincer hallux right toenail.  Orthopedic  No limitations of motion  feet .  No crepitus or effusions noted.  No bony pathology or digital deformities noted.  Skin  normotropic skin with no porokeratosis noted bilaterally.  No signs of infections or ulcers noted.     Onychomycosis  Pain in right toes  Pain in left toes  Consent was obtained for treatment procedures.   Mechanical debridement of nails 1-5  bilaterally performed with a nail nipper.  Filed with dremel without incident.    Return office visit    3 months                 Told patient to return for periodic foot care and evaluation due to potential at risk complications.   Gardiner Barefoot DPM

## 2021-06-11 ENCOUNTER — Other Ambulatory Visit: Payer: Self-pay | Admitting: Family Medicine

## 2021-06-11 ENCOUNTER — Telehealth: Payer: Self-pay | Admitting: Pharmacist

## 2021-06-11 NOTE — Telephone Encounter (Signed)
Called patient to check back in on nausea with the Ozempic. Patient reports the nausea has gone away with being on the 0.25 mg dose. Patient also reports she only has 1 dose left in the pen before she runs out.  Called [facility] but was on hold for 25 minutes and unable to talk to anyone. Will try again tomorrow.

## 2021-06-17 ENCOUNTER — Telehealth: Payer: Self-pay | Admitting: Pharmacist

## 2021-06-17 ENCOUNTER — Other Ambulatory Visit (HOSPITAL_COMMUNITY): Payer: Self-pay

## 2021-06-17 NOTE — Chronic Care Management (AMB) (Signed)
? ? ?Chronic Care Management ?Pharmacy Assistant  ? ?Name: Andrea [name]  MRN: [id number] DOB: 1956/01/20 ? ?Reason for Encounter: Medication Review / Medication Coordination Call ?  ?Conditions to be addressed/monitored: ?HTN ? ?Recent office visits:  ?None ? ?Recent consult visits:  ?None ? ?Hospital visits:  ?None ? ?Medications: ?Outpatient Encounter Medications as of 06/17/2021  ?Medication Sig  ? aspirin 81 MG tablet Take 81 mg by mouth daily.    ? cetirizine (ZYRTEC) 10 MG tablet Take 10 mg by mouth daily.  ? folic acid (FOLVITE) 1 MG tablet Take 1 tablet (1 mg total) by mouth daily.  ? glimepiride (AMARYL) 4 MG tablet TAKE 1 TABLET(4 MG) BY MOUTH DAILY BEFORE BREAKFAST  ? glucose blood (ONETOUCH ULTRA) test strip USE TO CHECH BLOOD SUGAR DAILY AND AS NEEDED  ? metFORMIN (GLUCOPHAGE) 1000 MG tablet TAKE ONE TABLET BY MOUTH TWICE DAILY AT 4am AND 6pm ON work DAYS, 7-8am AND 6pm ON non work DAYS  ? metoprolol tartrate (LOPRESSOR) 50 MG tablet TAKE 1/2 TABLET BY MOUTH TWICE DAILY  ? ONETOUCH DELICA LANCETS FINE MISC 1 Stick by Does not apply route daily.  ? pantoprazole (PROTONIX) 40 MG tablet Take 1 tablet (40 mg total) by mouth daily.  ? rosuvastatin (CRESTOR) 10 MG tablet TAKE ONE TABLET BY MOUTH EVERY EVENING  ? Semaglutide,0.25 or 0.'5MG'$ /DOS, (OZEMPIC, 0.25 OR 0.5 MG/DOSE,) 2 MG/1.5ML SOPN Inject 0.5 mg into the skin once a week.  ? Semaglutide,0.25 or 0.'5MG'$ /DOS, (OZEMPIC, 0.25 OR 0.5 MG/DOSE,) 2 MG/1.5ML SOPN Inject 0.5 mg into the skin once a week.  ? Semaglutide,0.25 or 0.'5MG'$ /DOS, (OZEMPIC, 0.25 OR 0.5 MG/DOSE,) 2 MG/1.5ML SOPN Inject 0.5 mg into the skin once a week.  ? valsartan-hydrochlorothiazide (DIOVAN-HCT) 160-12.5 MG tablet TAKE ONE TABLET BY MOUTH ONCE DAILY  ? vitamin B-12 (CYANOCOBALAMIN) 500 MCG tablet Take 500 mcg by mouth daily.   ? ?No facility-administered encounter medications on file as of 06/17/2021.  ? ?Reviewed chart for medication changes ahead of medication coordination call. ? ?No OVs,  Consults, or hospital visits since last care coordination call/Pharmacist visit. (If appropriate, list visit date, provider name) ? ?No medication changes indicated OR if recent visit, treatment plan here. ? ?BP Readings from Last 3 Encounters:  ?05/20/21 130/60  ?03/04/21 134/74  ?02/08/21 130/60  ?  ?Lab Results  ?Component Value Date  ? HGBA1C 7.0 (A) 05/20/2021  ?  ? ?Patient obtains medications through Adherence Packaging  30 Days  ?  ?Last adherence delivery included: ?Valsartan-hydrochlorothiazide 160/12.5 mg One tablet daily (breakfast) ?Rosuvastatin '10mg'$  - take 1 tablet daily ?Metformin 1000 mg- 1 tablet twice daily (Breakfast and evening meal) ?Glimepiride 4 mg One tablet daily (before breakfast) ?Metoprolol 25 mg One tablet twice daily (Breakfast and evening meal) ?Folic acid 1 mg - 1 tablet at breakfast ? ?Patient declined (meds) last month: Patient did not decline medication last month. ? ?  ?Patient is due for next adherence delivery on: 06/27/2021 ?  ?Called patient and reviewed medications and coordinated delivery. ?  ?This delivery to include: ?Valsartan-hydrochlorothiazide 160/12.5 mg One tablet daily (breakfast) ?Rosuvastatin '10mg'$  - take 1 tablet daily with dinner ?Metformin 1000 mg- 1 tablet twice daily (Breakfast and evening meal) ?Glimepiride 4 mg One tablet daily (before breakfast) ?Metoprolol 25 mg One tablet twice daily (Breakfast and evening meal) ?Folic acid 1 mg - 1 tablet at breakfast ?  ?Patient will need a short fill: Patient declined any short fills. ?  ?Coordinated acute fill: Patient declined any  acute fills. ?  ?Patient declined the following medications: Patient did not decline any medications. ?  ?Confirmed delivery date of 06/27/2021 advised patient that pharmacy will contact them the morning of delivery. ? ?Care Gaps: ?AWV - scheduled for 01/28/2022 ?Last BP - 130/60 on 05/20/2021 ?Last A1C - 7.0 on 05/20/2021 ?Covid - overdue ?  ?Star Rating Drugs: ?Rosuvastatin 10 mg - last  filled 05/21/2021 30DS at [facility] ?Valsartan HCTZ - last filled 05/21/2021 30DS at [facility] ?Metformin 1000 mg - last filled 05/21/2021 30DS at [facility] ?Glimepiride 4 mg - last filled 02/013/2023 30DS at [facility] ? ?Gennie Alma CMA  ?Clinical Pharmacist Assistant ?[phone] ? ?

## 2021-06-18 NOTE — Telephone Encounter (Signed)
Called patient again as I was unable to confirm that [facility] had any Ozempic in stock. [facility]  ? ?Will route to PCP to make her aware and to see if we can slowly titrate up on Ozempic to see how patient tolerates. ?

## 2021-06-18 NOTE — Telephone Encounter (Signed)
That sounds great if she is willing to try that increase and tolerating the lower dose.  ?

## 2021-06-19 NOTE — Telephone Encounter (Signed)
Called patient to have her increase Ozempic on Friday to roughly half way between 0.25 mg 0.5 mg dose on the pen. Plan to continue with this dose for a few weeks and will follow up with CCM appt on 3/21 as scheduled. ?

## 2021-06-20 DIAGNOSIS — H52203 Unspecified astigmatism, bilateral: Secondary | ICD-10-CM | POA: Diagnosis not present

## 2021-06-20 DIAGNOSIS — Z961 Presence of intraocular lens: Secondary | ICD-10-CM | POA: Diagnosis not present

## 2021-06-20 DIAGNOSIS — E119 Type 2 diabetes mellitus without complications: Secondary | ICD-10-CM | POA: Diagnosis not present

## 2021-06-20 DIAGNOSIS — H524 Presbyopia: Secondary | ICD-10-CM | POA: Diagnosis not present

## 2021-06-20 DIAGNOSIS — Z7984 Long term (current) use of oral hypoglycemic drugs: Secondary | ICD-10-CM | POA: Diagnosis not present

## 2021-06-20 DIAGNOSIS — H5211 Myopia, right eye: Secondary | ICD-10-CM | POA: Diagnosis not present

## 2021-06-20 LAB — HM DIABETES EYE EXAM

## 2021-06-26 ENCOUNTER — Encounter: Payer: Self-pay | Admitting: Family Medicine

## 2021-07-01 ENCOUNTER — Telehealth: Payer: Self-pay | Admitting: Pharmacist

## 2021-07-01 NOTE — Chronic Care Management (AMB) (Signed)
? ? ?  Chronic Care Management ?Pharmacy Assistant  ? ?Name: Andrea [name]  MRN: [id number] DOB: Apr 28, 1955 ? ?07/02/2021 APPOINTMENT REMINDER ? ? ?Called Druscilla D Andreen, No answer, left message of appointment on 07/02/2021 at 2:00 via telephone visit with [name], Pharm D. Notified to have all medications, supplements, blood pressure and/or blood sugar logs available during appointment and to return call if need to reschedule. ? ? ?Care Gaps: ?AWV - scheduled for 01/28/2022 ?Last BP - 130/60 on 05/20/2021 ?Last A1C - 7.0 on 05/20/2021 ?Covid - overdue ?  ?Star Rating Drugs: ?Rosuvastatin 10 mg - last filled 05/21/2021 30DS at [facility] ?Valsartan HCTZ - last filled 05/21/2021 30[facility] at [facility] ?Metformin 1000 mg - last filled 05/21/2021 30[facility] at [facility] ?Glimepiride 4 mg - last filled 05/21/2021 30DS at [facility] ? ? ?Any gaps in medications fill history? No, in process with [facility] 06/17/2021 ? ?Gennie Alma CMA  ?Clinical Pharmacist Assistant ?[phone] ? ?

## 2021-07-02 ENCOUNTER — Ambulatory Visit (INDEPENDENT_AMBULATORY_CARE_PROVIDER_SITE_OTHER): Payer: Medicare Other | Admitting: Pharmacist

## 2021-07-02 DIAGNOSIS — I152 Hypertension secondary to endocrine disorders: Secondary | ICD-10-CM

## 2021-07-02 DIAGNOSIS — E1140 Type 2 diabetes mellitus with diabetic neuropathy, unspecified: Secondary | ICD-10-CM

## 2021-07-02 NOTE — Progress Notes (Signed)
? ?Chronic Care Management ?Pharmacy Note ? ?07/02/2021 ?Name:  Andrea [name] MRN:  [id number] DOB:  Aug 26, 1955 ? ?Summary: ?A1c not quite at goal < 7%  ?  ?Recommendations/Changes made from today's visit: ?-Recommend repeat A1c at visit in May and consider increasing Ozempic to 1 mg (as patient tolerates) and stopping glimepiride ?-Recommended for pt to log blood sugar readings to see if there a trends in times of day ?-Recommended restarted weekly BP monitoring at home ?  ?Plan: ?Follow up after PCP visit in early May ? ?Subjective: ?Andrea [name] is an 66 y.o. year old female who is a primary patient of Koberlein, Steele Berg, MD.  The CCM team was consulted for assistance with disease management and care coordination needs.   ? ?Engaged with patient by telephone for follow up visit in response to provider referral for pharmacy case management and/or care coordination services.  ? ?Consent to Services:  ?The patient was given information about Chronic Care Management services, agreed to services, and gave verbal consent prior to initiation of services.  Please see initial visit note for detailed documentation.  ? ?Patient Care Team: ?Caren Macadam, MD as PCP - General (Family Medicine) ?Rutherford Guys, MD as Consulting Physician (Ophthalmology) ?[name], [facility] as Pharmacist (Pharmacist) ? ?Recent office visits: ?05/20/21 [name], MD: Patient presented for chronic conditions follow up and nausea. Decreased back to 0.25 mg on Ozempic due to nausea. Referred for annual low dose CT scan. Recommended melatonin for sleep. Decreased vitamin B12 supplement. ? ?02/08/21 [name], MD: Patient presented for annual exam. A1c increased to 7.0%. Referred to podiatry. Prescribed pantoprazole 40 mg daily. ? ?01/15/21 [name], LPN: Patient presented for AWV. ? ?Recent consult visits: ?06/20/21 Rutherford Guys, MD ([facility]): Patient presented for diabetic eye exam.  ? ?06/05/21 Gardiner Barefoot, DPM  ([facility]): Patient presented for nail trim. ? ?Hospital visits: ?None in previous 6 months ? ?Objective: ? ?Lab Results  ?Component Value Date  ? CREATININE 0.91 05/20/2021  ? BUN 19 05/20/2021  ? GFR 65.11 05/20/2021  ? GFRNONAA >60 05/12/2018  ? GFRAA >60 05/12/2018  ? NA 136 05/20/2021  ? K 4.3 05/20/2021  ? CALCIUM 10.3 05/20/2021  ? CO2 28 05/20/2021  ? GLUCOSE 119 (H) 05/20/2021  ? ? ?Lab Results  ?Component Value Date/Time  ? HGBA1C 7.0 (A) 05/20/2021 09:02 AM  ? HGBA1C 7.0 (H) 02/08/2021 11:38 AM  ? HGBA1C 6.2 (H) 12/21/2019 08:54 AM  ? GFR 65.11 05/20/2021 09:48 AM  ? GFR 54.94 (L) 02/08/2021 11:38 AM  ? MICROALBUR 2.2 (H) 02/08/2021 11:38 AM  ? MICROALBUR 1.5 12/21/2019 08:54 AM  ?  ?Last diabetic Eye exam:  ?Lab Results  ?Component Value Date/Time  ? HMDIABEYEEXA No Retinopathy 06/20/2021 12:00 AM  ?  ?Last diabetic Foot exam:  ?Lab Results  ?Component Value Date/Time  ? HMDIABFOOTEX done 07/01/2009 12:00 AM  ?  ? ?Lab Results  ?Component Value Date  ? CHOL 114 02/08/2021  ? HDL 29.40 (L) 02/08/2021  ? LDLCALC 46 12/21/2019  ? LDLDIRECT 56.0 02/08/2021  ? TRIG 264.0 (H) 02/08/2021  ? CHOLHDL 4 02/08/2021  ? ? ?Hepatic Function Latest Ref Rng & Units 05/20/2021 02/08/2021 12/21/2019  ?Total Protein 6.0 - 8.3 g/dL 7.2 7.4 6.9  ?Albumin 3.5 - 5.2 g/dL 4.1 4.3 -  ?AST 0 - 37 U/L 14 13 19   ?ALT 0 - 35 U/L 9 8 8   ?Alk Phosphatase 39 - 117 U/L 52  47 -  ?Total Bilirubin 0.2 - 1.2 mg/dL 0.3 0.4 0.2  ?Bilirubin, Direct 0.0 - 0.3 mg/dL - - -  ? ? ?Lab Results  ?Component Value Date/Time  ? TSH 3.35 02/08/2021 11:38 AM  ? TSH 3.29 12/21/2019 08:54 AM  ? ? ?CBC Latest Ref Rng & Units 05/20/2021 02/08/2021 12/21/2019  ?WBC 4.0 - 10.5 K/uL 9.7 6.2 7.4  ?Hemoglobin 12.0 - 15.0 g/dL 12.2 12.6 12.6  ?Hematocrit 36.0 - 46.0 % 35.6(L) 37.0 37.2  ?Platelets 150.0 - 400.0 K/uL 239.0 265.0 259  ? ? ?Lab Results  ?Component Value Date/Time  ? VD25OH 35 12/21/2019 08:54 AM  ? ? ?Clinical ASCVD: Yes  ?The ASCVD Risk score  (Arnett DK, et al., 2019) failed to calculate for the following reasons: ?  The valid total cholesterol range is 130 to 320 mg/dL   ? ?Depression screen [facility] 2/9 05/20/2021 01/15/2021 01/10/2020  ?Decreased Interest 2 0 0  ?Down, Depressed, Hopeless 0 0 0  ?PHQ - 2 Score 2 0 0  ?Altered sleeping 2 - -  ?Tired, decreased energy 2 - -  ?Change in appetite 2 - -  ?Feeling bad or failure about yourself  2 - -  ?Trouble concentrating 0 - -  ?Moving slowly or fidgety/restless 0 - -  ?Suicidal thoughts 0 - -  ?PHQ-9 Score 10 - -  ?  ? ?Social History  ? ?Tobacco Use  ?Smoking Status Former  ? Types: E-cigarettes  ? Quit date: 06/20/2010  ? Years since quitting: 11.0  ?Smokeless Tobacco Never  ?Tobacco Comments  ? smoked 1ppd for> 30 years/smokes occasionally,some vapor cigarettes  ? ?BP Readings from Last 3 Encounters:  ?05/20/21 130/60  ?03/04/21 134/74  ?02/08/21 130/60  ? ?Pulse Readings from Last 3 Encounters:  ?05/20/21 77  ?03/04/21 71  ?02/08/21 65  ? ?Wt Readings from Last 3 Encounters:  ?05/20/21 151 lb (68.5 kg)  ?02/08/21 154 lb 3.2 oz (69.9 kg)  ?01/15/21 153 lb 12.8 oz (69.8 kg)  ? ?BMI Readings from Last 3 Encounters:  ?05/20/21 26.75 kg/m?  ?02/08/21 27.32 kg/m?  ?01/15/21 26.61 kg/m?  ? ? ?Assessment/Interventions: Review of patient past medical history, allergies, medications, health status, including review of consultants reports, laboratory and other test data, was performed as part of comprehensive evaluation and provision of chronic care management services.  ? ?SDOH:  (Social Determinants of Health) assessments and interventions performed: No ? ?SDOH Screenings  ? ?Alcohol Screen: Not on file  ?Depression (PHQ2-9): Medium Risk  ? PHQ-2 Score: 10  ?Financial Resource Strain: Low Risk   ? Difficulty of Paying Living Expenses: Not hard at all  ?Food Insecurity: No Food Insecurity  ? Worried About Charity fundraiser in the Last Year: Never true  ? Ran Out of Food in the Last Year: Never true  ?Housing: Low Risk    ? Last Housing Risk Score: 0  ?Physical Activity: Insufficiently Active  ? Days of Exercise per Week: 3 days  ? Minutes of Exercise per Session: 20 min  ?Social Connections: Socially Isolated  ? Frequency of Communication with Friends and Family: Three times a week  ? Frequency of Social Gatherings with Friends and Family: More than three times a week  ? Attends Religious Services: Never  ? Active Member of Clubs or Organizations: No  ? Attends Archivist Meetings: Never  ? Marital Status: Divorced  ?Stress: No Stress Concern Present  ? Feeling of Stress : Not at all  ?Tobacco Use:  Medium Risk  ? Smoking Tobacco Use: Former  ? Smokeless Tobacco Use: Never  ? Passive Exposure: Not on file  ?Transportation Needs: No Transportation Needs  ? Lack of Transportation (Medical): No  ? Lack of Transportation (Non-Medical): No  ? ? ?CCM Care Plan ? ?Allergies  ?Allergen Reactions  ? Plavix [Clopidogrel Bisulfate]   ?  Caused platelets to drop  ? ? ?Medications Reviewed Today   ? ? Reviewed by [name], [place] (Pharmacist) on 07/02/21 at 1445  Med List Status: <None>  ? ?Medication Order Taking? Sig Documenting Provider Last Dose Status Informant  ?aspirin 81 MG tablet [id number] No Take 81 mg by mouth daily.   [provider] Taking Active Self  ?cetirizine (ZYRTEC) 10 MG tablet [id number] No Take 10 mg by mouth daily. [provider] Taking Active Self  ?folic acid (FOLVITE) 1 MG tablet [id number] No Take 1 tablet (1 mg total) by mouth daily. Caren Macadam, MD Taking Active   ?glimepiride (AMARYL) 4 MG tablet [id number]  TAKE 1 TABLET(4 MG) BY MOUTH DAILY BEFORE BREAKFAST Koberlein, Junell C, MD  Active   ?glucose blood (ONETOUCH ULTRA) test strip [id number] No USE TO [facility] BLOOD SUGAR DAILY AND AS NEEDED Koberlein, Junell C, MD Taking Active   ?metFORMIN (GLUCOPHAGE) 1000 MG tablet [id number] No TAKE ONE TABLET BY MOUTH TWICE DAILY AT 4am AND 6pm ON work DAYS, 7-8am AND 6pm ON non work  DAYS Koberlein, Steele Berg, MD Taking Active   ?metoprolol tartrate (LOPRESSOR) 50 MG tablet [id number]  TAKE 1/2 TABLET BY MOUTH TWICE DAILY Koberlein, Steele Berg, MD  Active   ?[place] LANCETS FINE MI

## 2021-07-02 NOTE — Patient Instructions (Signed)
Hi Shalanda, ? ?It was great to catch up again! I am so glad that you aren't having any nausea with the Ozempic again and that your readings are coming down. Please let me know if the nausea does return prior to your visit with Dr. Ethlyn Gallery in May. ? ?Please reach out to me if you have any questions or need anything! ? ?Best, ?Maddie ? ?[name], PharmD, BCACP ?Clinical Pharmacist ?Therapist, music at [place] ?[phone] ? ? Visit Information ? ? Goals Addressed   ?None ?  ? ?Patient Care Plan: [place]  ?  ? ?Problem Identified: Problem: Hypertension, Hyperlipidemia, Diabetes and vitamin B12 deficiency   ?  ? ?Long-Range Goal: Patient-Specific Goal   ?Start Date: 07/17/2020  ?Expected End Date: 07/17/2021  ?Recent Progress: On track  ?Priority: High  ?Note:   ?Current Barriers:  ?Unable to independently monitor therapeutic efficacy ? ?Pharmacist Clinical Goal(s):  ?Patient will achieve adherence to monitoring guidelines and medication adherence to achieve therapeutic efficacy through collaboration with PharmD and provider.  ? ?Interventions: ?1:1 collaboration with Caren Macadam, MD regarding development and update of comprehensive plan of care as evidenced by provider attestation and co-signature ?Inter-disciplinary care team collaboration (see longitudinal plan of care) ?Comprehensive medication review performed; medication list updated in electronic medical record ? ?Hypertension (BP goal <130/80) ?-Controlled ?-Current treatment: ?metoprolol tartrate (Lopressor) '50mg'$ , 0.5 (one-half) tablet twice daily - Appropriate, Effective, Safe, Accessible ?valsartan/HCTZ 160/12.'5mg'$ , 1 tablet once daily - Appropriate, Effective, Safe, Accessible ?-Medications previously tried: Scientist, water quality [employer] formulary) ?-Current home readings: not checking consistently ?-Current dietary habits: only using salt when eating tomato sandwiches; does not use on any thing else ?-Current exercise habits: not walking  consistently ?-Denies hypotensive/hypertensive symptoms ?-Educated on Exercise goal of 150 minutes per week; ?Importance of home blood pressure monitoring; ?-Counseled to monitor BP at home weekly, document, and provide log at future appointments ?-Counseled on diet and exercise extensively ?Recommended to continue current medication ? ?Hyperlipidemia/coronary atherosclerosis: (LDL goal < 70) ?-Controlled ?-Current treatment: ?rosuvastatin (Crestor) '10mg'$ , 1 tablet once daily - Appropriate, Effective, Safe, Accessible ?ASA '81mg'$ , 1 tablet once daily - Appropriate, Effective, Safe, Accessible ?-Medications previously tried: none  ?-Current dietary patterns: focusing on decreasing bread and soda ?-Current exercise habits: not consistently exercising ?-Educated on Cholesterol goals;  ?Exercise goal of 150 minutes per week; ?-Counseled on diet and exercise extensively ?Recommended to continue current medication ? ?Diabetes (A1c goal <7%) ?-Not ideally controlled ?-Current medications: ?glimepiride (Amaryl) '4mg'$ , 1 tablet once daily before breakfast - Appropriate, Query effective, Safe, Accessible ?metformin '1000mg'$ , 1 tablet twice daily - Appropriate, Query effective, Safe, Accessible ?Ozempic 0.5 mg inject once weekly - Appropriate, Query effective, Safe, Accessible ?-Medications previously tried: Onglyza (cost)   ?-Current home glucose readings: 7 days 138 (9 numbers), 14 days 151 (18 numbers), 151 (30 days) ?fasting glucose: 163, 140, 105, 169, 132, 154, 155, 147, 124 (mornings) ?post prandial glucose: sometimes before and sometimes ?Lowest: 80 (before bedtime)  ?-Denies hypoglycemic/hyperglycemic symptoms ?-Current meal patterns:  ?breakfast: eggs, grits & bacon and toast ?lunch: did not discuss  ?dinner: Maurice last night; cereal ?snacks: not many desserts, peanut butter crackers ?drinks: 1/2 can of soda and juice, not much water ?-Current exercise: not exercising consistently; was walking 3 times a week  for about 20 minutes but is having more knee pain/popping ?-Educated on A1c and blood sugar goals; ?Exercise goal of 150 minutes per week; ?Benefits of routine self-monitoring of blood sugar; ?Carbohydrate counting and/or plate method ?-Counseled  to check feet daily and get yearly eye exams ?-Counseled on diet and exercise extensively ?Collaborated with to increase Ozempic up to 1 mg as long as patient tolerates in order to back down on glimepiride. ? ?Vitamin B12 deficiency (Goal: vitamin B12 211-911) ?-Controlled ?-Current treatment  ?vitamin B12 559mg, 1 tablet once daily - Appropriate, Effective, Safe, Accessible ?-Medications previously tried: none  ?-Recommended to continue current medication ? ?Health Maintenance ?-Vaccine gaps: tetanus, COVID booster ?-Current therapy:  ?Cetirizine 10 mg 1 tablet daily ?-Educated on Cost vs benefit of each product must be carefully weighed by individual consumer ?-Patient is satisfied with current therapy and denies issues ?-Recommended to continue current medication ? ?Patient Goals/Self-Care Activities ?Patient will:  ?- take medications as prescribed ?check glucose daily, document, and provide at future appointments ?check blood pressure weekly, document, and provide at future appointments ? ?Follow Up Plan: The care management team will reach out to the patient again over the next 60 days.   ? ?  ?  ? ?Patient verbalizes understanding of instructions and care plan provided today and agrees to view in [facility] MyChart status confirmed with patient.   ?The pharmacy team will reach out to the patient again over the next 60 days.  ? ?MViona Gilmore RPH  ?

## 2021-07-11 ENCOUNTER — Other Ambulatory Visit: Payer: Self-pay | Admitting: Family Medicine

## 2021-07-12 DIAGNOSIS — E1159 Type 2 diabetes mellitus with other circulatory complications: Secondary | ICD-10-CM | POA: Diagnosis not present

## 2021-07-12 DIAGNOSIS — I152 Hypertension secondary to endocrine disorders: Secondary | ICD-10-CM | POA: Diagnosis not present

## 2021-07-12 DIAGNOSIS — E1140 Type 2 diabetes mellitus with diabetic neuropathy, unspecified: Secondary | ICD-10-CM

## 2021-07-15 ENCOUNTER — Other Ambulatory Visit: Payer: Self-pay | Admitting: Family Medicine

## 2021-07-16 ENCOUNTER — Telehealth: Payer: Self-pay | Admitting: Pharmacist

## 2021-07-16 NOTE — Chronic Care Management (AMB) (Signed)
? ? ?Chronic Care Management ?Pharmacy Assistant  ? ?Name: Andrea [name]  MRN: [id number] DOB: 1955/07/23 ? ?Reason for Encounter: Medication Review / Medication Coordination Call ?  ?Conditions to be addressed/monitored: ?HTN ? ?Recent office visits:  ?None ? ?Recent consult visits:  ?None ? ?Hospital visits:  ?None ? ?Medications: ?Outpatient Encounter Medications as of 07/16/2021  ?Medication Sig  ? aspirin 81 MG tablet Take 81 mg by mouth daily.    ? cetirizine (ZYRTEC) 10 MG tablet Take 10 mg by mouth daily.  ? folic acid (FOLVITE) 1 MG tablet Take 1 tablet (1 mg total) by mouth daily.  ? glimepiride (AMARYL) 4 MG tablet TAKE ONE TABLET BY MOUTH BEFORE BREAKFAST  ? glucose blood (ONETOUCH ULTRA) test strip USE TO CHECH BLOOD SUGAR DAILY AND AS NEEDED  ? metFORMIN (GLUCOPHAGE) 1000 MG tablet TAKE ONE TABLET BY MOUTH TWICE DAILY  ? metoprolol tartrate (LOPRESSOR) 25 MG tablet TAKE ONE TABLET BY MOUTH TWICE DAILY  ? ONETOUCH DELICA LANCETS FINE MISC 1 Stick by Does not apply route daily.  ? pantoprazole (PROTONIX) 40 MG tablet Take 1 tablet (40 mg total) by mouth daily.  ? rosuvastatin (CRESTOR) 10 MG tablet TAKE ONE TABLET BY MOUTH EVERY EVENING  ? Semaglutide,0.25 or 0.'5MG'$ /DOS, (OZEMPIC, 0.25 OR 0.5 MG/DOSE,) 2 MG/1.5ML SOPN Inject 0.5 mg into the skin once a week.  ? valsartan-hydrochlorothiazide (DIOVAN-HCT) 160-12.5 MG tablet TAKE ONE TABLET BY MOUTH ONCE DAILY  ? vitamin B-12 (CYANOCOBALAMIN) 500 MCG tablet Take 500 mcg by mouth daily.   ? ?No facility-administered encounter medications on file as of 07/16/2021.  ? Reviewed chart for medication changes ahead of medication coordination call. ? ?No OVs, Consults, or hospital visits since last care coordination call/Pharmacist visit. (If appropriate, list visit date, provider name) ? ?No medication changes indicated OR if recent visit, treatment plan here. ? ?BP Readings from Last 3 Encounters:  ?05/20/21 130/60  ?03/04/21 134/74  ?02/08/21 130/60  ?  ?Lab Results   ?Component Value Date  ? HGBA1C 7.0 (A) 05/20/2021  ?  ? ?Patient obtains medications through Adherence Packaging  30 Days  ?  ?Last adherence delivery included: ?Valsartan-hydrochlorothiazide 160/12.5 mg One tablet daily (breakfast) ?Rosuvastatin '10mg'$  - take 1 tablet daily with dinner ?Metformin 1000 mg- 1 tablet twice daily (Breakfast and evening meal) ?Glimepiride 4 mg One tablet daily (before breakfast) ?Metoprolol 25 mg One tablet twice daily (Breakfast and evening meal) ?Folic acid 1 mg - 1 tablet at breakfast ?  ?Patient declined (meds) last month: Patient did not decline medication last month. ?  ?Patient is due for next adherence delivery on: 07/26/2021 ?  ?Called patient and reviewed medications and coordinated delivery. ?  ?This delivery to include: ?Valsartan-hydrochlorothiazide 160/12.5 mg One tablet daily (breakfast) ?Rosuvastatin '10mg'$  - take 1 tablet daily with dinner ?Metformin 1000 mg- 1 tablet twice daily (Breakfast and evening meal) ?Glimepiride 4 mg One tablet daily (before breakfast) ?Metoprolol 25 mg One tablet twice daily (Breakfast and evening meal) ?Folic acid 1 mg - 1 tablet at breakfast ?  ?Patient will need a short fill: Patient declined any short fills. ?  ?Coordinated acute fill: Patient declined any acute fills. ?  ?Patient declined the following medications: Patient did not decline any medications. ?  ?Confirmed delivery date of 07/26/2021 advised patient that pharmacy will contact them the morning of delivery. ? ?Care Gaps: ?AWV - scheduled for 01/28/2022 ?Last BP - 130/60 on 05/20/2021 ?Last A1C - 7.0 on 05/20/2021 ?Covid - overdue ?  ?  Star Rating Drugs: ?Rosuvastatin 10 mg - last filled 06/21/2021 30DS at [facility] ?Valsartan HCTZ - last filled 06/21/2021 30DS at [facility] ?Metformin 1000 mg - last filled 06/21/2021 30DS at [facility] ?Glimepiride 4 mg - last filled 06/21/2021 30[facility] at [facility] ? ?Gennie Alma CMA  ?Clinical Pharmacist Assistant ?[phone] ? ?

## 2021-08-12 ENCOUNTER — Encounter: Payer: Self-pay | Admitting: Family Medicine

## 2021-08-12 ENCOUNTER — Ambulatory Visit (INDEPENDENT_AMBULATORY_CARE_PROVIDER_SITE_OTHER): Payer: Medicare Other | Admitting: Family Medicine

## 2021-08-12 VITALS — BP 142/60 | HR 70 | Temp 98.1°F | Ht 63.0 in | Wt 151.6 lb

## 2021-08-12 DIAGNOSIS — E785 Hyperlipidemia, unspecified: Secondary | ICD-10-CM

## 2021-08-12 DIAGNOSIS — E1169 Type 2 diabetes mellitus with other specified complication: Secondary | ICD-10-CM | POA: Diagnosis not present

## 2021-08-12 DIAGNOSIS — E1159 Type 2 diabetes mellitus with other circulatory complications: Secondary | ICD-10-CM | POA: Diagnosis not present

## 2021-08-12 DIAGNOSIS — I152 Hypertension secondary to endocrine disorders: Secondary | ICD-10-CM | POA: Diagnosis not present

## 2021-08-12 DIAGNOSIS — E1140 Type 2 diabetes mellitus with diabetic neuropathy, unspecified: Secondary | ICD-10-CM

## 2021-08-12 LAB — COMPREHENSIVE METABOLIC PANEL
ALT: 14 U/L (ref 0–35)
AST: 24 U/L (ref 0–37)
Albumin: 4.3 g/dL (ref 3.5–5.2)
Alkaline Phosphatase: 55 U/L (ref 39–117)
BUN: 15 mg/dL (ref 6–23)
CO2: 27 mEq/L (ref 19–32)
Calcium: 9.7 mg/dL (ref 8.4–10.5)
Chloride: 99 mEq/L (ref 96–112)
Creatinine, Ser: 0.96 mg/dL (ref 0.40–1.20)
GFR: 60.96 mL/min (ref >60.00)
Glucose, Bld: 127 mg/dL — ABNORMAL HIGH (ref 70–99)
Potassium: 4.6 mEq/L (ref 3.5–5.1)
Sodium: 136 mEq/L (ref 135–145)
Total Bilirubin: 0.4 mg/dL (ref 0.2–1.2)
Total Protein: 7.6 g/dL (ref 6.0–8.3)

## 2021-08-12 LAB — LIPID PANEL
Cholesterol: 121 mg/dL (ref 0–200)
HDL: 29.3 mg/dL — ABNORMAL LOW (ref >39.00)
NonHDL: 92.08
Total CHOL/HDL Ratio: 4
Triglycerides: 350 mg/dL — ABNORMAL HIGH (ref 0.0–149.0)
VLDL: 70 mg/dL — ABNORMAL HIGH (ref 0.0–40.0)

## 2021-08-12 LAB — LDL CHOLESTEROL, DIRECT: Direct LDL: 55 mg/dL

## 2021-08-12 LAB — HEMOGLOBIN A1C: Hgb A1c MFr Bld: 7.2 % — ABNORMAL HIGH (ref 4.6–6.5)

## 2021-08-12 NOTE — Progress Notes (Signed)
?Andrea [name] ?DOB: [birth year] ?Encounter date: 08/12/2021 ? ?This is a 66 y.o. female who presents with ?Chief Complaint  ?Patient presents with  ? Follow-up  ? ? ?History of present illness: ?HTN: has been checking at home - 120/60, 126/58, 127/64. Didn't take mediation this morning.  ? ?DMII: doing well with the ozempic now. Had upset stomach when she started this. Was sick from December into January. After she felt better and wasable to increase to the0.'5mg'$  and tolerating this well. Has had a few low sugars - can start to feel it when she gets into the 80's. Lows tend to come in evening. Doesn't eat much for dinner. Biggest meal of the day is breakfast. Doesn't usually eat lunch. She is walking in afternoon. Goes about 15 minutes. Knee bothers her and she has to stop and rest it when she is walking.  ? ?Right knee: had surgery on this when younger. Bothers her if up and on knee too much.  ? ?She did see eye doc. Got good report. Saw Dr. Caro Hight. ? ?Allergies  ?Allergen Reactions  ? Plavix [Clopidogrel Bisulfate]   ?  Caused platelets to drop  ? ?Current Meds  ?Medication Sig  ? aspirin 81 MG tablet Take 81 mg by mouth daily.    ? cetirizine (ZYRTEC) 10 MG tablet Take 10 mg by mouth daily.  ? folic acid (FOLVITE) 1 MG tablet Take 1 tablet (1 mg total) by mouth daily.  ? glimepiride (AMARYL) 4 MG tablet TAKE ONE TABLET BY MOUTH BEFORE BREAKFAST  ? glucose blood (ONETOUCH ULTRA) test strip USE TO CHECH BLOOD SUGAR DAILY AND AS NEEDED  ? metFORMIN (GLUCOPHAGE) 1000 MG tablet TAKE ONE TABLET BY MOUTH TWICE DAILY  ? metoprolol tartrate (LOPRESSOR) 25 MG tablet TAKE ONE TABLET BY MOUTH TWICE DAILY  ? ONETOUCH DELICA LANCETS FINE MISC 1 Stick by Does not apply route daily.  ? pantoprazole (PROTONIX) 40 MG tablet Take 1 tablet (40 mg total) by mouth daily.  ? rosuvastatin (CRESTOR) 10 MG tablet TAKE ONE TABLET BY MOUTH EVERY EVENING  ? Semaglutide,0.25 or 0.'5MG'$ /DOS, (OZEMPIC, 0.25 OR 0.5 MG/DOSE,) 2 MG/1.5ML SOPN Inject  0.5 mg into the skin once a week.  ? valsartan-hydrochlorothiazide (DIOVAN-HCT) 160-12.5 MG tablet TAKE ONE TABLET BY MOUTH ONCE DAILY  ? vitamin B-12 (CYANOCOBALAMIN) 500 MCG tablet Take 500 mcg by mouth daily.   ? ? ?Review of Systems  ?Constitutional:  Negative for chills, fatigue and fever.  ?Respiratory:  Negative for cough, chest tightness, shortness of breath and wheezing.   ?Cardiovascular:  Negative for chest pain, palpitations and leg swelling.  ? ?Objective: ? ?BP (!) 142/60 (BP Location: Right Arm, Cuff Size: Normal)   Pulse 70   Temp 98.1 ?F (36.7 ?C) (Oral)   Ht '5\' 3"'$  (1.6 m)   Wt 151 lb 9.6 oz (68.8 kg)   SpO2 98%   BMI 26.85 kg/m?   Weight: 151 lb 9.6 oz (68.8 kg)  ? ?BP Readings from Last 3 Encounters:  ?08/12/21 (!) 142/60  ?05/20/21 130/60  ?03/04/21 134/74  ? ?Wt Readings from Last 3 Encounters:  ?08/12/21 151 lb 9.6 oz (68.8 kg)  ?05/20/21 151 lb (68.5 kg)  ?02/08/21 154 lb 3.2 oz (69.9 kg)  ? ? ?Physical Exam ?Constitutional:   ?   General: She is not in acute distress. ?   Appearance: She is well-developed.  ?Cardiovascular:  ?   Rate and Rhythm: Normal rate and regular rhythm.  ?   Heart sounds:  Normal heart sounds. No murmur heard. ?  No friction rub.  ?Pulmonary:  ?   Effort: Pulmonary effort is normal. No respiratory distress.  ?   Breath sounds: Normal breath sounds. No wheezing or rales.  ?Musculoskeletal:  ?   Right lower leg: No edema.  ?   Left lower leg: No edema.  ?Neurological:  ?   Mental Status: She is alert and oriented to person, place, and time.  ?Psychiatric:     ?   Behavior: Behavior normal.  ? ? ?Assessment/Plan ? ?1. Type 2 diabetes mellitus with diabetic neuropathy, without long-term current use of insulin ([place]) ?If a1c stable to improved we will decrease or stop the glimepiride. We will then increase the ozempic. We will slowly increase dose to avoid side effects.  ?- Hemoglobin A1c; Future ? ?2. Hyperlipidemia associated with type 2 diabetes mellitus  ([place]) ?Crestor '10mg'$  daily.  ?- Lipid panel; Future ? ?3. Hypertension associated with diabetes ([place]) ?Lopressor '25mg'$  BID, diovan hctz. Bp elevated in office today. Her home readings are much lower. We will have her compare home cuff to pharmacy (or she might consider buying new cuff and will let me know) ?- Comprehensive metabolic panel; Future ? ?Return for pending lab or imaging results. ? ? ? ? ? ?[name], MD ?

## 2021-08-14 ENCOUNTER — Other Ambulatory Visit: Payer: Self-pay | Admitting: Family Medicine

## 2021-08-15 ENCOUNTER — Telehealth: Payer: Self-pay | Admitting: Pharmacist

## 2021-08-15 NOTE — Chronic Care Management (AMB) (Signed)
? ? ?Chronic Care Management ?Pharmacy Assistant  ? ?Name: Andrea [name]  MRN: [id number] DOB: 1955-08-23 ? ?Reason for Encounter: Medication Review / Medication Coordination Call ?  ?Conditions to be addressed/monitored: ?HTN ? ?Recent office visits:  ?08/12/2021 [name] MD - Patient was seen for Type 2 diabetes mellitus with diabetic neuropathy, without long-term current use of insulin and additional issues. No medication changes. No follow up noted.  ? ?Recent consult visits:  ?None ? ?Hospital visits:  ?None ? ?Medications: ?Outpatient Encounter Medications as of 08/15/2021  ?Medication Sig  ? aspirin 81 MG tablet Take 81 mg by mouth daily.    ? cetirizine (ZYRTEC) 10 MG tablet Take 10 mg by mouth daily.  ? folic acid (FOLVITE) 1 MG tablet TAKE ONE TABLET BY MOUTH EVERY MORNING  ? glimepiride (AMARYL) 4 MG tablet TAKE ONE TABLET BY MOUTH BEFORE BREAKFAST  ? glucose blood (ONETOUCH ULTRA) test strip USE TO [place] BLOOD SUGAR DAILY AND AS NEEDED  ? metFORMIN (GLUCOPHAGE) 1000 MG tablet TAKE ONE TABLET BY MOUTH TWICE DAILY  ? metoprolol tartrate (LOPRESSOR) 25 MG tablet TAKE ONE TABLET BY MOUTH TWICE DAILY  ? ONETOUCH DELICA LANCETS FINE MISC 1 Stick by Does not apply route daily.  ? pantoprazole (PROTONIX) 40 MG tablet Take 1 tablet (40 mg total) by mouth daily.  ? rosuvastatin (CRESTOR) 10 MG tablet TAKE ONE TABLET BY MOUTH EVERY EVENING  ? Semaglutide,0.25 or 0.'5MG'$ /DOS, (OZEMPIC, 0.25 OR 0.5 MG/DOSE,) 2 MG/1.5ML SOPN Inject 0.5 mg into the skin once a week.  ? valsartan-hydrochlorothiazide (DIOVAN-HCT) 160-12.5 MG tablet TAKE ONE TABLET BY MOUTH ONCE DAILY  ? vitamin B-12 (CYANOCOBALAMIN) 500 MCG tablet Take 500 mcg by mouth daily.   ? ?No facility-administered encounter medications on file as of 08/15/2021.  ? ?Reviewed chart for medication changes ahead of medication coordination call. ? ?No OVs, Consults, or hospital visits since last care coordination call/Pharmacist visit. (If appropriate, list visit  date, provider name) ? ?No medication changes indicated OR if recent visit, treatment plan here. ? ?BP Readings from Last 3 Encounters:  ?08/12/21 (!) 142/60  ?05/20/21 130/60  ?03/04/21 134/74  ?  ?Lab Results  ?Component Value Date  ? HGBA1C 7.2 (H) 08/12/2021  ?  ? ?Patient obtains medications through Adherence Packaging  30 Days  ?  ?Last adherence delivery included: ?Valsartan-hydrochlorothiazide 160/12.5 mg One tablet daily (breakfast) ?Rosuvastatin '10mg'$  - take 1 tablet daily with dinner ?Metformin 1000 mg- 1 tablet twice daily (Breakfast and evening meal) ?Glimepiride 4 mg One tablet daily (before breakfast) ?Metoprolol 25 mg One tablet twice daily (Breakfast and evening meal) ?Folic acid 1 mg - 1 tablet at breakfast ?  ?Patient declined (meds) last month: Patient did not decline medication last month. ?  ?Patient is due for next adherence delivery on: 08/27/2021 ?  ?Called patient and reviewed medications and coordinated delivery. ?  ?This delivery to include: ?Valsartan-hydrochlorothiazide 160/12.5 mg One tablet daily (breakfast) ?Rosuvastatin '10mg'$  - take 1 tablet daily with dinner ?Metformin 1000 mg- 1 tablet twice daily (Breakfast and evening meal) ?Glimepiride 4 mg One tablet daily (before breakfast) ?Metoprolol 25 mg One tablet twice daily (Breakfast and evening meal) ?Folic acid 1 mg - 1 tablet at breakfast ?  ?Patient will need a short fill: Patient declined any short fills. ?  ?Coordinated acute fill: Patient declined any acute fills. ?  ?Patient declined the following medications: Patient did not decline any medications. ?  ?Confirmed delivery date of 08/27/2021 advised patient that pharmacy will  contact them the morning of delivery. ?  ?Care Gaps: ?AWV - scheduled for 01/28/2022 ?Last BP - 142/60 on 08/12/2021 ?Last A1C - 7.2 on 08/12/2021 ?  ?Star Rating Drugs: ?Rosuvastatin 10 mg - last filled 07/19/2021 [facility] at [facility] ?Valsartan HCTZ - last filled 07/19/2021 30DS at [facility] ?Metformin 1000 mg  - last filled 07/19/2021 30DS at [facility] ?Glimepiride 4 mg - last filled 07/19/2021 30[facility] at [facility] ? ?Gennie Alma CMA  ?Clinical Pharmacist Assistant ?[phone] ? ?

## 2021-08-20 ENCOUNTER — Other Ambulatory Visit: Payer: Self-pay | Admitting: Family Medicine

## 2021-08-20 ENCOUNTER — Telehealth: Payer: Self-pay | Admitting: *Deleted

## 2021-08-20 MED ORDER — PANTOPRAZOLE SODIUM 40 MG PO TBEC: 40.0000 mg | DELAYED_RELEASE_TABLET | Freq: Every day | ORAL | 1 refills | Status: DC

## 2021-08-20 NOTE — Telephone Encounter (Signed)
-----   Message from [name], [facility] sent at 08/20/2021  9:49 AM EDT ----- ?Regarding: Pantoprazole refill ?Hi, ? ?Can you please send a refill of pantoprazole to [facility]? ? ?Thanks! ?Maddie ? ?

## 2021-08-20 NOTE — Telephone Encounter (Signed)
Rx done. 

## 2021-08-21 ENCOUNTER — Other Ambulatory Visit: Payer: Self-pay | Admitting: Family Medicine

## 2021-08-21 MED ORDER — ROSUVASTATIN CALCIUM 20 MG PO TABS: 20.0000 mg | ORAL_TABLET | Freq: Every evening | ORAL | 1 refills | Status: DC

## 2021-08-21 MED ORDER — SEMAGLUTIDE (1 MG/DOSE) 4 MG/3ML ~~LOC~~ SOPN: 1.0000 mg | PEN_INJECTOR | SUBCUTANEOUS | 2 refills | Status: DC

## 2021-08-21 MED ORDER — GLIMEPIRIDE 2 MG PO TABS: ORAL_TABLET | ORAL | 1 refills | Status: DC

## 2021-08-23 ENCOUNTER — Telehealth: Payer: Self-pay | Admitting: Pharmacist

## 2021-08-23 NOTE — Chronic Care Management (AMB) (Signed)
    Chronic Care Management Pharmacy Assistant   Name: Andrea [name]  MRN: [id number] DOB: Jul 11, 1955  Reason for Encounter: Medication update and PAP application.   Patient was notified she is in the donut hole and her Ozempic will currently cost around $205. She was also informed we will be completing the application for patient assistance.   PAP form for Ozempic completed, to be completed at visit with [facility] Pharmacist Assistant [phone]

## 2021-09-04 ENCOUNTER — Ambulatory Visit: Payer: Medicare Other | Admitting: Podiatry

## 2021-09-04 ENCOUNTER — Encounter: Payer: Self-pay | Admitting: Podiatry

## 2021-09-04 DIAGNOSIS — M79674 Pain in right toe(s): Secondary | ICD-10-CM

## 2021-09-04 DIAGNOSIS — M79675 Pain in left toe(s): Secondary | ICD-10-CM

## 2021-09-04 DIAGNOSIS — B351 Tinea unguium: Secondary | ICD-10-CM

## 2021-09-04 DIAGNOSIS — E0843 Diabetes mellitus due to underlying condition with diabetic autonomic (poly)neuropathy: Secondary | ICD-10-CM | POA: Diagnosis not present

## 2021-09-04 NOTE — Progress Notes (Signed)
This patient returns to my office for at risk foot care.  This patient requires this care by a professional since this patient will be at risk due to having diabetes.  This patient is unable to cut nails herself since the patient cannot reach her nails.These nails are painful walking and wearing shoes.  This patient presents for at risk foot care today.  General Appearance  Alert, conversant and in no acute stress.  Vascular  Dorsalis pedis and posterior tibial  pulses are palpable  bilaterally.  Capillary return is within normal limits  bilaterally. Temperature is within normal limits  bilaterally.  Neurologic  Senn-Weinstein monofilament wire test within normal limits  bilaterally. Muscle power within normal limits bilaterally.  Nails Thick disfigured discolored nails with subungual debris  from hallux to fifth toes bilaterally. No evidence of bacterial infection or drainage bilaterally.Pincer hallux right toenail.  Orthopedic  No limitations of motion  feet .  No crepitus or effusions noted.  No bony pathology or digital deformities noted.  Skin  normotropic skin with no porokeratosis noted bilaterally.  No signs of infections or ulcers noted.     Onychomycosis  Pain in right toes  Pain in left toes  Consent was obtained for treatment procedures.   Mechanical debridement of nails 1-5  bilaterally performed with a nail nipper.  Filed with dremel without incident.    Return office visit    4  months                 Told patient to return for periodic foot care and evaluation due to potential at risk complications.   Jyra Lagares DPM   

## 2021-09-05 ENCOUNTER — Telehealth: Payer: Self-pay | Admitting: Pharmacist

## 2021-09-05 NOTE — Progress Notes (Signed)
Chronic Care Management Pharmacy Note  09/06/2021 Name:  Andrea [name] MRN:  [id number] DOB:  07-30-1955  Summary: A1c not quite at goal < 7%  BP not at goal < 130/80   Recommendations/Changes made from today's visit: -Recommend increasing to 47 clicks on the Ozempic pen (from 43 clicks) -Recommended increasing HCTZ to 25 mg daily -Recommended checking BP at home a few days a week   Plan: Follow up BP assessment in 2 weeks   Subjective: Andrea [name] is an 66 y.o. year old female who is a primary patient of Koberlein, Steele Berg, MD.  The CCM team was consulted for assistance with disease management and care coordination needs.    Engaged with patient face to face for follow up visit in response to provider referral for pharmacy case management and/or care coordination services.   Consent to Services:  The patient was given information about Chronic Care Management services, agreed to services, and gave verbal consent prior to initiation of services.  Please see initial visit note for detailed documentation.   Patient Care Team: Caren Macadam, MD as PCP - General (Family Medicine) Rutherford Guys, MD as Consulting Physician (Ophthalmology) [name], [facility] as Pharmacist (Pharmacist)  Recent office visits: 08/12/2021 [name] MD - Patient was seen for Type 2 diabetes mellitus with diabetic neuropathy, without long-term current use of insulin and additional issues. Rosuvastatin increased to 20 mg for triglyceride lowering.  05/20/21 [name], MD: Patient presented for chronic conditions follow up and nausea. Decreased back to 0.25 mg on Ozempic due to nausea. Referred for annual low dose CT scan. Recommended melatonin for sleep. Decreased vitamin B12 supplement.  Recent consult visits: 09/04/21 Gardiner Barefoot, DPM ([facility]): Patient presented for nail trim.  06/20/21 Rutherford Guys, MD ([facility]): Patient presented for diabetic eye exam.    06/05/21 Gardiner Barefoot, DPM ([facility]): Patient presented for nail trim.  Hospital visits: None in previous 6 months  Objective:  Lab Results  Component Value Date   CREATININE 0.96 08/12/2021   BUN 15 08/12/2021   GFR 60.96 08/12/2021   GFRNONAA >60 05/12/2018   GFRAA >60 05/12/2018   NA 136 08/12/2021   K 4.6 08/12/2021   CALCIUM 9.7 08/12/2021   CO2 27 08/12/2021   GLUCOSE 127 (H) 08/12/2021    Lab Results  Component Value Date/Time   HGBA1C 7.2 (H) 08/12/2021 10:26 AM   HGBA1C 7.0 (A) 05/20/2021 09:02 AM   HGBA1C 7.0 (H) 02/08/2021 11:38 AM   GFR 60.96 08/12/2021 10:26 AM   GFR 65.11 05/20/2021 09:48 AM   MICROALBUR 2.2 (H) 02/08/2021 11:38 AM   MICROALBUR 1.5 12/21/2019 08:54 AM    Last diabetic Eye exam:  Lab Results  Component Value Date/Time   HMDIABEYEEXA No Retinopathy 06/20/2021 12:00 AM    Last diabetic Foot exam:  Lab Results  Component Value Date/Time   HMDIABFOOTEX done 07/01/2009 12:00 AM     Lab Results  Component Value Date   CHOL 121 08/12/2021   HDL 29.30 (L) 08/12/2021   LDLCALC 46 12/21/2019   LDLDIRECT 55.0 08/12/2021   TRIG 350.0 (H) 08/12/2021   CHOLHDL 4 08/12/2021       Latest Ref Rng & Units 08/12/2021   10:26 AM 05/20/2021    9:48 AM 02/08/2021   11:38 AM  Hepatic Function  Total Protein 6.0 - 8.3 g/dL 7.6   7.2   7.4    Albumin 3.5 - 5.2  g/dL 4.3   4.1   4.3    AST 0 - 37 U/L _0 ALT 0 - 35 U/L _1 Alk Phosphatase 39 - 117 U/L 55   52   47    Total Bilirubin 0.2 - 1.2 mg/dL 0.4   0.3   0.4      Lab Results  Component Value Date/Time   TSH 3.35 02/08/2021 11:38 AM   TSH 3.29 12/21/2019 08:54 AM       Latest Ref Rng & Units 05/20/2021    9:48 AM 02/08/2021   11:38 AM 12/21/2019    8:54 AM  CBC  WBC 4.0 - 10.5 K/uL 9.7   6.2   7.4    Hemoglobin 12.0 - 15.0 g/dL 12.2   12.6   12.6    Hematocrit 36.0 - 46.0 % 35.6   37.0   37.2    Platelets 150.0 - 400.0 K/uL 239.0   265.0   259       Lab Results  Component Value Date/Time   VD25OH 35 12/21/2019 08:54 AM    Clinical ASCVD: Yes  The ASCVD Risk score (Arnett DK, et al., 2019) failed to calculate for the following reasons:   The valid total cholesterol range is 130 to 320 mg/dL       08/12/2021    9:24 AM 05/20/2021    9:50 AM 01/15/2021    9:41 AM  Depression screen PHQ 2/9  Decreased Interest 0 2 0  Down, Depressed, Hopeless 0 0 0  PHQ - 2 Score 0 2 0  Altered sleeping 0 2   Tired, decreased energy 0 2   Change in appetite 0 2   Feeling bad or failure about yourself  0 2   Trouble concentrating 0 0   Moving slowly or fidgety/restless 0 0   Suicidal thoughts 0 0   PHQ-9 Score 0 10      Social History   Tobacco Use  Smoking Status Former   Types: E-cigarettes   Quit date: 06/20/2010   Years since quitting: 11.2  Smokeless Tobacco Never  Tobacco Comments   smoked 1ppd for> 30 years/smokes occasionally,some vapor cigarettes   BP Readings from Last 3 Encounters:  08/12/21 (!) 142/60  05/20/21 130/60  03/04/21 134/74   Pulse Readings from Last 3 Encounters:  08/12/21 70  05/20/21 77  03/04/21 71   Wt Readings from Last 3 Encounters:  08/12/21 151 lb 9.6 oz (68.8 kg)  05/20/21 151 lb (68.5 kg)  02/08/21 154 lb 3.2 oz (69.9 kg)   BMI Readings from Last 3 Encounters:  08/12/21 26.85 kg/m  05/20/21 26.75 kg/m  02/08/21 27.32 kg/m    Assessment/Interventions: Review of patient past medical history, allergies, medications, health status, including review of consultants reports, laboratory and other test data, was performed as part of comprehensive evaluation and provision of chronic care management services.   SDOH:  (Social Determinants of Health) assessments and interventions performed: No  SDOH Screenings   Alcohol Screen: Not on file  Depression (PHQ2-9): Low Risk    PHQ-2 Score: 0  Financial Resource Strain: Low Risk    Difficulty of Paying Living Expenses: Not hard at all  Food  Insecurity: No Food Insecurity   Worried About Charity fundraiser in the Last Year: Never true   Ran Out of Food in the Last Year: Never true  Housing:  Low Risk    Last Housing Risk Score: 0  Physical Activity: Insufficiently Active   Days of Exercise per Week: 3 days   Minutes of Exercise per Session: 20 min  Social Connections: Socially Isolated   Frequency of Communication with Friends and Family: Three times a week   Frequency of Social Gatherings with Friends and Family: More than three times a week   Attends Religious Services: Never   Marine scientist or Organizations: No   Attends Music therapist: Never   Marital Status: Divorced  Stress: No Stress Concern Present   Feeling of Stress : Not at all  Tobacco Use: Medium Risk   Smoking Tobacco Use: Former   Smokeless Tobacco Use: Never   Passive Exposure: Not on file  Transportation Needs: No Transportation Needs   Lack of Transportation (Medical): No   Lack of Transportation (Non-Medical): No    CCM Care Plan  Allergies  Allergen Reactions   Plavix [Clopidogrel Bisulfate]     Caused platelets to drop    Medications Reviewed Today     Reviewed by Gardiner Barefoot, DPM (Physician) on 09/04/21 at [facility] List Status: <None>   Medication Order Taking? Sig Documenting Provider Last Dose Status Informant  aspirin 81 MG tablet [id number] No Take 81 mg by mouth daily.   [provider] Taking Active Self  cetirizine (ZYRTEC) 10 MG tablet [id number] No Take 10 mg by mouth daily. [provider] Taking Active Self  folic acid (FOLVITE) 1 MG tablet [id number]  TAKE ONE TABLET BY MOUTH EVERY MORNING Koberlein, Junell C, MD  Active   glimepiride (AMARYL) 2 MG tablet [id number]  TAKE ONE TABLET BY MOUTH BEFORE BREAKFAST Koberlein, Junell C, MD  Active   glucose blood (ONETOUCH ULTRA) test strip [id number] No USE TO [facility] BLOOD SUGAR DAILY AND AS NEEDED Koberlein, Junell C, MD Taking Active    metFORMIN (GLUCOPHAGE) 1000 MG tablet [id number] No TAKE ONE TABLET BY MOUTH TWICE DAILY Koberlein, Junell C, MD Taking Active   metoprolol tartrate (LOPRESSOR) 25 MG tablet [id number] No TAKE ONE TABLET BY MOUTH TWICE DAILY Caren Macadam, MD Taking Active   [facility] LANCETS FINE MISC [id number] No 1 Stick by Does not apply route daily. Ricard Dillon, MD Taking Active Self  pantoprazole (PROTONIX) 40 MG tablet [id number]  Take 1 tablet (40 mg total) by mouth daily. Caren Macadam, MD  Active   rosuvastatin (CRESTOR) 20 MG tablet [id number]  Take 1 tablet (20 mg total) by mouth every evening. Caren Macadam, MD  Active   Semaglutide, 1 MG/DOSE, 4 MG/3ML SOPN [id number]  Inject 1 mg as directed once a week. Caren Macadam, MD  Active   valsartan-hydrochlorothiazide (DIOVAN-HCT) 160-12.5 MG tablet [id number] No TAKE ONE TABLET BY MOUTH ONCE DAILY Koberlein, Junell C, MD Taking Active   vitamin B-12 (CYANOCOBALAMIN) 500 MCG tablet [id number] No Take 500 mcg by mouth daily.  [provider] Taking Active Self            Patient Active Problem List   Diagnosis Date Noted   Hyperlipidemia associated with type 2 diabetes mellitus ([place]) 09/09/2018   B12 deficiency 09/09/2018   Pseudophakia 05/15/2016   Type 2 diabetes mellitus with diabetic neuropathy, without long-term current use of insulin ([place]) 05/24/2015   Hx of adenomatous colonic polyps 08/09/2014   Osteoarthritis 05/07/2007   Hypertension associated with diabetes ([place]) 11/19/2006   Coronary atherosclerosis 11/09/2006    Immunization History  Administered Date(s) Administered   Fluad Quad(high Dose 65+) 12/21/2019, 02/06/2021   Influenza Whole 01/12/1997, 01/05/2009   Influenza,inj,Quad PF,6+ Mos 01/12/2015, 12/27/2015, 03/09/2017, 12/01/2018   Influenza-Unspecified 02/12/2014, 01/19/2018   PFIZER(Purple Top)SARS-COV-2 Vaccination 07/07/2019, 08/01/2019, 02/06/2021   Pneumococcal Conjugate-13  12/01/2018   Pneumococcal Polysaccharide-23 04/24/2008, 01/31/2014, 12/21/2019   Td 11/13/1997, 04/27/2009   Tdap 05/20/2020   Zoster Recombinat (Shingrix) 08/22/2017, 11/12/2017, 01/12/2018    Conditions to be addressed/monitored:  Hypertension, Hyperlipidemia, Diabetes and vitamin B12 deficiency  Conditions addressed this visit: Diabetes, hypertension  Care Plan : [place]  Updates made by [name], [place] since 09/06/2021 12:00 AM     Problem: Problem: Hypertension, Hyperlipidemia, Diabetes and vitamin B12 deficiency      Long-Range Goal: Patient-Specific Goal   Start Date: 07/17/2020  Expected End Date: 07/17/2021  Recent Progress: On track  Priority: High  Note:   Current Barriers:  Unable to independently monitor therapeutic efficacy Unable to maintain control of blood pressure and diabetes  Pharmacist Clinical Goal(s):  Patient will achieve adherence to monitoring guidelines and medication adherence to achieve therapeutic efficacy through collaboration with PharmD and provider.   Interventions: 1:1 collaboration with Caren Macadam, MD regarding development and update of comprehensive plan of care as evidenced by provider attestation and co-signature Inter-disciplinary care team collaboration (see longitudinal plan of care) Comprehensive medication review performed; medication list updated in electronic medical record  Hypertension (BP goal <130/80) -Uncontrolled -Current treatment: metoprolol tartrate (Lopressor) 37m, 0.5 (one-half) tablet twice daily - Appropriate, Query effective, Safe, Accessible valsartan/HCTZ 160/12.558m 1 tablet once daily - Appropriate, Query effective, Safe, Accessible -Medications previously tried: Be[employer]ist, water qualityiHuntsman Corporationormulary) -Current home readings: 138/66, 146/78, 135/61, 135/60, 155/66, 135/70, 139/68 -Current dietary habits: only using salt when eating tomato sandwiches; does not use on any thing else -Current  exercise habits: not walking consistently - was walking 15-20 minutes per day -Denies hypotensive/hypertensive symptoms -Educated on Exercise goal of 150 minutes per week; Importance of home blood pressure monitoring; -Counseled to monitor BP at home weekly, document, and provide log at future appointments -Counseled on diet and exercise extensively Recommended to continue current medication Recommended increasing to HCTZ 25 mg daily.  Hyperlipidemia/coronary atherosclerosis: (LDL goal < 70) -Controlled -Current treatment: rosuvastatin (Crestor) 1047m1 tablet once daily - Appropriate, Effective, Safe, Accessible ASA 32m41m tablet once daily - Appropriate, Effective, Safe, Accessible -Medications previously tried: none  -Current dietary patterns: focusing on decreasing bread and soda -Current exercise habits: not consistently exercising -Educated on Cholesterol goals;  Exercise goal of 150 minutes per week; -Counseled on diet and exercise extensively Recommended to continue current medication  Diabetes (A1c goal <7%) -Not ideally controlled -Current medications: glimepiride (Amaryl) 4mg,107mtablet once daily before breakfast - Appropriate, Query effective, Safe, Accessible metformin 1000mg,74mablet twice daily - Appropriate, Query effective, Safe, Accessible Ozempic 0.5 mg inject once weekly - Appropriate, Query effective, Safe, Accessible -Medications previously tried: Onglyza (cost)   -Current home glucose readings: 7 days 138 (9 numbers), 14 days 151 (18 numbers), 151 (30 days) fasting glucose: 142, 140, 144, 141, 166, 92, 138, 151, 85, 109, 118, 190, 80, 143, 128, 185, 201, 153  (mornings) post prandial glucose: sometimes before and sometimes Lowest: 80 (before bedtime)  -Denies hypoglycemic/hyperglycemic symptoms -Current meal patterns:  breakfast: eggs, grits & bacon and toast lunch: did not discuss  dinner: [place]; cereal snacks: not many desserts,  peanut butter crackers drinks: 1/2 can of soda and juice, not  much water -Current exercise: not exercising consistently; was walking 3 times a week for about 20 minutes but is having more knee pain/popping -Educated on A1c and blood sugar goals; Exercise goal of 150 minutes per week; Benefits of routine self-monitoring of blood sugar; Carbohydrate counting and/or plate method -Counseled to check feet daily and get yearly eye exams -Counseled on diet and exercise extensively Collaborated with to increase Ozempic up to 1 mg as long as patient tolerates in order to back down on glimepiride.  Vitamin B12 deficiency (Goal: vitamin B12 211-911) -Controlled -Current treatment  vitamin B12 579mg, 1 tablet once daily - Appropriate, Effective, Safe, Accessible -Medications previously tried: none  -Recommended to continue current medication  Health Maintenance -Vaccine gaps: tetanus, COVID booster -Current therapy:  Cetirizine 10 mg 1 tablet daily -Educated on Cost vs benefit of each product must be carefully weighed by individual consumer -Patient is satisfied with current therapy and denies issues -Recommended to continue current medication  Patient Goals/Self-Care Activities Patient will:  - take medications as prescribed check glucose daily, document, and provide at future appointments check blood pressure weekly, document, and provide at future appointments  Follow Up Plan: The care management team will reach out to the patient again over the next 7 days.        Medication Assistance: None required.  Patient affirms current coverage meets needs.  Compliance/Adherence/Medication fill history: Care Gaps: COVID booster Last BP - 142/60 on 08/12/2021 Last A1C - 7.2 on 08/12/2021   Star-Rating Drugs: Glimepiride 2 mg - last filled 08/22/2021 30 DS at [facility] Metformin 1000 mg - last filled 08/20/2021 30 DS at [facility] Rosuvastatin 20 mg - last filled 08/22/2021 30 DS at  [facility] Valsartan HCTZ 160/12.5 mg - last filled 08/20/2021 30 DS at [facility] Semaglutide 165mdose - last filled 08/22/2021 30 DS at [facility]  Patient's preferred pharmacy is:  [facility] [place] [address] [place]one: [phone]: [phone]one: [phone]: [phone] Uses pill box? Yes - restocks on Wednesdays Pt endorses 100% compliance (sometime she might forget)  We discussed: Benefits of medication synchronization, packaging and delivery as well as enhanced pharmacist oversight with [facility]. Patient decided to: Utilize [facility] pharmacy for medication synchronization, packaging and delivery  Care Plan and Follow Up Patient Decision:  Patient agrees to Care Plan and Follow-up.  Plan: The care management team will reach out to the patient again over the next 7 days.  MaJeni SallesPharmD BCBeloit Health Systemlinical Pharmacist [place]

## 2021-09-05 NOTE — Chronic Care Management (AMB) (Signed)
    Chronic Care Management Pharmacy Assistant   Name: Andrea [name]  MRN: [id number] DOB: 1955/10/19  09/06/2021 APPOINTMENT REMINDER  Andrea [name] was reminded to have all medications, supplements and any blood glucose and blood pressure readings available for review with Andrea [name], Pharm. D, at her office visit on 09/06/2021 at 2:00.   Care Gaps: AWV - scheduled for 01/28/2022 Last BP - 142/60 on 08/12/2021 Last A1C - 7.2 on 08/12/2021 Covid booster - overdue  Star Rating Drug: Glimepiride 2 mg - last filled 08/22/2021 30 DS at [facility] Metformin 1000 mg - last filled 08/20/2021 30 DS at [facility] Rosuvastatin 20 mg - last filled 08/22/2021 30 DS at [facility] Valsartan HCTZ 160/12.5 mg - last filled 08/20/2021 30 DS at [facility] Semaglutide '1mg'$ /dose - last filled 08/22/2021 30 DS at [facility]  Any gaps in medications fill history? No  Andrea [name]  Catering manager [phone]

## 2021-09-06 ENCOUNTER — Ambulatory Visit (INDEPENDENT_AMBULATORY_CARE_PROVIDER_SITE_OTHER): Payer: Medicare Other | Admitting: Pharmacist

## 2021-09-06 ENCOUNTER — Other Ambulatory Visit: Payer: Self-pay | Admitting: Family Medicine

## 2021-09-06 DIAGNOSIS — I152 Hypertension secondary to endocrine disorders: Secondary | ICD-10-CM

## 2021-09-06 DIAGNOSIS — E1140 Type 2 diabetes mellitus with diabetic neuropathy, unspecified: Secondary | ICD-10-CM

## 2021-09-06 MED ORDER — OZEMPIC (2 MG/DOSE) 8 MG/3ML ~~LOC~~ SOPN: 2.0000 mg | PEN_INJECTOR | SUBCUTANEOUS | 5 refills | Status: DC

## 2021-09-06 MED ORDER — HYDROCHLOROTHIAZIDE 12.5 MG PO TABS: 12.5000 mg | ORAL_TABLET | Freq: Every day | ORAL | 0 refills | Status: DC

## 2021-09-06 MED ORDER — VALSARTAN-HYDROCHLOROTHIAZIDE 160-25 MG PO TABS: 1.0000 | ORAL_TABLET | Freq: Every day | ORAL | 1 refills | Status: DC

## 2021-09-06 NOTE — Patient Instructions (Addendum)
Go up to 47 clicks on the Ozempic pen for you next dose  Maddie [name], PharmD, [place] at [place]

## 2021-09-11 DIAGNOSIS — E1159 Type 2 diabetes mellitus with other circulatory complications: Secondary | ICD-10-CM

## 2021-09-11 DIAGNOSIS — E785 Hyperlipidemia, unspecified: Secondary | ICD-10-CM

## 2021-09-11 DIAGNOSIS — I2510 Atherosclerotic heart disease of native coronary artery without angina pectoris: Secondary | ICD-10-CM

## 2021-09-11 DIAGNOSIS — I10 Essential (primary) hypertension: Secondary | ICD-10-CM

## 2021-09-11 DIAGNOSIS — Z7984 Long term (current) use of oral hypoglycemic drugs: Secondary | ICD-10-CM

## 2021-09-16 ENCOUNTER — Telehealth: Payer: Self-pay | Admitting: Pharmacist

## 2021-09-16 NOTE — Chronic Care Management (AMB) (Unsigned)
Chronic Care Management Pharmacy Assistant   Name: Andrea [name]  MRN: [id number] DOB: [birth year]  Reason for Encounter: Medication Review / Medication Coordination Call  Recent office visits:  None  Recent consult visits:  None  Hospital visits:  None  Medications: Outpatient Encounter Medications as of 09/16/2021  Medication Sig   aspirin 81 MG tablet Take 81 mg by mouth daily.     cetirizine (ZYRTEC) 10 MG tablet Take 10 mg by mouth daily.   folic acid (FOLVITE) 1 MG tablet TAKE ONE TABLET BY MOUTH EVERY MORNING   glimepiride (AMARYL) 2 MG tablet TAKE ONE TABLET BY MOUTH BEFORE BREAKFAST   glucose blood (ONETOUCH ULTRA) test strip USE TO CHECH BLOOD SUGAR DAILY AND AS NEEDED   hydrochlorothiazide (HYDRODIURIL) 12.5 MG tablet Take 1 tablet (12.5 mg total) by mouth daily.   metFORMIN (GLUCOPHAGE) 1000 MG tablet TAKE ONE TABLET BY MOUTH TWICE DAILY   metoprolol tartrate (LOPRESSOR) 25 MG tablet TAKE ONE TABLET BY MOUTH TWICE DAILY   ONETOUCH DELICA LANCETS FINE MISC 1 Stick by Does not apply route daily.   pantoprazole (PROTONIX) 40 MG tablet Take 1 tablet (40 mg total) by mouth daily.   rosuvastatin (CRESTOR) 20 MG tablet Take 1 tablet (20 mg total) by mouth every evening.   Semaglutide, 2 MG/DOSE, (OZEMPIC, 2 MG/DOSE,) 8 MG/3ML SOPN Inject 2 mg into the skin once a week.   valsartan-hydrochlorothiazide (DIOVAN-HCT) 160-25 MG tablet Take 1 tablet by mouth daily.   vitamin B-12 (CYANOCOBALAMIN) 500 MCG tablet Take 500 mcg by mouth daily.    No facility-administered encounter medications on file as of 09/16/2021.   Reviewed chart for medication changes ahead of medication coordination call.  No OVs, Consults, or hospital visits since last care coordination call/Pharmacist visit. (If appropriate, list visit date, provider name)  No medication changes indicated OR if recent visit, treatment plan here.  BP Readings from Last 3 Encounters:  08/12/21 (!) 142/60  05/20/21 130/60   03/04/21 134/74    Lab Results  Component Value Date   HGBA1C 7.2 (H) 08/12/2021     Patient obtains medications through Adherence Packaging  30 Days    Last adherence delivery included: Valsartan-hydrochlorothiazide 160/12.5 mg One tablet daily (breakfast) Rosuvastatin '10mg'$  - take 1 tablet daily with dinner Metformin 1000 mg- 1 tablet twice daily (Breakfast and evening meal) Glimepiride 4 mg One tablet daily (before breakfast) Metoprolol 25 mg One tablet twice daily (Breakfast and evening meal) Folic acid 1 mg - 1 tablet at breakfast   Patient declined last month: Patient did not decline medication last month.   Patient is due for next adherence delivery on: 09/25/2021   Called patient and reviewed medications and coordinated delivery.   This delivery to include: Valsartan-hydrochlorothiazide 160/12.5 mg One tablet daily (breakfast) Rosuvastatin '20mg'$  - take 1 tablet daily with dinner Metformin 1000 mg- 1 tablet twice daily (Breakfast and evening meal) Glimepiride 2 mg One tablet daily (before breakfast) Metoprolol 25 mg One tablet twice daily (Breakfast and evening meal) Folic acid 1 mg - 1 tablet at breakfast Pantoprazole 40 mg once tablet at bedtime Ozempic '8mg'$ /31m inject 2 mg once weekly One touch ultra test strips   Patient will need a short fill: Patient declined any short fills.   Coordinated acute fill: Patient declined any acute fills.   Patient declined the following medications: Patient did not decline any medications.   Confirmed delivery date of 09/25/2021 advised patient that pharmacy will contact them the morning of  delivery.   Reviewed chart prior to disease state call. Spoke with patient regarding BP  Recent Office Vitals: BP Readings from Last 3 Encounters:  08/12/21 (!) 142/60  05/20/21 130/60  03/04/21 134/74   Pulse Readings from Last 3 Encounters:  08/12/21 70  05/20/21 77  03/04/21 71    Wt Readings from Last 3 Encounters:  08/12/21  151 lb 9.6 oz (68.8 kg)  05/20/21 151 lb (68.5 kg)  02/08/21 154 lb 3.2 oz (69.9 kg)     Kidney Function Lab Results  Component Value Date/Time   CREATININE 0.96 08/12/2021 10:26 AM   CREATININE 0.91 05/20/2021 09:48 AM   CREATININE 0.97 12/21/2019 08:54 AM   GFR 60.96 08/12/2021 10:26 AM   GFRNONAA >60 05/12/2018 11:02 AM   GFRAA >60 05/12/2018 11:02 AM       Latest Ref Rng & Units 08/12/2021   10:26 AM 05/20/2021    9:48 AM 02/08/2021   11:38 AM  BMP  Glucose 70 - 99 mg/dL 127   119   237    BUN 6 - 23 mg/dL '15   19   17    '$ Creatinine 0.40 - 1.20 mg/dL 0.96   0.91   1.05    Sodium 135 - 145 mEq/L 136   136   139    Potassium 3.5 - 5.1 mEq/L 4.6   4.3   3.6    Chloride 96 - 112 mEq/L 99   100   98    CO2 19 - 32 mEq/L '27   28   29    '$ Calcium 8.4 - 10.5 mg/dL 9.7   10.3   9.7      Current antihypertensive regimen:  Metoprolol 25 mg twice daily Valsartan HCTZ 160/25 mg once daily  How often are you checking your Blood Pressure? {CHL HP BP Monitoring Frequency:[phone]}  Current home BP readings: ***  What recent interventions/DTPs have been made by any provider to improve Blood Pressure control since last CPP Visit: Recommended checking BP at home a few days a week.  Care Gaps: AWV - scheduled 01/28/2022 Last BP - 142/60 on 08/12/2021 Last A1C - 7.2 on 08/12/2021 Covid booster - overdue  Star Rating Drugs: Glimepiride 4 mg - last filled 08/22/2021 30 [facility] Metformin 1000 mg - last filled 08/22/2021 30 [facility] Rosuvastatin 20 mg - last filled 08/22/2021 30 [facility]  Ozempic 2 mg - last filled 08/22/2021 28 [facility]  Valsartan HCTZ 160/25 mg - last filled as 160/12.5 on 08/20/2021 30 DS at [place] Pharmacist Assistant [phone]

## 2021-10-11 ENCOUNTER — Telehealth: Payer: Self-pay | Admitting: Pharmacist

## 2021-10-11 NOTE — Chronic Care Management (AMB) (Unsigned)
Chronic Care Management Pharmacy Assistant   Name: Andrea [name]  MRN: [id number] DOB: [birth year]  Reason for Encounter: Medication Review / Medication Coordination Call  Recent office visits:  None  Recent consult visits:  None  Hospital visits:  None  Medications: Outpatient Encounter Medications as of 10/11/2021  Medication Sig   aspirin 81 MG tablet Take 81 mg by mouth daily.     cetirizine (ZYRTEC) 10 MG tablet Take 10 mg by mouth daily.   folic acid (FOLVITE) 1 MG tablet TAKE ONE TABLET BY MOUTH EVERY MORNING   glimepiride (AMARYL) 2 MG tablet TAKE ONE TABLET BY MOUTH BEFORE BREAKFAST   glucose blood (ONETOUCH ULTRA) test strip USE TO CHECH BLOOD SUGAR DAILY AND AS NEEDED   hydrochlorothiazide (HYDRODIURIL) 12.5 MG tablet Take 1 tablet (12.5 mg total) by mouth daily.   metFORMIN (GLUCOPHAGE) 1000 MG tablet TAKE ONE TABLET BY MOUTH TWICE DAILY   metoprolol tartrate (LOPRESSOR) 25 MG tablet TAKE ONE TABLET BY MOUTH TWICE DAILY   ONETOUCH DELICA LANCETS FINE MISC 1 Stick by Does not apply route daily.   pantoprazole (PROTONIX) 40 MG tablet Take 1 tablet (40 mg total) by mouth daily.   rosuvastatin (CRESTOR) 20 MG tablet Take 1 tablet (20 mg total) by mouth every evening.   Semaglutide, 2 MG/DOSE, (OZEMPIC, 2 MG/DOSE,) 8 MG/3ML SOPN Inject 2 mg into the skin once a week.   valsartan-hydrochlorothiazide (DIOVAN-HCT) 160-25 MG tablet Take 1 tablet by mouth daily.   vitamin B-12 (CYANOCOBALAMIN) 500 MCG tablet Take 500 mcg by mouth daily.    No facility-administered encounter medications on file as of 10/11/2021.   Reviewed chart for medication changes ahead of medication coordination call.  No OVs, Consults, or hospital visits since last care coordination call/Pharmacist visit. (If appropriate, list visit date, provider name)  No medication changes indicated OR if recent visit, treatment plan here.  BP Readings from Last 3 Encounters:  08/12/21 (!) 142/60  05/20/21  130/60  03/04/21 134/74    Lab Results  Component Value Date   HGBA1C 7.2 (H) 08/12/2021     Patient obtains medications through Adherence Packaging  30 Days    Last adherence delivery included: Valsartan-hydrochlorothiazide 160/12.5 mg One tablet daily (breakfast) Rosuvastatin '20mg'$  - take 1 tablet daily with dinner Metformin 1000 mg- 1 tablet twice daily (Breakfast and evening meal) Glimepiride 2 mg One tablet daily (before breakfast) Metoprolol 25 mg One tablet twice daily (Breakfast and evening meal) Folic acid 1 mg - 1 tablet at breakfast Pantoprazole 40 mg once tablet at bedtime One touch ultra test strips   Patient declined last month: Patient did not decline medication last month.   Patient is due for next adherence delivery on: 10/24/2021   Called patient and reviewed medications and coordinated delivery.   This delivery to include: Metformin 1000 mg- 1 tablet at breakfast and dinner Rosuvastatin '20mg'$  - take 1 tablet daily with dinner Glimepiride 2 mg One tablet before breakfast Valsartan-HCTZ 160/25 mg 1 tablet at breakfast Folic acid 1 mg - 1 tablet at breakfast Metoprolol 25 mg One tablet with breakfast and dinner Pantoprazole 40 mg once tablet at bedtime Ozempic 2 mg/dose  inject 2 mg into the skin weekly.   Patient will need a short fill: No short fill needed   Coordinated acute fill: No acute fill needed   Patient declined the following medications:    One touch ultra test strips (not due til 12/27/2021)  Confirmed delivery date of 10/24/2021, advised  patient that pharmacy will contact them the morning of delivery.  Care Gaps: AWV - scheduled 01/28/2022 Last BP - 142/60 on 08/12/2021 Last A1C - 7.2 on 08/12/2021 Covid booster - overdue  Star Rating Drugs: Glimepiride 2 mg - last filled 09/18/2021 30 [facility] Metformin 1000 mg - last filled 09/18/2021 30 [facility] Rosuvastatin 20 mg - last filled 09/18/2021 30 [facility]  Ozempic 2 mg  - last filled 08/22/2021 28 [facility]  Valsartan HCTZ 160/25 mg - last filled 09/18/2021 30 DS at [place] [phone]

## 2021-11-07 ENCOUNTER — Telehealth: Payer: Self-pay | Admitting: Pharmacist

## 2021-11-07 NOTE — Chronic Care Management (AMB) (Signed)
    Chronic Care Management Pharmacy Assistant   Name: GRACIELYNN BIRKEL  MRN: [id number] DOB: Dec 17, 1955  11/08/2021 APPOINTMENT REMINDER  Sherian Rein Demarco was reminded to have all medications, supplements and any blood glucose and blood pressure readings available for review with [name], Pharm. D, at her telephone visit on 11/08/2021 at 8:30.  Care Gaps: AWV - scheduled 01/28/2022 Last BP - 142/60 on 08/12/2021 Last A1C - 7.2 on 08/12/2021 Covid booster - overdue  Star Rating Drug: Glimepiride 2 mg - last filled 10/17/2021 30 [facility] Metformin 1000 mg - last filled 10/17/2021 30 [facility] Rosuvastatin 20 mg - last filled 10/17/2021 30 [facility]  Ozempic 2 mg - last filled 10/17/2021 28 [facility]  Valsartan HCTZ 160/25 mg - last filled 10/17/2021 30 [facility]  Any gaps in medications fill history? No  Gennie Alma [facility]  Catering manager [phone]

## 2021-11-07 NOTE — Progress Notes (Signed)
Chronic Care Management Pharmacy Note  11/08/2021 Name:  Andrea [name] MRN:  [id number] DOB:  October 01, 1955  Summary: A1c not quite at goal < 7%  BP mostly at goal < 130/80 per home readings Pt is still titrating up on Ozempic using 2 mg pen and is running out of needles   Recommendations/Changes made from today's visit: -Recommend increasing to 52 clicks on the Ozempic pen (from 47 clicks) -Recommended checking blood sugars before bedtime to compare to morning readings -Requested additional pen needles for Ozempic pen   Plan: Scheduled TOC with new PCP Follow up DM assessment in 2-3 weeks   Subjective: Andrea [name] is an 66 y.o. year old female who is a primary patient of Koberlein, Steele Berg, MD (Inactive).  The CCM team was consulted for assistance with disease management and care coordination needs.    Engaged with patient by telephone for follow up visit in response to provider referral for pharmacy case management and/or care coordination services.   Consent to Services:  The patient was given information about Chronic Care Management services, agreed to services, and gave verbal consent prior to initiation of services.  Please see initial visit note for detailed documentation.   Patient Care Team: Caren Macadam, MD (Inactive) as PCP - General (Family Medicine) Rutherford Guys, MD as Consulting Physician (Ophthalmology) [name], [facility] as Pharmacist (Pharmacist)  Recent office visits: 08/12/2021 [name] MD - Patient was seen for Type 2 diabetes mellitus with diabetic neuropathy, without long-term current use of insulin and additional issues. Rosuvastatin increased to 20 mg for triglyceride lowering.  05/20/21 [name], MD: Patient presented for chronic conditions follow up and nausea. Decreased back to 0.25 mg on Ozempic due to nausea. Referred for annual low dose CT scan. Recommended melatonin for sleep. Decreased vitamin B12 supplement.  Recent  consult visits: 09/04/21 Gardiner Barefoot, DPM ([facility]): Patient presented for nail trim.  06/20/21 Rutherford Guys, MD ([facility]): Patient presented for diabetic eye exam.   06/05/21 Gardiner Barefoot, DPM ([facility]): Patient presented for nail trim.  Hospital visits: None in previous 6 months  Objective:  Lab Results  Component Value Date   CREATININE 0.96 08/12/2021   BUN 15 08/12/2021   GFR 60.96 08/12/2021   GFRNONAA >60 05/12/2018   GFRAA >60 05/12/2018   NA 136 08/12/2021   K 4.6 08/12/2021   CALCIUM 9.7 08/12/2021   CO2 27 08/12/2021   GLUCOSE 127 (H) 08/12/2021    Lab Results  Component Value Date/Time   HGBA1C 7.2 (H) 08/12/2021 10:26 AM   HGBA1C 7.0 (A) 05/20/2021 09:02 AM   HGBA1C 7.0 (H) 02/08/2021 11:38 AM   GFR 60.96 08/12/2021 10:26 AM   GFR 65.11 05/20/2021 09:48 AM   MICROALBUR 2.2 (H) 02/08/2021 11:38 AM   MICROALBUR 1.5 12/21/2019 08:54 AM    Last diabetic Eye exam:  Lab Results  Component Value Date/Time   HMDIABEYEEXA No Retinopathy 06/20/2021 12:00 AM    Last diabetic Foot exam:  Lab Results  Component Value Date/Time   HMDIABFOOTEX done 07/01/2009 12:00 AM     Lab Results  Component Value Date   CHOL 121 08/12/2021   HDL 29.30 (L) 08/12/2021   LDLCALC 46 12/21/2019   LDLDIRECT 55.0 08/12/2021   TRIG 350.0 (H) 08/12/2021   CHOLHDL 4 08/12/2021       Latest Ref Rng & Units 08/12/2021   10:26 AM 05/20/2021    9:48 AM 02/08/2021  11:38 AM  Hepatic Function  Total Protein 6.0 - 8.3 g/dL 7.6  7.2  7.4   Albumin 3.5 - 5.2 g/dL 4.3  4.1  4.3   AST 0 - 37 U/L 24  14  13    ALT 0 - 35 U/L 14  9  8    Alk Phosphatase 39 - 117 U/L 55  52  47   Total Bilirubin 0.2 - 1.2 mg/dL 0.4  0.3  0.4     Lab Results  Component Value Date/Time   TSH 3.35 02/08/2021 11:38 AM   TSH 3.29 12/21/2019 08:54 AM       Latest Ref Rng & Units 05/20/2021    9:48 AM 02/08/2021   11:38 AM 12/21/2019    8:54 AM  CBC  WBC 4.0 - 10.5 K/uL  9.7  6.2  7.4   Hemoglobin 12.0 - 15.0 g/dL 12.2  12.6  12.6   Hematocrit 36.0 - 46.0 % 35.6  37.0  37.2   Platelets 150.0 - 400.0 K/uL 239.0  265.0  259     Lab Results  Component Value Date/Time   VD25OH 35 12/21/2019 08:54 AM    Clinical ASCVD: Yes  The ASCVD Risk score (Arnett DK, et al., 2019) failed to calculate for the following reasons:   The valid total cholesterol range is 130 to 320 mg/dL       08/12/2021    9:24 AM 05/20/2021    9:50 AM 01/15/2021    9:41 AM  Depression screen PHQ 2/9  Decreased Interest 0 2 0  Down, Depressed, Hopeless 0 0 0  PHQ - 2 Score 0 2 0  Altered sleeping 0 2   Tired, decreased energy 0 2   Change in appetite 0 2   Feeling bad or failure about yourself  0 2   Trouble concentrating 0 0   Moving slowly or fidgety/restless 0 0   Suicidal thoughts 0 0   PHQ-9 Score 0 10      Social History   Tobacco Use  Smoking Status Former   Types: E-cigarettes   Quit date: 06/20/2010   Years since quitting: 11.3  Smokeless Tobacco Never  Tobacco Comments   smoked 1ppd for> 30 years/smokes occasionally,some vapor cigarettes   BP Readings from Last 3 Encounters:  08/12/21 (!) 142/60  05/20/21 130/60  03/04/21 134/74   Pulse Readings from Last 3 Encounters:  08/12/21 70  05/20/21 77  03/04/21 71   Wt Readings from Last 3 Encounters:  08/12/21 151 lb 9.6 oz (68.8 kg)  05/20/21 151 lb (68.5 kg)  02/08/21 154 lb 3.2 oz (69.9 kg)   BMI Readings from Last 3 Encounters:  08/12/21 26.85 kg/m  05/20/21 26.75 kg/m  02/08/21 27.32 kg/m    Assessment/Interventions: Review of patient past medical history, allergies, medications, health status, including review of consultants reports, laboratory and other test data, was performed as part of comprehensive evaluation and provision of chronic care management services.   SDOH:  (Social Determinants of Health) assessments and interventions performed: Yes  SDOH Interventions    Flowsheet Row Most  Recent Value  SDOH Interventions   Financial Strain Interventions Intervention Not Indicated      SDOH Screenings   Alcohol Screen: Not on file  Depression (PHQ2-9): Low Risk  (08/12/2021)   Depression (PHQ2-9)    PHQ-2 Score: 0  Recent Concern: Depression (PHQ2-9) - Medium Risk (05/20/2021)   Depression (PHQ2-9)    PHQ-2 Score: 10  Financial Resource Strain: Low  Risk  (11/08/2021)   Overall Financial Resource Strain (CARDIA)    Difficulty of Paying Living Expenses: Not very hard  Food Insecurity: No Food Insecurity (01/15/2021)   Hunger Vital Sign    Worried About Running Out of Food in the Last Year: Never true    Ran Out of Food in the Last Year: Never true  Housing: Low Risk  (01/15/2021)   Housing    Last Housing Risk Score: 0  Physical Activity: Insufficiently Active (01/15/2021)   Exercise Vital Sign    Days of Exercise per Week: 3 days    Minutes of Exercise per Session: 20 min  Social Connections: Socially Isolated (01/15/2021)   Social Connection and Isolation Panel [NHANES]    Frequency of Communication with Friends and Family: Three times a week    Frequency of Social Gatherings with Friends and Family: More than three times a week    Attends Religious Services: Never    Marine scientist or Organizations: No    Attends Archivist Meetings: Never    Marital Status: Divorced  Stress: No Stress Concern Present (01/15/2021)   [employer] [place]    Feeling of Stress : Not at all  Tobacco Use: Medium Risk (09/04/2021)   Patient History    Smoking Tobacco Use: Former    Smokeless Tobacco Use: Never    Passive Exposure: Not on file  Transportation Needs: No Transportation Needs (01/15/2021)   PRAPARE - Hydrologist (Medical): No    Lack of Transportation (Non-Medical): No    CCM Care Plan  Allergies  Allergen Reactions   Plavix [Clopidogrel Bisulfate]     Caused  platelets to drop    Medications Reviewed Today     Reviewed by [name], [facility] (Pharmacist) on 11/08/21 at [phone]  Med List Status: <None>   Medication Order Taking? Sig Documenting Provider Last Dose Status Informant  aspirin 81 MG tablet [id number]  Take 81 mg by mouth daily.   [provider]  Active Self  cetirizine (ZYRTEC) 10 MG tablet [id number]  Take 10 mg by mouth daily. [provider]  Active Self  folic acid (FOLVITE) 1 MG tablet [id number]  TAKE ONE TABLET BY MOUTH EVERY MORNING Koberlein, Junell C, MD  Active   glimepiride (AMARYL) 2 MG tablet [id number]  TAKE ONE TABLET BY MOUTH BEFORE BREAKFAST Koberlein, Junell C, MD  Active   glucose blood (ONETOUCH ULTRA) test strip [id number]  USE TO [facility] BLOOD SUGAR DAILY AND AS NEEDED Koberlein, Junell C, MD  Active   metFORMIN (GLUCOPHAGE) 1000 MG tablet [id number]  TAKE ONE TABLET BY MOUTH TWICE DAILY Koberlein, Junell C, MD  Active   metoprolol tartrate (LOPRESSOR) 25 MG tablet [id number]  TAKE ONE TABLET BY MOUTH TWICE DAILY Caren Macadam, MD  Active   [facility] LANCETS FINE MISC [id number]  1 Stick by Does not apply route daily. Ricard Dillon, MD  Active Self  pantoprazole (PROTONIX) 40 MG tablet [id number]  Take 1 tablet (40 mg total) by mouth daily. Caren Macadam, MD  Active   rosuvastatin (CRESTOR) 20 MG tablet [id number]  Take 1 tablet (20 mg total) by mouth every evening. Caren Macadam, MD  Active   Semaglutide, 2 MG/DOSE, (OZEMPIC, 2 MG/DOSE,) 8 MG/3ML SOPN [id number]  Inject 2 mg into the skin once a week. Caren Macadam, MD  Active   valsartan-hydrochlorothiazide (DIOVAN-HCT) 160-25 MG tablet [id number]  Yes Take 1 tablet by mouth daily. Caren Macadam, MD Taking Active   vitamin B-12 (CYANOCOBALAMIN) 500 MCG tablet [id number]  Take 500 mcg by mouth daily.  [provider]  Active Self            Patient Active Problem List   Diagnosis Date Noted   Hyperlipidemia  associated with type 2 diabetes mellitus ([place]) 09/09/2018   B12 deficiency 09/09/2018   Pseudophakia 05/15/2016   Type 2 diabetes mellitus with diabetic neuropathy, without long-term current use of insulin ([place]) 05/24/2015   Hx of adenomatous colonic polyps 08/09/2014   Osteoarthritis 05/07/2007   Hypertension associated with diabetes ([place]) 11/19/2006   Coronary atherosclerosis 11/09/2006    Immunization History  Administered Date(s) Administered   Fluad Quad(high Dose 65+) 12/21/2019, 02/06/2021   Influenza Whole 01/12/1997, 01/05/2009   Influenza,inj,Quad PF,6+ Mos 01/12/2015, 12/27/2015, 03/09/2017, 12/01/2018   Influenza-Unspecified 02/12/2014, 01/19/2018   PFIZER(Purple Top)SARS-COV-2 Vaccination 07/07/2019, 08/01/2019, 02/06/2021   Pneumococcal Conjugate-13 12/01/2018   Pneumococcal Polysaccharide-23 04/24/2008, 01/31/2014, 12/21/2019   Td 11/13/1997, 04/27/2009   Tdap 05/20/2020   Zoster Recombinat (Shingrix) 08/22/2017, 11/12/2017, 01/12/2018   Patient reports she is not feeling any worse right now. She is still getting headaches every once in a while and this is right between her eyes. She doesn't get them very often. Patient sometimes takes 2 extra strength Tylenol for this and it goes away.  Conditions to be addressed/monitored:  Hypertension, Hyperlipidemia, Diabetes and vitamin B12 deficiency  Conditions addressed this visit: Diabetes, hypertension  Care Plan : CCM Pharmacy Care Plan  Updates made by [name], [place] since 11/08/2021 12:00 AM     Problem: Problem: Hypertension, Hyperlipidemia, Diabetes and vitamin B12 deficiency      Long-Range Goal: Patient-Specific Goal   Start Date: 07/17/2020  Expected End Date: 07/17/2021  Recent Progress: On track  Priority: High  Note:   Current Barriers:  Unable to independently monitor therapeutic efficacy Unable to maintain control of  diabetes and cholesterol  Pharmacist Clinical Goal(s):  Patient will achieve  adherence to monitoring guidelines and medication adherence to achieve therapeutic efficacy through collaboration with PharmD and provider.   Interventions: 1:1 collaboration with Caren Macadam, MD regarding development and update of comprehensive plan of care as evidenced by provider attestation and co-signature Inter-disciplinary care team collaboration (see longitudinal plan of care) Comprehensive medication review performed; medication list updated in electronic medical record  Hypertension (BP goal <130/80) -Controlled -Current treatment: Metoprolol tartrate (Lopressor) 104m, 0.5 (one-half) tablet twice daily - Appropriate, Effective, Safe, Accessible Valsartan/HCTZ 160/25 mg, 1 tablet once daily - Appropriate, Effective, Safe, Accessible -Medications previously tried: BScientist, water quality([employer]ul[employer]) -Current home readings: 113/56, 132/61, 112/54 (same day as 119/56), 129/67, 130/63, 130/68, 142/70, 139/64, 134/61 (avg: 128/62) -Current dietary habits: only using salt when eating tomato sandwiches; does not use on any thing else -Current exercise habits: not walking consistently - was walking 15-20 minutes per day -Denies hypotensive/hypertensive symptoms -Educated on Exercise goal of 150 minutes per week; Importance of home blood pressure monitoring; -Counseled to monitor BP at home weekly, document, and provide log at future appointments -Counseled on diet and exercise extensively Recommended to continue current medication  Hyperlipidemia/coronary atherosclerosis: (LDL goal < 70, TG <150 ) -Controlled for LDL; TGs uncontrolled -Current treatment: Rosuvastatin (Crestor) 269m 1 tablet once daily - Appropriate, Query effective, Safe, Accessible ASA 8154m1 tablet once daily - Appropriate, Effective, Safe, Accessible -Medications previously tried: none  -Current dietary patterns: focusing on decreasing bread and  soda -Current exercise habits: not consistently  exercising -Educated on Cholesterol goals;  Exercise goal of 150 minutes per week; -Counseled on diet and exercise extensively Recommended to continue current medication  Diabetes (A1c goal <7%) -Not ideally controlled -Current medications: glimepiride (Amaryl) 87m, 1 tablet once daily before breakfast - Appropriate, Query effective, Safe, Accessible Metformin 10037m 1 tablet twice daily - Appropriate, Query effective, Safe, Accessible Ozempic 1 mg inject once weekly - Appropriate, Query effective, Safe, Accessible -Medications previously tried: Onglyza (cost)   -Current home glucose readings: 7 days 138 (9 numbers), 14 days 151 (18 numbers), 151 (30 days) fasting glucose: 115 (afternoon before dinner), 131, 159 (before breakfast), 141, 87, 89 (afternoon), 162 (am), 133, 126, 137 (am) post prandial glucose: sometimes before and sometimes after  Lowest: 80 (before bedtime)  -Denies hypoglycemic/hyperglycemic symptoms -Current meal patterns:  breakfast: eggs, grits & bacon and toast lunch: did not discuss  dinner[place]; cereal snacks: not many desserts, peanut butter crackers drinks: 1/2 can of soda and juice, not much water -Current exercise: not exercising consistently; was walking 3 times a week for about 20 minutes but is having more knee pain/popping -Educated on A1c and blood sugar goals; Exercise goal of 150 minutes per week; Benefits of routine self-monitoring of blood sugar; Carbohydrate counting and/or plate method -Counseled to check feet daily and get yearly eye exams -Counseled on diet and exercise extensively Collaborated with to increase Ozempic up to 2 mg as long as patient tolerates in order to back down on glimepiride.  Vitamin B12 deficiency (Goal: vitamin B12 211-911) -Controlled -Current treatment  vitamin B12 50031m 1 tablet once daily - Appropriate, Effective, Safe, Accessible -Medications previously tried: none  -Recommended to  continue current medication  Health Maintenance -Vaccine gaps: tetanus, COVID booster -Current therapy:  Cetirizine 10 mg 1 tablet daily -Educated on Cost vs benefit of each product must be carefully weighed by individual consumer -Patient is satisfied with current therapy and denies issues -Recommended to continue current medication  Patient Goals/Self-Care Activities Patient will:  - take medications as prescribed check glucose daily, document, and provide at future appointments check blood pressure weekly, document, and provide at future appointments  Follow Up Plan: The care management team will reach out to the patient again over the next 7 days.        Medication Assistance: None required.  Patient affirms current coverage meets needs.  Compliance/Adherence/Medication fill history: Care Gaps: COVID booster Last BP - 142/60 on 08/12/2021 Last A1C - 7.2 on 08/12/2021    Star-Rating Drugs: Glimepiride 2 mg - last filled 10/17/2021 30 DS at [facility] Metformin 1000 mg - last filled 10/17/2021 30 DS at [facility] Rosuvastatin 20 mg - last filled 10/17/2021 30 [facility] at [facility]  Ozempic 2 mg - last filled 10/17/2021 28 DS at [facility]  Valsartan HCTZ 160/25 mg -  last filled 10/17/2021 28 [facility] at [facility]    Patient's preferred pharmacy is:  [facility] - [id number] [id number]: [phone]: [phone] [address] [id number]: [phone]: [phone] pill box? Yes - restocks on Wednesdays Pt endorses 100% compliance (sometime she might forget)  We discussed: Benefits of medication synchronization, packaging and delivery as well as enhanced pharmacist oversight with [facility]. Patient  decided to: Utilize [facility] pharmacy for  medication synchronization, packaging and delivery  Care Plan and Follow Up Patient Decision:  Patient agrees to Care Plan and Follow-up.  Plan: The care management team will reach out to the patient again over the next 7 days.  [name], PharmD [facility] Clinical Pharmacist [place] [facility] [place]  Encounter details: CCM Time Spent       Value Time User   Total time (minutes)  0 11/08/2021  8:52 AM [name], RPH       I have personally reviewed this encounter including the documentation in this note and have collaborated with the care management provider regarding care management and care coordination activities to include development and update of the comprehensive care plan. I am certifying that I agree with the content of this note and encounter as supervising physician/advanced practice provider.

## 2021-11-08 ENCOUNTER — Ambulatory Visit: Payer: Medicare Other | Admitting: Pharmacist

## 2021-11-08 DIAGNOSIS — E1140 Type 2 diabetes mellitus with diabetic neuropathy, unspecified: Secondary | ICD-10-CM

## 2021-11-08 DIAGNOSIS — I152 Hypertension secondary to endocrine disorders: Secondary | ICD-10-CM

## 2021-11-08 DIAGNOSIS — E1169 Type 2 diabetes mellitus with other specified complication: Secondary | ICD-10-CM

## 2021-11-08 NOTE — Patient Instructions (Signed)
Hi Andrea [name],  Go ahead and increase the Ozempic to 52 clicks like we discussed. Also please try to check your readings before bedtime so we can get a better idea of if they are on the higher or lower side in order to make an impact on your morning readings.  Please reach out to me if you have any questions or need anything!  Best, Andrea [name]  Andrea [name], PharmD, Andrea [name] at [place]   Visit Information   Goals Addressed   None    Patient Care Plan: CCM Pharmacy Care Plan     Problem Identified: Problem: Hypertension, Hyperlipidemia, Diabetes and vitamin B12 deficiency      Long-Range Goal: Patient-Specific Goal   Start Date: 07/17/2020  Expected End Date: 07/17/2021  Recent Progress: On track  Priority: High  Note:   Current Barriers:  Unable to independently monitor therapeutic efficacy Unable to maintain control of  diabetes and cholesterol  Pharmacist Clinical Goal(s):  Patient will achieve adherence to monitoring guidelines and medication adherence to achieve therapeutic efficacy through collaboration with PharmD and provider.   Interventions: 1:1 collaboration with Andrea Macadam, MD regarding development and update of comprehensive plan of care as evidenced by provider attestation and co-signature Inter-disciplinary care team collaboration (see longitudinal plan of care) Comprehensive medication review performed; medication list updated in electronic medical record  Hypertension (BP goal <130/80) -Controlled -Current treatment: Metoprolol tartrate (Lopressor) '50mg'$ , 0.5 (one-half) tablet twice daily - Appropriate, Effective, Safe, Accessible Valsartan/HCTZ 160/25 mg, 1 tablet once daily - Appropriate, Effective, Safe, Accessible -Medications previously tried: Scientist, water quality [employer] formulary) -Current home readings: 113/56, 132/61, 112/54 (same day as 119/56), 129/67, 130/63, 130/68, 142/70, 139/64, 134/61 (avg:  128/62) -Current dietary habits: only using salt when eating tomato sandwiches; does not use on any thing else -Current exercise habits: not walking consistently - was walking 15-20 minutes per day -Denies hypotensive/hypertensive symptoms -Educated on Exercise goal of 150 minutes per week; Importance of home blood pressure monitoring; -Counseled to monitor BP at home weekly, document, and provide log at future appointments -Counseled on diet and exercise extensively Recommended to continue current medication  Hyperlipidemia/coronary atherosclerosis: (LDL goal < 70, TG <150 ) -Controlled for LDL; TGs uncontrolled -Current treatment: Rosuvastatin (Crestor) '20mg'$ , 1 tablet once daily - Appropriate, Query effective, Safe, Accessible ASA '81mg'$ , 1 tablet once daily - Appropriate, Effective, Safe, Accessible -Medications previously tried: none  -Current dietary patterns: focusing on decreasing bread and soda -Current exercise habits: not consistently exercising -Educated on Cholesterol goals;  Exercise goal of 150 minutes per week; -Counseled on diet and exercise extensively Recommended to continue current medication  Diabetes (A1c goal <7%) -Not ideally controlled -Current medications: glimepiride (Amaryl) '2mg'$ , 1 tablet once daily before breakfast - Appropriate, Query effective, Safe, Accessible Metformin '1000mg'$ , 1 tablet twice daily - Appropriate, Query effective, Safe, Accessible Ozempic 1 mg inject once weekly - Appropriate, Query effective, Safe, Accessible -Medications previously tried: Onglyza (cost)   -Current home glucose readings: 7 days 138 (9 numbers), 14 days 151 (18 numbers), 151 (30 days) fasting glucose: 115 (afternoon before dinner), 131, 159 (before breakfast), 141, 87, 89 (afternoon), 162 (am), 133, 126, 137 (am) post prandial glucose: sometimes before and sometimes after  Lowest: 80 (before bedtime)  -Denies hypoglycemic/hyperglycemic symptoms -Current meal patterns:   breakfast: eggs, grits & bacon and toast lunch: did not discuss  dinner: [place] last night; cereal snacks: not many desserts, peanut butter crackers drinks: 1/2 can of soda and juice, not much  water -Current exercise: not exercising consistently; was walking 3 times a week for about 20 minutes but is having more knee pain/popping -Educated on A1c and blood sugar goals; Exercise goal of 150 minutes per week; Benefits of routine self-monitoring of blood sugar; Carbohydrate counting and/or plate method -Counseled to check feet daily and get yearly eye exams -Counseled on diet and exercise extensively Collaborated with to increase Ozempic up to 2 mg as long as patient tolerates in order to back down on glimepiride.  Vitamin B12 deficiency (Goal: vitamin B12 211-911) -Controlled -Current treatment  vitamin B12 553mg, 1 tablet once daily - Appropriate, Effective, Safe, Accessible -Medications previously tried: none  -Recommended to continue current medication  Health Maintenance -Vaccine gaps: tetanus, COVID booster -Current therapy:  Cetirizine 10 mg 1 tablet daily -Educated on Cost vs benefit of each product must be carefully weighed by individual consumer -Patient is satisfied with current therapy and denies issues -Recommended to continue current medication  Patient Goals/Self-Care Activities Patient will:  - take medications as prescribed check glucose daily, document, and provide at future appointments check blood pressure weekly, document, and provide at future appointments  Follow Up Plan: The care management team will reach out to the patient again over the next 7 days.         Patient verbalizes understanding of instructions and care plan provided today and agrees to [place] in [place] status and patient understanding of how to access instructions and care plan via MyChart confirmed with patient.    The pharmacy team will reach out to the  patient again over the next 7 days.   MViona Gilmore RO[facility]

## 2021-11-11 ENCOUNTER — Telehealth: Payer: Self-pay | Admitting: *Deleted

## 2021-11-11 MED ORDER — INSULIN PEN NEEDLE 32G X 4 MM MISC: 3 refills | Status: AC

## 2021-11-11 NOTE — Addendum Note (Signed)
Addended by: Agnes Lawrence on: 11/11/2021 01:36 PM   Modules accepted: Orders

## 2021-11-11 NOTE — Progress Notes (Signed)
Rx done. 

## 2021-11-11 NOTE — Telephone Encounter (Signed)
Rx done. 

## 2021-11-11 NOTE — Telephone Encounter (Signed)
-----   Message from Farrel Conners, MD sent at 11/11/2021 10:49 AM EDT ----- Ok to refill the 32 G x 4 mm pen needles, I would give her 4 per month and 3 refills ----- Message ----- From: [name], [facility] Sent: 11/08/2021   9:25 AM EDT To: Farrel Conners, MD  Hi,  I had a visit with your future patient Ms. Mcguffee and with Dr. Ethlyn Gallery we were titrating her Ozempic slowly with the goal of getting her to the maximum tolerated dose. Her med list says 2 mg but she is not actually taking that much - she had a hard time tolerating so she is just slowly increasing the number of clicks (she is just past 1 mg right now) so then pens are lasting a lot longer than what the package says and she is running out of pen needles as there are only 4 in each box. Would you be able to send in a prescription for 32 G x 75m pen needles to [facility] for her?  Let me know!  Thanks, Maddie

## 2021-11-13 ENCOUNTER — Telehealth: Payer: Self-pay | Admitting: Pharmacist

## 2021-11-13 NOTE — Chronic Care Management (AMB) (Signed)
Chronic Care Management Pharmacy Assistant   Name: JAZYIAH YIU  MRN: [id number] DOB: December 26, 1955  Reason for Encounter: Medication Review / Medication Coordination Call   Recent office visits:  None  Recent consult visits:  None  Hospital visits:  None  Medications: Outpatient Encounter Medications as of 11/13/2021  Medication Sig   aspirin 81 MG tablet Take 81 mg by mouth daily.     cetirizine (ZYRTEC) 10 MG tablet Take 10 mg by mouth daily.   folic acid (FOLVITE) 1 MG tablet TAKE ONE TABLET BY MOUTH EVERY MORNING   glimepiride (AMARYL) 2 MG tablet TAKE ONE TABLET BY MOUTH BEFORE BREAKFAST   glucose blood (ONETOUCH ULTRA) test strip USE TO CHECH BLOOD SUGAR DAILY AND AS NEEDED   Insulin Pen Needle 32G X 4 MM MISC Use as directed   metFORMIN (GLUCOPHAGE) 1000 MG tablet TAKE ONE TABLET BY MOUTH TWICE DAILY   metoprolol tartrate (LOPRESSOR) 25 MG tablet TAKE ONE TABLET BY MOUTH TWICE DAILY   ONETOUCH DELICA LANCETS FINE MISC 1 Stick by Does not apply route daily.   pantoprazole (PROTONIX) 40 MG tablet Take 1 tablet (40 mg total) by mouth daily.   rosuvastatin (CRESTOR) 20 MG tablet Take 1 tablet (20 mg total) by mouth every evening.   Semaglutide, 2 MG/DOSE, (OZEMPIC, 2 MG/DOSE,) 8 MG/3ML SOPN Inject 2 mg into the skin once a week.   valsartan-hydrochlorothiazide (DIOVAN-HCT) 160-25 MG tablet Take 1 tablet by mouth daily.   vitamin B-12 (CYANOCOBALAMIN) 500 MCG tablet Take 500 mcg by mouth daily.    No facility-administered encounter medications on file as of 11/13/2021.   Reviewed chart for medication changes ahead of medication coordination call.  No OVs, Consults, or hospital visits since last care coordination call/Pharmacist visit. (If appropriate, list visit date, provider name)  No medication changes indicated OR if recent visit, treatment plan here.  BP Readings from Last 3 Encounters:  08/12/21 (!) 142/60  05/20/21 130/60  03/04/21 134/74    Lab Results   Component Value Date   HGBA1C 7.2 (H) 08/12/2021     Patient obtains medications through Adherence Packaging  30 Days    Last adherence delivery included: Metformin 1000 mg- 1 tablet at breakfast and dinner Rosuvastatin '20mg'$  - take 1 tablet daily with dinner Glimepiride 2 mg One tablet before breakfast Valsartan-HCTZ 160/25 mg 1 tablet at breakfast Folic acid 1 mg - 1 tablet at breakfast Metoprolol 25 mg One tablet with breakfast and dinner Pantoprazole 40 mg once tablet at bedtime Ozempic 2 mg/dose  inject 2 mg into the skin weekly.   Patient declined last month: Patient did not decline medication last month.   Patient is due for next adherence delivery on: 11/25/2021   Called patient and reviewed medications and coordinated delivery.   This delivery to include: Metformin 1000 mg- 1 tablet at breakfast and dinner Rosuvastatin '20mg'$  - take 1 tablet daily with dinner Glimepiride 2 mg One tablet before breakfast Valsartan-HCTZ 160/25 mg 1 tablet at breakfast Folic acid 1 mg - 1 tablet at breakfast Metoprolol 25 mg One tablet with breakfast and dinner Pantoprazole 40 mg once tablet at bedtime Ozempic 2 mg/dose  inject 2 mg into the skin weekly.  Pen Needles 32 gauge    Patient will need a short fill: No short fill needed   Coordinated acute fill: No acute fill needed   Patient declined the following medications:    One touch ultra test strips (not due til 12/27/2021)  Confirmed delivery date of 11/25/2021, advised patient that pharmacy will contact them the morning of delivery.  Care Gaps: AWV - scheduled 01/28/2022 Last BP - 142/60 on 08/12/2021 Last A1C - 7.2 on 08/12/2021 Covid booster - overdue Flu - due  Star Rating Drugs: Glimepiride 2 mg - last filled 10/17/2021 30 DS at [facility] Metformin 1000 mg - last filled 10/17/2021 30 DS at [facility] Rosuvastatin 20 mg - last filled 10/17/2021 30 DS at [facility]  Ozempic 2 mg - last filled 10/17/2021 28 DS at [facility]   Valsartan HCTZ 160/25 mg - last filled 10/17/2021 30 DS at [place] [phone]

## 2021-12-12 ENCOUNTER — Telehealth: Payer: Self-pay | Admitting: Pharmacist

## 2021-12-12 NOTE — Chronic Care Management (AMB) (Signed)
Chronic Care Management Pharmacy Assistant   Name: Andrea [name]  MRN: [id number] DOB: 1955-04-21  Reason for Encounter: Medication Review / Medication Coordination Call   Recent office visits:  None  Recent consult visits:  None  Hospital visits:  None  Medications: Outpatient Encounter Medications as of 12/12/2021  Medication Sig   aspirin 81 MG tablet Take 81 mg by mouth daily.     cetirizine (ZYRTEC) 10 MG tablet Take 10 mg by mouth daily.   folic acid (FOLVITE) 1 MG tablet TAKE ONE TABLET BY MOUTH EVERY MORNING   glimepiride (AMARYL) 2 MG tablet TAKE ONE TABLET BY MOUTH BEFORE BREAKFAST   glucose blood (ONETOUCH ULTRA) test strip USE TO CHECH BLOOD SUGAR DAILY AND AS NEEDED   Insulin Pen Needle 32G X 4 MM MISC Use as directed   metFORMIN (GLUCOPHAGE) 1000 MG tablet TAKE ONE TABLET BY MOUTH TWICE DAILY   metoprolol tartrate (LOPRESSOR) 25 MG tablet TAKE ONE TABLET BY MOUTH TWICE DAILY   ONETOUCH DELICA LANCETS FINE MISC 1 Stick by Does not apply route daily.   pantoprazole (PROTONIX) 40 MG tablet Take 1 tablet (40 mg total) by mouth daily.   rosuvastatin (CRESTOR) 20 MG tablet Take 1 tablet (20 mg total) by mouth every evening.   Semaglutide, 2 MG/DOSE, (OZEMPIC, 2 MG/DOSE,) 8 MG/3ML SOPN Inject 2 mg into the skin once a week.   valsartan-hydrochlorothiazide (DIOVAN-HCT) 160-25 MG tablet Take 1 tablet by mouth daily.   vitamin B-12 (CYANOCOBALAMIN) 500 MCG tablet Take 500 mcg by mouth daily.    No facility-administered encounter medications on file as of 12/12/2021.   Reviewed chart for medication changes ahead of medication coordination call.  No OVs, Consults, or hospital visits since last care coordination call/Pharmacist visit. (If appropriate, list visit date, provider name)  No medication changes indicated OR if recent visit, treatment plan here.  BP Readings from Last 3 Encounters:  08/12/21 (!) 142/60  05/20/21 130/60  03/04/21 134/74    Lab Results   Component Value Date   HGBA1C 7.2 (H) 08/12/2021     Patient obtains medications through Adherence Packaging  30 Days    Last adherence delivery included: Metformin 1000 mg- 1 tablet at breakfast and dinner Rosuvastatin '20mg'$  - take 1 tablet daily with dinner Glimepiride 2 mg One tablet before breakfast Valsartan-HCTZ 160/25 mg 1 tablet at breakfast Folic acid 1 mg - 1 tablet at breakfast Metoprolol 25 mg One tablet with breakfast and dinner Pantoprazole 40 mg once tablet at bedtime Ozempic 2 mg/dose  inject 2 mg into the skin weekly.  Pen Needles 32 gauge    Patient declined last month: Patient did not decline medication last month.   Patient is due for next adherence delivery on: 12/24/2021   Called patient and reviewed medications and coordinated delivery.   This delivery to include: Metformin 1000 mg- 1 tablet at breakfast and dinner Rosuvastatin '20mg'$  - take 1 tablet daily with dinner Glimepiride 2 mg One tablet before breakfast Valsartan-HCTZ 160/25 mg 1 tablet at breakfast Folic acid 1 mg - 1 tablet at breakfast Metoprolol 25 mg One tablet with breakfast and dinner Pantoprazole 40 mg once tablet at bedtime Ozempic 2 mg/dose  inject 2 mg into the skin weekly.  One touch ultra test strips    Patient will need a short fill: No short fill needed   Coordinated acute fill: No acute fill needed   Patient declined the following medications:   Pen Needles 32 gauge (not  due til 02/27/2022)   Confirmed delivery date of 12/24/2021 advised patient that pharmacy will contact them the morning of delivery.  Care Gaps: AWV - scheduled 01/28/2022 Last BP - 142/60 on 08/12/2021 Last A1C - 7.2 on 08/12/2021 Covid booster - overdue Flu - due  Star Rating Drugs: Glimepiride 2 mg - last filled 11/19/2021 30 DS at [facility] Metformin 1000 mg - last filled 11/19/2021 30 DS at [facility] Rosuvastatin 20 mg - last filled 11/19/2021 30 DS at [facility]  Ozempic 2 mg - last filled 11/19/2021 28 DS at  [facility]  Valsartan HCTZ 160/25 mg - last filled 11/19/2021 30 DS at [place] [phone]

## 2021-12-31 ENCOUNTER — Encounter: Payer: Self-pay | Admitting: Family Medicine

## 2021-12-31 ENCOUNTER — Ambulatory Visit (INDEPENDENT_AMBULATORY_CARE_PROVIDER_SITE_OTHER): Payer: Medicare Other | Admitting: Family Medicine

## 2021-12-31 VITALS — BP 140/62 | HR 79 | Temp 98.3°F | Ht 63.0 in | Wt 147.2 lb

## 2021-12-31 DIAGNOSIS — I10 Essential (primary) hypertension: Secondary | ICD-10-CM

## 2021-12-31 DIAGNOSIS — Z23 Encounter for immunization: Secondary | ICD-10-CM | POA: Diagnosis not present

## 2021-12-31 DIAGNOSIS — E1140 Type 2 diabetes mellitus with diabetic neuropathy, unspecified: Secondary | ICD-10-CM | POA: Diagnosis not present

## 2021-12-31 LAB — POCT GLYCOSYLATED HEMOGLOBIN (HGB A1C): Hemoglobin A1C: 6.1 % — AB (ref 4.0–5.6)

## 2021-12-31 MED ORDER — OZEMPIC (2 MG/DOSE) 8 MG/3ML ~~LOC~~ SOPN: 2.0000 mg | PEN_INJECTOR | SUBCUTANEOUS | 5 refills | Status: DC

## 2021-12-31 MED ORDER — GLIMEPIRIDE 2 MG PO TABS: ORAL_TABLET | ORAL | 1 refills | Status: DC

## 2021-12-31 MED ORDER — AMLODIPINE BESYLATE 2.5 MG PO TABS: 2.5000 mg | ORAL_TABLET | Freq: Every day | ORAL | 1 refills | Status: DC

## 2021-12-31 MED ORDER — METFORMIN HCL 1000 MG PO TABS: ORAL_TABLET | ORAL | 1 refills | Status: DC

## 2021-12-31 NOTE — Patient Instructions (Signed)
ADD amlodipine 2.5 mg once daily for better BP control

## 2021-12-31 NOTE — Progress Notes (Signed)
Established Patient Office Visit  Subjective   Patient ID: Andrea [name], female    DOB: 09-06-1955  Age: 66 y.o. MRN: [id number]  Chief Complaint  Patient presents with   Establish Care    HTN -- Pt reports her BP cuff at home is out of batteries but before she has been getting readings in the 140's. Is taking her medication at home, no side effects reported. She reports she is compliant with her medications at home, she denies dizziness, chest pain and SOB.   DM-- A1C performed in office today and is 6.1, on metformin 1000 BID, ozempic 2 mg weekly and glimepiride 2 mg daily. She reports she is tolerating these medications well, denies hypoglycemia. She is UTD on her foot exam and her eye exam. I reviewed her last set of labs, GFR and microalbumin are also UTD.  HM- health maintenance reviewed, she is UTD on mammogram, colonoscopy. She is agreeable to getting the flu shot today.      Current Outpatient Medications  Medication Instructions   aspirin 81 mg, Oral, Daily   cetirizine (ZYRTEC) 10 mg, Oral, Daily   folic acid (FOLVITE) 1 MG tablet TAKE ONE TABLET BY MOUTH EVERY MORNING   glimepiride (AMARYL) 2 MG tablet TAKE ONE TABLET BY MOUTH BEFORE BREAKFAST   glucose blood (ONETOUCH ULTRA) test strip USE TO CHECH BLOOD SUGAR DAILY AND AS NEEDED   Insulin Pen Needle 32G X 4 MM MISC Use as directed   metFORMIN (GLUCOPHAGE) 1000 MG tablet TAKE ONE TABLET BY MOUTH TWICE DAILY   metoprolol tartrate (LOPRESSOR) 25 MG tablet TAKE ONE TABLET BY MOUTH TWICE DAILY   ONETOUCH DELICA LANCETS FINE MISC 1 Stick, Does not apply, Daily   Ozempic (2 MG/DOSE) 2 mg, Subcutaneous, Weekly   pantoprazole (PROTONIX) 40 mg, Oral, Daily   rosuvastatin (CRESTOR) 20 mg, Oral, Every evening   valsartan-hydrochlorothiazide (DIOVAN-HCT) 160-25 MG tablet 1 tablet, Oral, Daily   vitamin B-12 (CYANOCOBALAMIN) 500 mcg, Oral, Daily     Patient Active Problem List   Diagnosis Date Noted   Hyperlipidemia  associated with type 2 diabetes mellitus ([place]) 09/09/2018   B12 deficiency 09/09/2018   Pseudophakia 05/15/2016   Type 2 diabetes mellitus with diabetic neuropathy, without long-term current use of insulin ([place]) 05/24/2015   Hx of adenomatous colonic polyps 08/09/2014   Osteoarthritis 05/07/2007   HTN (hypertension) 11/19/2006   Coronary atherosclerosis 11/09/2006      Review of Systems  All other systems reviewed and are negative.     Objective:     BP (!) 140/62 (BP Location: Left Arm, Patient Position: Sitting, Cuff Size: Normal)   Pulse 79   Temp 98.3 F (36.8 C) (Oral)   Ht '5\' 3"'$  (1.6 m)   Wt 147 lb 3.2 oz (66.8 kg)   SpO2 97%   BMI 26.08 kg/m  BP Readings from Last 3 Encounters:  12/31/21 (!) 140/62  08/12/21 (!) 142/60  05/20/21 130/60      Physical Exam Vitals reviewed.  Constitutional:      Appearance: Normal appearance. She is well-groomed and normal weight.  Eyes:     Conjunctiva/sclera: Conjunctivae normal.  Neck:     Thyroid: No thyromegaly.  Cardiovascular:     Rate and Rhythm: Normal rate and regular rhythm.     Pulses: Normal pulses.     Heart sounds: S1 normal and S2 normal.  Pulmonary:     Effort: Pulmonary effort is normal.     Breath sounds: Normal  breath sounds and air entry.  Abdominal:     General: Abdomen is flat. Bowel sounds are normal.  Musculoskeletal:        General: Normal range of motion.     Cervical back: Normal range of motion and neck supple.     Right lower leg: No edema.     Left lower leg: No edema.  Skin:    General: Skin is warm and dry.  Neurological:     Mental Status: She is alert and oriented to person, place, and time. Mental status is at baseline.     Gait: Gait is intact.  Psychiatric:        Mood and Affect: Mood and affect normal.        Speech: Speech normal.        Behavior: Behavior normal.        Judgment: Judgment normal.      Results for orders placed or performed in visit on 12/31/21  POC  HgB A1c  Result Value Ref Range   Hemoglobin A1C 6.1 (A) 4.0 - 5.6 %   HbA1c POC (<> result, manual entry)     HbA1c, POC (prediabetic range)     HbA1c, POC (controlled diabetic range)      Last lipids Lab Results  Component Value Date   CHOL 121 08/12/2021   HDL 29.30 (L) 08/12/2021   LDLCALC 46 12/21/2019   LDLDIRECT 55.0 08/12/2021   TRIG 350.0 (H) 08/12/2021   CHOLHDL 4 08/12/2021      The ASCVD Risk score (Arnett DK, et al., 2019) failed to calculate for the following reasons:   The valid total cholesterol range is 130 to 320 mg/dL    Assessment & Plan:   Problem List Items Addressed This Visit       Cardiovascular and Mediastinum   HTN (hypertension) (Chronic)    Current hypertension medications:       Sig   metoprolol tartrate (LOPRESSOR) 25 MG tablet (Taking) TAKE ONE TABLET BY MOUTH TWICE DAILY   valsartan-hydrochlorothiazide (DIOVAN-HCT) 160-25 MG tablet (Taking) Take 1 tablet by mouth daily.  BP is elevated today in the visit. She reports no other associated symptoms. I reviewed the last few BP's in the computer, I recommended we add a small dose of amlodipine 2.5 mg once daily to her regimen to achieve good control and she is agreeable. She will continue to monitor her BP at home. RTC in 3 months for BP recheck      Relevant Medications   amLODipine (NORVASC) 2.5 MG tablet     Endocrine   Type 2 diabetes mellitus with diabetic neuropathy, without long-term current use of insulin (HCC) - Primary    A1C well controlled on the current regimen. Will continue this as prescribed. Patient was given education on reducing sugar and starches in her diet. Will check every 6 months. I reviewed her Lipid panel from May 2023 and her LDL is at the goal of <100. She will continue crestor 20 mg daily for CVD risk factor reduction.      Relevant Medications   Semaglutide, 2 MG/DOSE, (OZEMPIC, 2 MG/DOSE,) 8 MG/3ML SOPN   glimepiride (AMARYL) 2 MG tablet   metFORMIN  (GLUCOPHAGE) 1000 MG tablet   Other Relevant Orders   POC HgB A1c (Completed)   Other Visit Diagnoses     Immunization due       Relevant Orders   Flu Vaccine QUAD High Dose(Fluad) (Completed)       Return  in about 3 months (around 04/01/2022) for BP recheck.    Farrel Conners, MD

## 2022-01-03 ENCOUNTER — Telehealth (INDEPENDENT_AMBULATORY_CARE_PROVIDER_SITE_OTHER): Payer: Medicare Other | Admitting: Family Medicine

## 2022-01-03 VITALS — Ht 63.0 in | Wt 147.0 lb

## 2022-01-03 DIAGNOSIS — U071 COVID-19: Secondary | ICD-10-CM

## 2022-01-03 MED ORDER — NIRMATRELVIR/RITONAVIR (PAXLOVID)TABLET: 3.0000 | ORAL_TABLET | Freq: Two times a day (BID) | ORAL | 0 refills | Status: AC

## 2022-01-03 NOTE — Assessment & Plan Note (Signed)
Current hypertension medications:      Sig   metoprolol tartrate (LOPRESSOR) 25 MG tablet (Taking) TAKE ONE TABLET BY MOUTH TWICE DAILY   valsartan-hydrochlorothiazide (DIOVAN-HCT) 160-25 MG tablet (Taking) Take 1 tablet by mouth daily.     BP is elevated today in the visit. She reports no other associated symptoms. I reviewed the last few BP's in the computer, I recommended we add a small dose of amlodipine 2.5 mg once daily to her regimen to achieve good control and she is agreeable. She will continue to monitor her BP at home. RTC in 3 months for BP recheck

## 2022-01-03 NOTE — Progress Notes (Signed)
Established Patient Office Visit  Subjective   Patient ID: Andrea [name], female    DOB: 06/26/1955  Age: 66 y.o. MRN: [id number]  Chief Complaint  Patient presents with   Covid Positive    Pt reports she got flu shot on Tuesday. Sx started on same day - cold, chills, fatigue, loss of appetite, diarrhea, cough.Tested positive yesterday home test.    I connected with  Madisan Bice Toft on 01/03/22 by an audio enabled telemedicine application and verified that I am speaking with the correct person using two identifiers.   I discussed the limitations of evaluation and management by telemedicine. The patient expressed understanding and agreed to proceed.   Patient location: Home  Provider location: [place].  Patient reports that after her Tuesday visit she started having coughing, nausea and vomiting, subjective fever/chills. No chest pain but she felt very tired and weak. Some dizziness. States she took the COVID at home and it was positive yesterday.    Current Outpatient Medications  Medication Instructions   amLODipine (NORVASC) 2.5 mg, Oral, Daily   aspirin 81 mg, Oral, Daily   cetirizine (ZYRTEC) 10 mg, Oral, Daily   folic acid (FOLVITE) 1 MG tablet TAKE ONE TABLET BY MOUTH EVERY MORNING   glimepiride (AMARYL) 2 MG tablet TAKE ONE TABLET BY MOUTH BEFORE BREAKFAST   glucose blood (ONETOUCH ULTRA) test strip USE TO CHECH BLOOD SUGAR DAILY AND AS NEEDED   Insulin Pen Needle 32G X 4 MM MISC Use as directed   metFORMIN (GLUCOPHAGE) 1000 MG tablet TAKE ONE TABLET BY MOUTH TWICE DAILY   metoprolol tartrate (LOPRESSOR) 25 MG tablet TAKE ONE TABLET BY MOUTH TWICE DAILY   nirmatrelvir/ritonavir EUA (PAXLOVID) 20 x 150 MG & 10 x '100MG'$  TABS 3 tablets, Oral, 2 times daily, (Take nirmatrelvir 150 mg two tablets twice daily for 5 days and ritonavir 100 mg one tablet twice daily for 5 days) Patient GFR is 60.   ONETOUCH DELICA LANCETS FINE MISC 1 Stick, Does not apply, Daily   Ozempic  (2 MG/DOSE) 2 mg, Subcutaneous, Weekly   pantoprazole (PROTONIX) 40 mg, Oral, Daily   rosuvastatin (CRESTOR) 20 mg, Oral, Every evening   valsartan-hydrochlorothiazide (DIOVAN-HCT) 160-25 MG tablet 1 tablet, Oral, Daily   vitamin B-12 (CYANOCOBALAMIN) 500 mcg, Oral, Daily    Patient Active Problem List   Diagnosis Date Noted   Hyperlipidemia associated with type 2 diabetes mellitus ([place]) 09/09/2018   B12 deficiency 09/09/2018   Pseudophakia 05/15/2016   Type 2 diabetes mellitus with diabetic neuropathy, without long-term current use of insulin ([place]) 05/24/2015   Hx of adenomatous colonic polyps 08/09/2014   Osteoarthritis 05/07/2007   HTN (hypertension) 11/19/2006   Coronary atherosclerosis 11/09/2006      Review of Systems  All other systems reviewed and are negative.     Objective:     Ht '5\' 3"'$  (1.6 m)   Wt 147 lb (66.7 kg)   BMI 26.04 kg/m    Physical Exam Pulmonary:     Effort: Pulmonary effort is normal.  Neurological:     Mental Status: She is alert and oriented to person, place, and time. Mental status is at baseline.  Psychiatric:        Mood and Affect: Mood normal.        Behavior: Behavior normal.      No results found for any visits on 01/03/22.    The ASCVD Risk score (Arnett DK, et al., 2019) failed to calculate for the following  reasons:   The valid total cholesterol range is 130 to 320 mg/dL    Assessment & Plan:   Problem List Items Addressed This Visit   None Visit Diagnoses     COVID-19    -  Primary   Relevant Medications   nirmatrelvir/ritonavir EUA (PAXLOVID) 20 x 150 MG & 10 x '100MG'$  TABS     Age >74 with comorbid conditions. She is still in the 5 day window to receive Paxlovid. We discussed the risks vs. Benefits of taking the medication and she is agreeable to try it. I advised isolation until day 7 of her illness and then she should wear a mask for the next 7 days after.   No follow-ups on file.    Farrel Conners, MD

## 2022-01-03 NOTE — Assessment & Plan Note (Addendum)
A1C well controlled on the current regimen. Will continue this as prescribed. Patient was given education on reducing sugar and starches in her diet. Will check every 6 months. I reviewed her Lipid panel from May 2023 and her LDL is at the goal of <100. She will continue crestor 20 mg daily for CVD risk factor reduction.

## 2022-01-08 ENCOUNTER — Ambulatory Visit: Payer: Medicare Other | Admitting: Podiatry

## 2022-01-10 ENCOUNTER — Telehealth: Payer: Self-pay | Admitting: Pharmacist

## 2022-01-10 NOTE — Chronic Care Management (AMB) (Signed)
    Chronic Care Management Pharmacy Assistant   Name: MYLEI BRACKEEN  MRN: [id number] DOB: [birth year]  01/13/2022 APPOINTMENT REMINDER  Sherian Rein Hinnenkamp was reminded to have all medications, supplements and any blood glucose and blood pressure readings available for review with [name], Pharm. D, at her telephone visit on 01/13/2022 at 1:00.  Care Gaps: AWV - scheduled 01/28/2022 Last BP - 140/62 on 12/31/2021 Last A1C - 6.1 on 12/31/2021 Urine ACR - due soon Covid - postponed  Star Rating Drug: Glimepiride 2 mg - last filled 12/18/2021 30 DS at [facility] Metformin 1000 mg - last filled 12/18/2021 30 DS at [facility] Rosuvastatin 20 mg - last filled 12/18/2021 30 DS at [facility]  Ozempic 2 mg - last filled 12/18/2021 28 DS at [facility]  Valsartan HCTZ 160/25 mg - last filled 12/18/2021 30 DS at [facility]  Any gaps in medications fill history? No  [facility]  Catering manager [phone]

## 2022-01-13 ENCOUNTER — Other Ambulatory Visit: Payer: Self-pay | Admitting: *Deleted

## 2022-01-13 ENCOUNTER — Ambulatory Visit: Payer: Medicare Other | Admitting: Pharmacist

## 2022-01-13 DIAGNOSIS — I10 Essential (primary) hypertension: Secondary | ICD-10-CM

## 2022-01-13 DIAGNOSIS — E1140 Type 2 diabetes mellitus with diabetic neuropathy, unspecified: Secondary | ICD-10-CM

## 2022-01-13 MED ORDER — METOPROLOL TARTRATE 25 MG PO TABS: 25.0000 mg | ORAL_TABLET | Freq: Two times a day (BID) | ORAL | 1 refills | Status: DC

## 2022-01-13 NOTE — Progress Notes (Cosign Needed)
Chronic Care Management Pharmacy Note  01/13/2022 Name:  Andrea [name] MRN:  [id number] DOB:  1955/09/01  Summary: A1c at goal < 7%  BP not at goal < 130/80 per office readings and pt has not checked since   Recommendations/Changes made from today's visit: -Recommend continuing with current Ozempic dose (57 clicks) -Recommend repeat A1c and if < 7% consider discontinuing glimepiride -Recommended restarting BP monitoring regularly  Plan: BP assessment in 1 month Follow up after PCP visit  Subjective: Andrea [name] is an 66 y.o. year old female who is a primary patient of Legrand Como, Royston Cowper, MD.  The CCM team was consulted for assistance with disease management and care coordination needs.    Engaged with patient by telephone for follow up visit in response to provider referral for pharmacy case management and/or care coordination services.   Consent to Services:  The patient was given information about Chronic Care Management services, agreed to services, and gave verbal consent prior to initiation of services.  Please see initial visit note for detailed documentation.   Patient Care Team: Farrel Conners, MD as PCP - General (Family Medicine) Rutherford Guys, MD as Consulting Physician (Ophthalmology) [name], [facility] as Pharmacist (Pharmacist)  Recent office visits: 01/03/22 [name], MD: Patient presented for video visit for COVID 19 infection. Prescribed Paxlovid.  12/31/21 [name], MD: Patient presented for [facility] visit. A1c decreased to 6.1%. Prescribed amlodipine 2.5 mg daily and follow up in 3 months.   08/12/2021 [name] MD - Patient was seen for Type 2 diabetes mellitus with diabetic neuropathy, without long-term current use of insulin and additional issues. Rosuvastatin increased to 20 mg for triglyceride lowering.  Recent consult visits: 09/04/21 Gardiner Barefoot, DPM ([facility]): Patient presented for nail trim.  06/20/21 Rutherford Guys, MD ([facility]): Patient presented for diabetic eye exam.   06/05/21 Gardiner Barefoot, DPM ([facility]): Patient presented for nail trim.  Hospital visits: None in previous 6 months  Objective:  Lab Results  Component Value Date   CREATININE 0.96 08/12/2021   BUN 15 08/12/2021   GFR 60.96 08/12/2021   GFRNONAA >60 05/12/2018   GFRAA >60 05/12/2018   NA 136 08/12/2021   K 4.6 08/12/2021   CALCIUM 9.7 08/12/2021   CO2 27 08/12/2021   GLUCOSE 127 (H) 08/12/2021    Lab Results  Component Value Date/Time   HGBA1C 6.1 (A) 12/31/2021 09:38 AM   HGBA1C 7.2 (H) 08/12/2021 10:26 AM   HGBA1C 7.0 (A) 05/20/2021 09:02 AM   HGBA1C 7.0 (H) 02/08/2021 11:38 AM   GFR 60.96 08/12/2021 10:26 AM   GFR 65.11 05/20/2021 09:48 AM   MICROALBUR 2.2 (H) 02/08/2021 11:38 AM   MICROALBUR 1.5 12/21/2019 08:54 AM    Last diabetic Eye exam:  Lab Results  Component Value Date/Time   HMDIABEYEEXA No Retinopathy 06/20/2021 12:00 AM    Last diabetic Foot exam:  Lab Results  Component Value Date/Time   HMDIABFOOTEX done 07/01/2009 12:00 AM     Lab Results  Component Value Date   CHOL 121 08/12/2021   HDL 29.30 (L) 08/12/2021   LDLCALC 46 12/21/2019   LDLDIRECT 55.0 08/12/2021   TRIG 350.0 (H) 08/12/2021   CHOLHDL 4 08/12/2021       Latest Ref Rng & Units 08/12/2021   10:26 AM 05/20/2021    9:48 AM 02/08/2021   11:38 AM  Hepatic Function  Total Protein 6.0 - 8.3 g/dL 7.6  7.2  7.4   Albumin 3.5 - 5.2 g/dL 4.3  4.1  4.3   AST 0 - 37 U/L _0 ALT 0 - 35 U/L _1 Alk Phosphatase 39 - 117 U/L 55  52  47   Total Bilirubin 0.2 - 1.2 mg/dL 0.4  0.3  0.4     Lab Results  Component Value Date/Time   TSH 3.35 02/08/2021 11:38 AM   TSH 3.29 12/21/2019 08:54 AM       Latest Ref Rng & Units 05/20/2021    9:48 AM 02/08/2021   11:38 AM 12/21/2019    8:54 AM  CBC  WBC 4.0 - 10.5 K/uL 9.7  6.2  7.4   Hemoglobin 12.0 - 15.0 g/dL 12.2  12.6  12.6   Hematocrit  36.0 - 46.0 % 35.6  37.0  37.2   Platelets 150.0 - 400.0 K/uL 239.0  265.0  259     Lab Results  Component Value Date/Time   VD25OH 35 12/21/2019 08:54 AM    Clinical ASCVD: Yes  The ASCVD Risk score (Arnett DK, et al., 2019) failed to calculate for the following reasons:   The valid total cholesterol range is 130 to 320 mg/dL       12/31/2021   10:03 AM 08/12/2021    9:24 AM 05/20/2021    9:50 AM  Depression screen PHQ 2/9  Decreased Interest 0 0 2  Down, Depressed, Hopeless 0 0 0  PHQ - 2 Score 0 0 2  Altered sleeping 2 0 2  Tired, decreased energy 0 0 2  Change in appetite 2 0 2  Feeling bad or failure about yourself  0 0 2  Trouble concentrating 0 0 0  Moving slowly or fidgety/restless 0 0 0  Suicidal thoughts 0 0 0  PHQ-9 Score 4 0 10  Difficult doing work/chores Not difficult at all       Social History   Tobacco Use  Smoking Status Former   Types: E-cigarettes   Quit date: 06/20/2010   Years since quitting: 11.5  Smokeless Tobacco Never  Tobacco Comments   smoked 1ppd for> 30 years/smokes occasionally,some vapor cigarettes   BP Readings from Last 3 Encounters:  12/31/21 (!) 140/62  08/12/21 (!) 142/60  05/20/21 130/60   Pulse Readings from Last 3 Encounters:  12/31/21 79  08/12/21 70  05/20/21 77   Wt Readings from Last 3 Encounters:  01/03/22 147 lb (66.7 kg)  12/31/21 147 lb 3.2 oz (66.8 kg)  08/12/21 151 lb 9.6 oz (68.8 kg)   BMI Readings from Last 3 Encounters:  01/03/22 26.04 kg/m  12/31/21 26.08 kg/m  08/12/21 26.85 kg/m    Assessment/Interventions: Review of patient past medical history, allergies, medications, health status, including review of consultants reports, laboratory and other test data, was performed as part of comprehensive evaluation and provision of chronic care management services.   SDOH:  (Social Determinants of Health) assessments and interventions performed: Yes  (last 11/08/21) SDOH Interventions    Flowsheet Row  Chronic Care Management from 11/08/2021 in [place] at [place] Management from 07/18/2019 in [place] at [place] Strain Interventions Intervention Not Indicated Other (Comment)  [Patient endorsed Celesta Gentile is cost prohibitive. Currently Januvia is on hold. Will reassess at follow up. Advised patient to call me for if additional help is needed.]      SDOH Screenings  Food Insecurity: No Food Insecurity (01/15/2021)  Housing: Low Risk  (01/15/2021)  Transportation Needs: No Transportation Needs (01/15/2021)  Depression (PHQ2-9): Low Risk  (12/31/2021)  Financial Resource Strain: Low Risk  (11/08/2021)  Physical Activity: Insufficiently Active (01/15/2021)  Social Connections: Socially Isolated (01/15/2021)  Stress: No Stress Concern Present (01/15/2021)  Tobacco Use: Medium Risk (12/31/2021)    CCM Care Plan  Allergies  Allergen Reactions   Plavix [Clopidogrel Bisulfate]     Caused platelets to drop    Medications Reviewed Today     Reviewed by Encarnacion Slates, [place] (Certified Medical Assistant) on 01/03/22 at [phone]  Med List Status: <None>   Medication Order Taking? Sig Documenting Provider Last Dose Status Informant  amLODipine (NORVASC) 2.5 MG tablet [id number] Yes Take 1 tablet (2.5 mg total) by mouth daily. Farrel Conners, MD Taking Active   aspirin 81 MG tablet [id number] Yes Take 81 mg by mouth daily.   [provider] Taking Active Self  cetirizine (ZYRTEC) 10 MG tablet [id number] Yes Take 10 mg by mouth daily. [provider] Taking Active Self  folic acid (FOLVITE) 1 MG tablet [id number] Yes TAKE ONE TABLET BY MOUTH EVERY MORNING Koberlein, Steele Berg, MD Taking Active   glimepiride (AMARYL) 2 MG tablet [id number] Yes TAKE ONE TABLET BY MOUTH BEFORE Liliane Shi, MD Taking Active   glucose blood [facility] ULTRA) test strip [id number] Yes USE TO [facility] BLOOD SUGAR DAILY AND AS NEEDED  Caren Macadam, MD Taking Active   Insulin Pen Needle 32G X 4 MM MISC [id number] Yes Use as directed Farrel Conners, MD Taking Active   metFORMIN (GLUCOPHAGE) 1000 MG tablet [id number] Yes TAKE ONE TABLET BY MOUTH TWICE DAILY Farrel Conners, MD Taking Active   metoprolol tartrate (LOPRESSOR) 25 MG tablet [id number] Yes TAKE ONE TABLET BY MOUTH TWICE DAILY Caren Macadam, MD Taking Active   [facility] LANCETS FINE [place] [id number] Yes 1 Stick by Does not apply route daily. Ricard Dillon, MD Taking Active Self  pantoprazole (PROTONIX) 40 MG tablet [id number] Yes Take 1 tablet (40 mg total) by mouth daily. Caren Macadam, MD Taking Active   rosuvastatin (CRESTOR) 20 MG tablet [id number] Yes Take 1 tablet (20 mg total) by mouth every evening. Caren Macadam, MD Taking Active   Semaglutide, 2 MG/DOSE, (OZEMPIC, 2 MG/DOSE,) 8 MG/3ML SOPN [id number] Yes Inject 2 mg into the skin once a week. Farrel Conners, MD Taking Active   valsartan-hydrochlorothiazide [facility]) 160-25 MG tablet [id number] Yes Take 1 tablet by mouth daily. Caren Macadam, MD Taking Active   vitamin B-12 (CYANOCOBALAMIN) 500 MCG tablet [id number] Yes Take 500 mcg by mouth daily.  [provider] Taking Active Self            Patient Active Problem List   Diagnosis Date Noted   Hyperlipidemia associated with type 2 diabetes mellitus ([place]) 09/09/2018   B12 deficiency 09/09/2018   Pseudophakia 05/15/2016   Type 2 diabetes mellitus with diabetic neuropathy, without long-term current use of insulin ([place]) 05/24/2015   Hx of adenomatous colonic polyps 08/09/2014   Osteoarthritis 05/07/2007   HTN (hypertension) 11/19/2006   Coronary atherosclerosis 11/09/2006    Immunization History  Administered Date(s) Administered   Fluad Quad(high Dose 65+) 12/21/2019, 02/06/2021, 12/31/2021   Influenza Whole 01/12/1997, 01/05/2009   Influenza,inj,Quad PF,6+ Mos 01/12/2015, 12/27/2015, 03/09/2017,  12/01/2018   Influenza-Unspecified 02/12/2014, 01/19/2018   PFIZER(Purple Top)SARS-COV-2 Vaccination 07/07/2019, 08/01/2019, 02/06/2021   Pneumococcal Conjugate-13 12/01/2018  Pneumococcal Polysaccharide-23 04/24/2008, 01/31/2014, 12/21/2019   Td 11/13/1997, 04/27/2009   Tdap 05/20/2020   Zoster Recombinat (Shingrix) 08/22/2017, 11/12/2017, 01/12/2018   Patient is feeling better since having COVID. She still doesn't feel all that hungry. She just had a bad cough and it was deep and hard to get up but is doing better with that. She feels like she sometimes has something stuck in her throat. She will let us know if her symptoms don't improve in a couple of weeks.  Patient just started on the amlodipine. She hasn't been checking her BP since starting back on this. Patient hasn't checked it since going to the doctor.   Conditions to be addressed/monitored:  Hypertension, Hyperlipidemia, Diabetes and vitamin B12 deficiency  Conditions addressed this visit: Diabetes, hypertension  Care Plan : CCM Pharmacy Care Plan  Updates made by [name], [place] since 01/13/2022 12:00 AM     Problem: Problem: Hypertension, Hyperlipidemia, Diabetes and vitamin B12 deficiency      Long-Range Goal: Patient-Specific Goal   Start Date: 07/17/2020  Expected End Date: 07/17/2021  Recent Progress: On track  Priority: High  Note:   Current Barriers:  Unable to independently monitor therapeutic efficacy Unable to maintain control of  diabetes and cholesterol  Pharmacist Clinical Goal(s):  Patient will achieve adherence to monitoring guidelines and medication adherence to achieve therapeutic efficacy through collaboration with PharmD and provider.   Interventions: 1:1 collaboration with Farrel Conners, MD regarding development and update of comprehensive plan of care as evidenced by provider attestation and co-signature Inter-disciplinary care team collaboration (see longitudinal plan of  care) Comprehensive medication review performed; medication list updated in electronic medical record  Hypertension (BP goal <130/80) -Controlled -Current treatment: Metoprolol tartrate (Lopressor) 47m, 0.5 (one-half) tablet twice daily - Appropriate, Effective, Safe, Accessible Valsartan/HCTZ 160/25 mg, 1 tablet once daily - Appropriate, Effective, Safe, Accessible Amlodipine 2.5 mg 1 tablet daily - Appropriate, Query effective, Safe, Accessible -Medications previously tried: BScientist, water quality([employer]formulary) -Current home readings: not checking regularly now (prev avg: 128/62) -Current dietary habits: only using salt when eating tomato sandwiches; does not use on any thing else -Current exercise habits: not walking consistently - was walking 15-20 minutes per day -Denies hypotensive/hypertensive symptoms -Educated on Exercise goal of 150 minutes per week; Importance of home blood pressure monitoring; -Counseled to monitor BP at home weekly, document, and provide log at future appointments -Counseled on diet and exercise extensively Recommended to continue current medication Recommended restarting BP monitoring at home.  Hyperlipidemia/coronary atherosclerosis: (LDL goal < 70, TG <150 ) -Controlled for LDL; TGs uncontrolled -Current treatment: Rosuvastatin (Crestor) 221m 1 tablet once daily - Appropriate, Query effective, Safe, Accessible ASA 8127m1 tablet once daily - Appropriate, Effective, Safe, Accessible -Medications previously tried: none  -Current dietary patterns: focusing on decreasing bread and soda -Current exercise habits: not consistently exercising -Educated on Cholesterol goals;  Exercise goal of 150 minutes per week; -Counseled on diet and exercise extensively Recommended to continue current medication  Diabetes (A1c goal <7%) -Controlled -Current medications: glimepiride (Amaryl) 2mg55m tablet once daily before breakfast - Appropriate, Effective, Safe,  Accessible Metformin 1000mg9mtablet twice daily - Appropriate, Effective, Safe, Accessible Ozempic 2 mg inject once weekly (around 1.75 mg) - Appropriate, Effective, Safe, Accessible -Medications previously tried: Onglyza (cost)   -Current home glucose readings: 7 days 138 (9 numbers), 14 days 151 (18 numbers), 151 (30 days) fasting glucose: 115 (afternoon before dinner), 131, 159 (before breakfast), 141, 87, 89 (afternoon), 162 (am),  133, 126, 137 (am) post prandial glucose: sometimes before and sometimes after  Lowest: 80 (before bedtime)  -Denies hypoglycemic/hyperglycemic symptoms -Current meal patterns:  breakfast: eggs, grits & bacon and toast lunch: did not discuss  dinner: [place] last night; cereal snacks: not many desserts, peanut butter crackers drinks: 1/2 can of soda and juice, not much water -Current exercise: not exercising consistently; was walking 3 times a week for about 20 minutes but is having more knee pain/popping -Educated on A1c and blood sugar goals; Exercise goal of 150 minutes per week; Benefits of routine self-monitoring of blood sugar; Carbohydrate counting and/or plate method -Counseled to check feet daily and get yearly eye exams -Counseled on diet and exercise extensively Recommended rechecking A1c and consider stopping glimepiride if < 6.5%  Vitamin B12 deficiency (Goal: vitamin B12 211-911) -Controlled -Current treatment  vitamin B12 525mg, 1 tablet once daily - Appropriate, Effective, Safe, Accessible -Medications previously tried: none  -Recommended to continue current medication  Health Maintenance -Vaccine gaps: tetanus, COVID booster -Current therapy:  Cetirizine 10 mg 1 tablet daily -Educated on Cost vs benefit of each product must be carefully weighed by individual consumer -Patient is satisfied with current therapy and denies issues -Recommended to continue current medication  Patient Goals/Self-Care Activities Patient  will:  - take medications as prescribed check glucose daily, document, and provide at future appointments check blood pressure weekly, document, and provide at future appointments  Follow Up Plan: Telephone follow up appointment with care management team member scheduled for: 3 months        Medication Assistance: None required.  Patient affirms current coverage meets needs.  Compliance/Adherence/Medication fill history: Care Gaps: COVID booster, urine ACR Last BP - 140/62 on 12/31/2021 Last A1C - 6.1 on 12/31/2021    Star-Rating Drugs: Glimepiride 2 mg - last filled 12/18/2021 30 DS at [facility] Metformin 1000 mg - last filled 12/18/2021 30 [facility] at [facility] Rosuvastatin 20 mg - last filled 12/18/2021 30 [facility] at [facility]  Ozempic 2 mg - last filled 12/18/2021 28 [facility] at [facility]  Valsartan HCTZ 160/25 mg - last filled 12/18/2021 30 [facility] at [facility]  Patient's preferred pharmacy is:  [facility] [place] [address] [place]Phone: [phone]: [phone] [facility] [address]Phone: [phone]: [phone]  Uses pill box? Yes - restocks on Wednesdays Pt endorses 100% compliance (sometime she might forget)  We discussed: Benefits of medication synchronization, packaging and delivery as well as enhanced pharmacist oversight with [facility]. Patient decided to: Utilize [facility] pharmacy for medication synchronization, packaging and delivery  Care Plan and Follow Up Patient Decision:  Patient agrees to Care Plan and Follow-up.  Plan: Telephone follow up appointment with care management team member scheduled for:  3 months  MJeni Salles PharmD BKing'S Daughters Medical CenterClinical Pharmacist [place] Cityat [phone] Patient is due for next adherence delivery on:  01/23/2022   Called patient and reviewed medications and coordinated delivery.   This delivery to include: Metformin 1000 mg- 1 tablet at breakfast and dinner Rosuvastatin 273m- take 1 tablet daily with dinner Glimepiride 2 mg One tablet before breakfast Valsartan-HCTZ 160/25 mg 1 tablet at breakfast Folic acid 1 mg - 1 tablet at breakfast Metoprolol 25 mg One tablet with breakfast and dinner Pantoprazole 40 mg  once tablet at bedtime   Patient will need a short fill: No short fill needed   Coordinated acute fill: No acute fill needed   Patient declined the following medications:   Pen Needles 32 gauge (not due til 02/27/2022) Ozempic - has at least 1 month's supply on hand due to not injecting quite 2 mg One touch ultra test strips (plenty on hand) One touch lancets (plenty on hand)   Confirmed delivery date of 01/23/2022 advised patient that pharmacy will contact them the morning of delivery.  Encounter details: CCM Time Spent       Value Time User   Total time (minutes)  0 01/13/2022  1:19 PM [name], [place]       I have personally reviewed this encounter including the documentation in this note and have collaborated with the care management provider regarding care management and care coordination activities to include development and update of the comprehensive care plan. I am certifying that I agree with the content of this note and encounter as supervising physician/advanced practice provider.

## 2022-01-13 NOTE — Patient Instructions (Signed)
Hi Andrea [name],  I am glad you are feeling a lot better! Don't forget to get back to checking your blood pressure at home regularly.  Please reach out to me if you have any questions or need anything before our follow up!  Best, Maddie  [name], PharmD, [place] Pharmacist [place] [facility] [place]

## 2022-01-28 ENCOUNTER — Ambulatory Visit (INDEPENDENT_AMBULATORY_CARE_PROVIDER_SITE_OTHER): Payer: Medicare Other

## 2022-01-28 VITALS — BP 126/60 | HR 78 | Temp 98.5°F | Ht 63.5 in | Wt 145.7 lb

## 2022-01-28 DIAGNOSIS — Z0000 Encounter for general adult medical examination without abnormal findings: Secondary | ICD-10-CM

## 2022-01-28 NOTE — Patient Instructions (Signed)
Andrea [name] , Thank you for taking time to come for your Medicare Wellness Visit. I appreciate your ongoing commitment to your health goals. Please review the following plan we discussed and let me know if I can assist you in the future.   Screening recommendations/referrals: Colonoscopy: completed 04/19/2018, due 04/20/2023 Mammogram: completed 06/20/2020, due 06/21/2022 Bone Density: completed 06/20/2020 Recommended yearly ophthalmology/optometry visit for glaucoma screening and checkup Recommended yearly dental visit for hygiene and checkup  Vaccinations: Influenza vaccine: completed 12/31/2021 Pneumococcal vaccine: completed 12/21/2019 Tdap vaccine: completed 05/20/2020, due 05/20/3030 Shingles vaccine: completed   Covid-19: 02/06/2021, 08/01/2019, 07/07/2019  Advanced directives: Advance directive discussed with you today. Even though you declined this today please call our office should you change your mind and we can give you the proper paperwork for you to fill out.  Conditions/risks identified: none  Next appointment: Follow up in one year for your annual wellness visit    Preventive Care 65 Years and Older, Female Preventive care refers to lifestyle choices and visits with your health care provider that can promote health and wellness. What does preventive care include? A yearly physical exam. This is also called an annual well check. Dental exams once or twice a year. Routine eye exams. Ask your health care provider how often you should have your eyes checked. Personal lifestyle choices, including: Daily care of your teeth and gums. Regular physical activity. Eating a healthy diet. Avoiding tobacco and drug use. Limiting alcohol use. Practicing safe sex. Taking low-dose aspirin every day. Taking vitamin and mineral supplements as recommended by your health care provider. What happens during an annual well check? The services and screenings done by your health care provider during your  annual well check will depend on your age, overall health, lifestyle risk factors, and family history of disease. Counseling  Your health care provider may ask you questions about your: Alcohol use. Tobacco use. Drug use. Emotional well-being. Home and relationship well-being. Sexual activity. Eating habits. History of falls. Memory and ability to understand (cognition). Work and work Statistician. Reproductive health. Screening  You may have the following tests or measurements: Height, weight, and BMI. Blood pressure. Lipid and cholesterol levels. These may be checked every 5 years, or more frequently if you are over 85 years old. Skin check. Lung cancer screening. You may have this screening every year starting at age 6 if you have a 30-pack-year history of smoking and currently smoke or have quit within the past 15 years. Fecal occult blood test (FOBT) of the stool. You may have this test every year starting at age 31. Flexible sigmoidoscopy or colonoscopy. You may have a sigmoidoscopy every 5 years or a colonoscopy every 10 years starting at age 69. Hepatitis C blood test. Hepatitis B blood test. Sexually transmitted disease (STD) testing. Diabetes screening. This is done by checking your blood sugar (glucose) after you have not eaten for a while (fasting). You may have this done every 1-3 years. Bone density scan. This is done to screen for osteoporosis. You may have this done starting at age 24. Mammogram. This may be done every 1-2 years. Talk to your health care provider about how often you should have regular mammograms. Talk with your health care provider about your test results, treatment options, and if necessary, the need for more tests. Vaccines  Your health care provider may recommend certain vaccines, such as: Influenza vaccine. This is recommended every year. Tetanus, diphtheria, and acellular pertussis (Tdap, Td) vaccine. You may need a Td  booster every 10  years. Zoster vaccine. You may need this after age 38. Pneumococcal 13-valent conjugate (PCV13) vaccine. One dose is recommended after age 41. Pneumococcal polysaccharide (PPSV23) vaccine. One dose is recommended after age 38. Talk to your health care provider about which screenings and vaccines you need and how often you need them. This information is not intended to replace advice given to you by your health care provider. Make sure you discuss any questions you have with your health care provider. Document Released: 04/27/2015 Document Revised: 12/19/2015 Document Reviewed: 01/30/2015 Elsevier Interactive Patient Education  2017 [place] Prevention in the Home Falls can cause injuries. They can happen to people of all ages. There are many things you can do to make your home safe and to help prevent falls. What can I do on the outside of my home? Regularly fix the edges of walkways and driveways and fix any cracks. Remove anything that might make you trip as you walk through a door, such as a raised step or threshold. Trim any bushes or trees on the path to your home. Use bright outdoor lighting. Clear any walking paths of anything that might make someone trip, such as rocks or tools. Regularly check to see if handrails are loose or broken. Make sure that both sides of any steps have handrails. Any raised decks and porches should have guardrails on the edges. Have any leaves, snow, or ice cleared regularly. Use sand or salt on walking paths during winter. Clean up any spills in your garage right away. This includes oil or grease spills. What can I do in the bathroom? Use night lights. Install grab bars by the toilet and in the tub and shower. Do not use towel bars as grab bars. Use non-skid mats or decals in the tub or shower. If you need to sit down in the shower, use a plastic, non-slip stool. Keep the floor dry. Clean up any water that spills on the floor as soon as it  happens. Remove soap buildup in the tub or shower regularly. Attach bath mats securely with double-sided non-slip rug tape. Do not have throw rugs and other things on the floor that can make you trip. What can I do in the bedroom? Use night lights. Make sure that you have a light by your bed that is easy to reach. Do not use any sheets or blankets that are too big for your bed. They should not hang down onto the floor. Have a firm chair that has side arms. You can use this for support while you get dressed. Do not have throw rugs and other things on the floor that can make you trip. What can I do in the kitchen? Clean up any spills right away. Avoid walking on wet floors. Keep items that you use a lot in easy-to-reach places. If you need to reach something above you, use a strong step stool that has a grab bar. Keep electrical cords out of the way. Do not use floor polish or wax that makes floors slippery. If you must use wax, use non-skid floor wax. Do not have throw rugs and other things on the floor that can make you trip. What can I do with my stairs? Do not leave any items on the stairs. Make sure that there are handrails on both sides of the stairs and use them. Fix handrails that are broken or loose. Make sure that handrails are as long as the stairways. Check any carpeting  to make sure that it is firmly attached to the stairs. Fix any carpet that is loose or worn. Avoid having throw rugs at the top or bottom of the stairs. If you do have throw rugs, attach them to the floor with carpet tape. Make sure that you have a light switch at the top of the stairs and the bottom of the stairs. If you do not have them, ask someone to add them for you. What else can I do to help prevent falls? Wear shoes that: Do not have high heels. Have rubber bottoms. Are comfortable and fit you well. Are closed at the toe. Do not wear sandals. If you use a stepladder: Make sure that it is fully opened.  Do not climb a closed stepladder. Make sure that both sides of the stepladder are locked into place. Ask someone to hold it for you, if possible. Clearly mark and make sure that you can see: Any grab bars or handrails. First and last steps. Where the edge of each step is. Use tools that help you move around (mobility aids) if they are needed. These include: Canes. Walkers. Scooters. Crutches. Turn on the lights when you go into a dark area. Replace any light bulbs as soon as they burn out. Set up your furniture so you have a clear path. Avoid moving your furniture around. If any of your floors are uneven, fix them. If there are any pets around you, be aware of where they are. Review your medicines with your doctor. Some medicines can make you feel dizzy. This can increase your chance of falling. Ask your doctor what other things that you can do to help prevent falls. This information is not intended to replace advice given to you by your health care provider. Make sure you discuss any questions you have with your health care provider. Document Released: 01/25/2009 Document Revised: 09/06/2015 Document Reviewed: 05/05/2014 Elsevier Interactive Patient Education  2017 [employer].

## 2022-01-28 NOTE — Progress Notes (Signed)
Subjective:   Andrea [name] is a 66 y.o. female who presents for Medicare Annual (Subsequent) preventive examination.  Review of Systems     Cardiac Risk Factors include: advanced age (>74mn, >>4women);diabetes mellitus;dyslipidemia;hypertension     Objective:    Today's Vitals   01/28/22 0921  BP: 126/60  Pulse: 78  Temp: 98.5 F (36.9 C)  TempSrc: Oral  SpO2: 94%  Weight: 145 lb 11.2 oz (66.1 kg)  Height: 5' 3.5" (1.613 m)   Body mass index is 25.4 kg/m.     01/28/2022    9:32 AM 01/15/2021    9:42 AM 01/10/2020    8:08 AM 08/01/2014    8:41 AM  Advanced Directives  Does Patient Have a Medical Advance Directive? No No No No  Would patient like information on creating a medical advance directive? No - Patient declined Yes (MAU/Ambulatory/Procedural Areas - Information given) Yes (MAU/Ambulatory/Procedural Areas - Information given)     Current Medications (verified) Outpatient Encounter Medications as of 01/28/2022  Medication Sig   amLODipine (NORVASC) 2.5 MG tablet Take 1 tablet (2.5 mg total) by mouth daily.   aspirin 81 MG tablet Take 81 mg by mouth daily.     cetirizine (ZYRTEC) 10 MG tablet Take 10 mg by mouth daily.   folic acid (FOLVITE) 1 MG tablet TAKE ONE TABLET BY MOUTH EVERY MORNING   glimepiride (AMARYL) 2 MG tablet TAKE ONE TABLET BY MOUTH BEFORE BREAKFAST   glucose blood (ONETOUCH ULTRA) test strip USE TO CHECH BLOOD SUGAR DAILY AND AS NEEDED   Insulin Pen Needle 32G X 4 MM MISC Use as directed   metFORMIN (GLUCOPHAGE) 1000 MG tablet TAKE ONE TABLET BY MOUTH TWICE DAILY   metoprolol tartrate (LOPRESSOR) 25 MG tablet Take 1 tablet (25 mg total) by mouth 2 (two) times daily.   ONETOUCH DELICA LANCETS FINE MISC 1 Stick by Does not apply route daily.   pantoprazole (PROTONIX) 40 MG tablet Take 1 tablet (40 mg total) by mouth daily.   rosuvastatin (CRESTOR) 20 MG tablet Take 1 tablet (20 mg total) by mouth every evening.   Semaglutide, 2 MG/DOSE,  (OZEMPIC, 2 MG/DOSE,) 8 MG/3ML SOPN Inject 2 mg into the skin once a week.   valsartan-hydrochlorothiazide (DIOVAN-HCT) 160-25 MG tablet Take 1 tablet by mouth daily.   vitamin B-12 (CYANOCOBALAMIN) 500 MCG tablet Take 500 mcg by mouth daily.    No facility-administered encounter medications on file as of 01/28/2022.    Allergies (verified) Plavix [clopidogrel bisulfate]   History: Past Medical History:  Diagnosis Date   Allergy    Anemia    Arthritis    B12 deficiency 09/09/2018   CAD (coronary artery disease)    has 3 stents   Cataract    removed both eyes    Diabetes mellitus    Hx of adenomatous colonic polyps 08/09/2014   Hyperlipidemia    Hypertension    Low back pain    Past Surgical History:  Procedure Laterality Date   BREAST LUMPECTOMY     carpal tunnel release both hands      CESAREAN SECTION     2 times   COLONOSCOPY     x 2- hx colon polyps-    EYE SURGERY     lens replaced/02/06/15 and 03/09/15   knee surgery for torn cartillige     right knee   LASIK  01/12/2014, 03/14/2014   POLYPECTOMY     rotator cuff surgery     right side  stents  2000   stents 2001  2001   3 stents   triger finger repair     both hands, right hand done twice   TUBAL LIGATION     Family History  Problem Relation Age of Onset   Heart disease Mother    Hypertension Mother    Heart disease Father    Heart disease Sister    Other Sister        tumor on diaphragm; oxygen depdt   CAD Brother        stenting   Pancreatic cancer Paternal Uncle    Other Cousin        sepsis   Colitis Neg Hx    Colon cancer Neg Hx    Esophageal cancer Neg Hx    Rectal cancer Neg Hx    Stomach cancer Neg Hx    Social History   Socioeconomic History   Marital status: Divorced    Spouse name: Not on file   Number of children: Not on file   Years of education: Not on file   Highest education level: Not on file  Occupational History   Occupation: Retired  Tobacco Use   Smoking status:  Former    Types: E-cigarettes    Quit date: 06/20/2010    Years since quitting: 11.6   Smokeless tobacco: Never   Tobacco comments:    smoked 1ppd for> 30 years/smokes occasionally,some vapor cigarettes  Vaping Use   Vaping Use: Former  Substance and Sexual Activity   Alcohol use: No    Alcohol/week: 0.0 standard drinks of alcohol   Drug use: No   Sexual activity: Not Currently  Other Topics Concern   Not on file  Social History Narrative   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      Home Situation: lives with son and grandson      Spiritual Beliefs: none      Lifestyle: no regular exercise; diet not great      Social Determinants of Health   Financial Resource Strain: Low Risk  (01/28/2022)   Overall Financial Resource Strain (CARDIA)    Difficulty of Paying Living Expenses: Not hard at all  Food Insecurity: No Food Insecurity (01/28/2022)   Hunger Vital Sign    Worried About Running Out of Food in the Last Year: Never true    [place] in the Last Year: Never true  Transportation Needs: No Transportation Needs (01/28/2022)   PRAPARE - Hydrologist (Medical): No    Lack of Transportation (Non-Medical): No  Physical Activity: Inactive (01/28/2022)   Exercise Vital Sign    Days of Exercise per Week: 0 days    Minutes of Exercise per Session: 0 min  Stress: No Stress Concern Present (01/28/2022)   [place]    Feeling of Stress : Not at all  Social Connections: Socially Isolated (01/15/2021)   Social Connection and Isolation Panel [NHANES]    Frequency of Communication with Friends and Family: Three times a week    Frequency of Social Gatherings with Friends and Family: More than three times a week    Attends Religious Services: Never    Marine scientist or Organizations: No    Attends Archivist Meetings: Never    Marital Status: Divorced    Tobacco  Counseling Counseling given: Not Answered Tobacco comments: smoked 1ppd for> 30 years/smokes occasionally,some vapor cigarettes  Clinical Intake:  Pre-visit preparation completed: Yes  Pain : No/denies pain     Nutritional Status: BMI 25 -29 Overweight Nutritional Risks: Nausea/ vomitting/ diarrhea (diarrhea resolved Wednesday) Diabetes: Yes  How often do you need to have someone help you when you read instructions, pamphlets, or other written materials from your doctor or pharmacy?: 1 - Never What is the last grade level you completed in school?: 12th grade  Diabetic? Yes Nutrition Risk Assessment:  Has the patient had any N/V/D within the last 2 months?  Yes  Does the patient have any non-healing wounds?  No  Has the patient had any unintentional weight loss or weight gain?  No   Diabetes:  Is the patient diabetic?  Yes  If diabetic, was a CBG obtained today?  No  Did the patient bring in their glucometer from home?  No  How often do you monitor your CBG's? daily.   Financial Strains and Diabetes Management:  Are you having any financial strains with the device, your supplies or your medication? No .  Does the patient want to be seen by Chronic Care Management for management of their diabetes?  No  Would the patient like to be referred to a Nutritionist or for Diabetic Management?  No   Diabetic Exams:  Diabetic Eye Exam: Completed 06/20/2021 Diabetic Foot Exam: Completed 09/04/2021   Interpreter Needed?: No  Information entered by :: NAllen LPN   Activities of Daily Living    01/28/2022    9:33 AM  In your present state of health, do you have any difficulty performing the following activities:  Hearing? 0  Vision? 0  Difficulty concentrating or making decisions? 0  Walking or climbing stairs? 1  Comment due to right knee  Dressing or bathing? 0  Doing errands, shopping? 0  Preparing Food and eating ? N  Using the Toilet? N  In the past six months,  have you accidently leaked urine? N  Do you have problems with loss of bowel control? Y  Comment when had covid  Managing your Medications? N  Managing your Finances? N  Housekeeping or managing your Housekeeping? N    Patient Care Team: Farrel Conners, MD as PCP - General (Family Medicine) Rutherford Guys, MD as Consulting Physician (Ophthalmology) [name], [facility] as Pharmacist (Pharmacist)  Indicate any recent Medical Services you may have received from other than [facility] providers in the past year (date may be approximate).     Assessment:   This is a routine wellness examination for Andrea [name].  Hearing/Vision screen Vision Screening - Comments:: Regular eye exams, Dr. Gershon Crane  Dietary issues and exercise activities discussed: Current Exercise Habits: The patient does not participate in regular exercise at present   Goals Addressed             This Visit's Progress    Patient Stated       01/28/2022, no goals       Depression Screen    01/28/2022    9:33 AM 12/31/2021   10:03 AM 08/12/2021    9:24 AM 05/20/2021    9:50 AM 01/15/2021    9:41 AM 01/10/2020    8:07 AM 12/21/2019    7:55 AM  PHQ 2/9 Scores  PHQ - 2 Score 0 0 0 2 0 0 0  PHQ- 9 Score  4 0 10       Fall Risk    01/28/2022    9:33 AM 12/31/2021   10:03 AM 01/15/2021  9:44 AM 01/10/2020    8:09 AM 12/21/2019    7:55 AM  Fall Risk   Falls in the past year? 0 0 0 0 0  Number falls in past yr: 0 0 0 0 0  Injury with Fall? 0 0 0 0 0  Risk for fall due to : Medication side effect No Fall Risks Impaired vision    Follow up Falls prevention discussed;Education provided;Falls evaluation completed Falls evaluation completed Falls prevention discussed Falls prevention discussed     FALL RISK PREVENTION PERTAINING TO THE HOME:  Any stairs in or around the home? Yes  If so, are there any without handrails? No  Home free of loose throw rugs in walkways, pet beds, electrical cords, etc? Yes  Adequate  lighting in your home to reduce risk of falls? Yes   ASSISTIVE DEVICES UTILIZED TO PREVENT FALLS:  Life alert? No  Use of a cane, walker or w/c? No  Grab bars in the bathroom? No  Shower chair or bench in shower? No  Elevated toilet seat or a handicapped toilet? No   TIMED UP AND GO:  Was the test performed? Yes .  Length of time to ambulate 10 feet: 5 sec.   Gait steady and fast without use of assistive device  Cognitive Function:        01/28/2022    9:35 AM 01/15/2021    9:47 AM 01/10/2020    8:13 AM  6CIT Screen  What Year? 0 points 0 points 0 points  What month? 0 points 0 points 0 points  What time? 0 points 0 points   Count back from 20 0 points 0 points 0 points  Months in reverse 0 points 0 points 0 points  Repeat phrase 0 points 0 points 6 points  Total Score 0 points 0 points     Immunizations Immunization History  Administered Date(s) Administered   Fluad Quad(high Dose 65+) 12/21/2019, 02/06/2021, 12/31/2021   Influenza Whole 01/12/1997, 01/05/2009   Influenza,inj,Quad PF,6+ Mos 01/12/2015, 12/27/2015, 03/09/2017, 12/01/2018   Influenza-Unspecified 02/12/2014, 01/19/2018   PFIZER(Purple Top)SARS-COV-2 Vaccination 07/07/2019, 08/01/2019, 02/06/2021   Pneumococcal Conjugate-13 12/01/2018   Pneumococcal Polysaccharide-23 04/24/2008, 01/31/2014, 12/21/2019   Td 11/13/1997, 04/27/2009   Tdap 05/20/2020   Zoster Recombinat (Shingrix) 08/22/2017, 11/12/2017, 01/12/2018    TDAP status: Up to date  Flu Vaccine status: Up to date  Pneumococcal vaccine status: Up to date  Covid-19 vaccine status: Completed vaccines  Qualifies for Shingles Vaccine? Yes   Zostavax completed Yes   Shingrix Completed?: Yes  Screening Tests Health Maintenance  Topic Date Due   COVID-19 Vaccine (4 - Pfizer series) 04/03/2021   Diabetic kidney evaluation - Urine ACR  02/08/2022   MAMMOGRAM  06/21/2022   OPHTHALMOLOGY EXAM  06/21/2022   HEMOGLOBIN A1C  07/01/2022    Diabetic kidney evaluation - GFR measurement  08/13/2022   FOOT EXAM  09/05/2022   COLONOSCOPY (Pts 45-56yr Insurance coverage will need to be confirmed)  04/30/2023   TETANUS/TDAP  05/20/2030   Pneumonia Vaccine 68 Years old  Completed   INFLUENZA VACCINE  Completed   DEXA SCAN  Completed   Hepatitis C Screening  Completed   Zoster Vaccines- Shingrix  Completed   HPV VACCINES  Aged Out    Health Maintenance  Health Maintenance Due  Topic Date Due   COVID-19 Vaccine (4 - Pfizer series) 04/03/2021   Diabetic kidney evaluation - Urine ACR  02/08/2022    Colorectal cancer screening: Type of screening: Colonoscopy.  Completed 04/29/2018. Repeat every 5 years  Mammogram status: Completed 06/20/2020. Repeat every 2 years  Bone Density status: Completed 06/20/2020.   Lung Cancer Screening: (Low Dose CT Chest recommended if Age 32-80 years, 30 pack-year currently smoking OR have quit w/in 15years.) does not qualify.   Lung Cancer Screening Referral: no  Additional Screening:  Hepatitis C Screening: does qualify; Completed 11/23/2014  Vision Screening: Recommended annual ophthalmology exams for early detection of glaucoma and other disorders of the eye. Is the patient up to date with their annual eye exam?  Yes  Who is the provider or what is the name of the office in which the patient attends annual eye exams? Dr. Gershon Crane If pt is not established with a provider, would they like to be referred to a provider to establish care? No .   Dental Screening: Recommended annual dental exams for proper oral hygiene  Community Resource Referral / Chronic Care Management: CRR required this visit?  No   CCM required this visit?  No      Plan:     I have personally reviewed and noted the following in the patient's chart:   Medical and social history Use of alcohol, tobacco or illicit drugs  Current medications and supplements including opioid prescriptions. Patient is not currently taking  opioid prescriptions. Functional ability and status Nutritional status Physical activity Advanced directives List of other physicians Hospitalizations, surgeries, and ER visits in previous 12 months Vitals Screenings to include cognitive, depression, and falls Referrals and appointments  In addition, I have reviewed and discussed with patient certain preventive protocols, quality metrics, and best practice recommendations. A written personalized care plan for preventive services as well as general preventive health recommendations were provided to patient.     Kellie Simmering, LPN   [id number]   Nurse Notes: none

## 2022-01-29 ENCOUNTER — Encounter: Payer: Self-pay | Admitting: Podiatry

## 2022-01-29 ENCOUNTER — Ambulatory Visit: Payer: Medicare Other | Admitting: Podiatry

## 2022-01-29 DIAGNOSIS — M79675 Pain in left toe(s): Secondary | ICD-10-CM

## 2022-01-29 DIAGNOSIS — B351 Tinea unguium: Secondary | ICD-10-CM

## 2022-01-29 DIAGNOSIS — E0843 Diabetes mellitus due to underlying condition with diabetic autonomic (poly)neuropathy: Secondary | ICD-10-CM

## 2022-01-29 DIAGNOSIS — M79674 Pain in right toe(s): Secondary | ICD-10-CM

## 2022-01-29 NOTE — Progress Notes (Signed)
This patient returns to my office for at risk foot care.  This patient requires this care by a professional since this patient will be at risk due to having diabetes.  This patient is unable to cut nails herself since the patient cannot reach her nails.These nails are painful walking and wearing shoes.  This patient presents for at risk foot care today.  General Appearance  Alert, conversant and in no acute stress.  Vascular  Dorsalis pedis and posterior tibial  pulses are palpable  bilaterally.  Capillary return is within normal limits  bilaterally. Temperature is within normal limits  bilaterally.  Neurologic  Senn-Weinstein monofilament wire test within normal limits  bilaterally. Muscle power within normal limits bilaterally.  Nails Thick disfigured discolored nails with subungual debris  from hallux to fifth toes bilaterally. No evidence of bacterial infection or drainage bilaterally.Pincer hallux right toenail.  Orthopedic  No limitations of motion  feet .  No crepitus or effusions noted.  No bony pathology or digital deformities noted.  Skin  normotropic skin with no porokeratosis noted bilaterally.  No signs of infections or ulcers noted.     Onychomycosis  Pain in right toes  Pain in left toes  Consent was obtained for treatment procedures.   Mechanical debridement of nails 1-5  bilaterally performed with a nail nipper.  Filed with dremel without incident.    Return office visit    4  months                 Told patient to return for periodic foot care and evaluation due to potential at risk complications.   Gardiner Barefoot DPM

## 2022-02-10 ENCOUNTER — Other Ambulatory Visit: Payer: Self-pay | Admitting: *Deleted

## 2022-02-11 ENCOUNTER — Telehealth: Payer: Self-pay | Admitting: Pharmacist

## 2022-02-11 MED ORDER — PANTOPRAZOLE SODIUM 40 MG PO TBEC: 40.0000 mg | DELAYED_RELEASE_TABLET | Freq: Every day | ORAL | 1 refills | Status: DC

## 2022-02-11 MED ORDER — FOLIC ACID 1 MG PO TABS: 1.0000 mg | ORAL_TABLET | Freq: Every morning | ORAL | 1 refills | Status: DC

## 2022-02-11 MED ORDER — ROSUVASTATIN CALCIUM 20 MG PO TABS: 20.0000 mg | ORAL_TABLET | Freq: Every evening | ORAL | 1 refills | Status: DC

## 2022-02-11 NOTE — Chronic Care Management (AMB) (Signed)
Chronic Care Management Pharmacy Assistant   Name: Andrea [name]  MRN: [id number] DOB: 1955/09/10  Reason for Encounter: Medication Review / Medication Coordination Call   Recent office visits:  01/28/2022 Glenna Durand LPN - Medicare annual wellness exam  Recent consult visits:  01/29/2022 Gardiner Barefoot DPM - Patient was seen for Pain due to onychomycosis of toenails of both feet and additional concerns.No medication changes. Follow up in 4 months.  Hospital visits:  None  Medications: Outpatient Encounter Medications as of 02/11/2022  Medication Sig   amLODipine (NORVASC) 2.5 MG tablet Take 1 tablet (2.5 mg total) by mouth daily.   aspirin 81 MG tablet Take 81 mg by mouth daily.     cetirizine (ZYRTEC) 10 MG tablet Take 10 mg by mouth daily.   folic acid (FOLVITE) 1 MG tablet Take 1 tablet (1 mg total) by mouth every morning.   glimepiride (AMARYL) 2 MG tablet TAKE ONE TABLET BY MOUTH BEFORE BREAKFAST   glucose blood (ONETOUCH ULTRA) test strip USE TO CHECH BLOOD SUGAR DAILY AND AS NEEDED   Insulin Pen Needle 32G X 4 MM MISC Use as directed   metFORMIN (GLUCOPHAGE) 1000 MG tablet TAKE ONE TABLET BY MOUTH TWICE DAILY   metoprolol tartrate (LOPRESSOR) 25 MG tablet Take 1 tablet (25 mg total) by mouth 2 (two) times daily.   ONETOUCH DELICA LANCETS FINE MISC 1 Stick by Does not apply route daily.   pantoprazole (PROTONIX) 40 MG tablet Take 1 tablet (40 mg total) by mouth daily.   rosuvastatin (CRESTOR) 20 MG tablet Take 1 tablet (20 mg total) by mouth every evening.   Semaglutide, 2 MG/DOSE, (OZEMPIC, 2 MG/DOSE,) 8 MG/3ML SOPN Inject 2 mg into the skin once a week.   valsartan-hydrochlorothiazide (DIOVAN-HCT) 160-25 MG tablet Take 1 tablet by mouth daily.   vitamin B-12 (CYANOCOBALAMIN) 500 MCG tablet Take 500 mcg by mouth daily.    No facility-administered encounter medications on file as of 02/11/2022.   Reviewed chart for medication changes ahead of medication coordination  call.  BP Readings from Last 3 Encounters:  01/28/22 126/60  12/31/21 (!) 140/62  08/12/21 (!) 142/60    Lab Results  Component Value Date   HGBA1C 6.1 (A) 12/31/2021     Patient obtains medications through Adherence Packaging  30 Days    Last adherence delivery included: Metformin 1000 mg- 1 tablet at breakfast and dinner Rosuvastatin '20mg'$  - take 1 tablet daily with dinner Glimepiride 2 mg One tablet before breakfast Valsartan-HCTZ 160/25 mg 1 tablet at breakfast Folic acid 1 mg - 1 tablet at breakfast Metoprolol 25 mg One tablet with breakfast and dinner Pantoprazole 40 mg once tablet at bedtime   Patient declined last month:  Pen Needles 32 gauge (not due til 02/27/2022) Ozempic - has at least 1 month's supply on hand due to not injecting quite 2 mg One touch ultra test strips (plenty on hand) One touch lancets (plenty on hand)   Patient is due for next adherence delivery on: 02/21/2022   Called patient and reviewed medications and coordinated delivery.   This delivery to include: Metformin 1000 mg- 1 tablet at breakfast and dinner Rosuvastatin '20mg'$  - take 1 tablet daily with dinner Glimepiride 2 mg One tablet before breakfast Valsartan-HCTZ 160/25 mg 1 tablet at breakfast Folic acid 1 mg - 1 tablet at breakfast Metoprolol 25 mg One tablet with breakfast and dinner Pantoprazole 40 mg once tablet at bedtime Ozempic 2 mg/dose - Inject 2 mg into the  skin once a week One touch lancets    Patient will need a short fill: No short fill needed   Coordinated acute fill: No acute fill needed   Patient declined the following medications:   Test strips - not due until 12/15/202 Pen needles 32 gauge- use once daily  Confirmed delivery date of 02/21/2022, advised patient that pharmacy will contact them the morning of delivery.   Care Gaps: AWV - scheduled 02/03/2023 Last BP - 140/62 on 12/31/2021  Last A1C - 6.1 on 12/31/2021 Covid - overdue Urine ACR - overdue  Star  Rating Drugs: Glimepiride 2 mg - last filled 01/20/2022 30 [facility] Metformin 1000 mg - last filled 01/20/2022 30 [facility] Rosuvastatin 20 mg - last filled 01/20/2022 30 [facility]  Ozempic 2 mg - last filled 12/18/2021 28 [facility]  Valsartan HCTZ 160/25 mg - last filled 01/20/2022 30 DS at [place] [phone]

## 2022-02-13 NOTE — Chronic Care Management (AMB) (Signed)
Spoke with patient, she has been checking her blood pressure more often. Patients most recent readings are  02/13/22 - 122/62 02/12/22 - 136/66 01/28/22 - 114/64 01/23/22 - 116/65 01/21/22 - 115/62

## 2022-03-10 ENCOUNTER — Other Ambulatory Visit: Payer: Self-pay | Admitting: *Deleted

## 2022-03-10 MED ORDER — VALSARTAN-HYDROCHLOROTHIAZIDE 160-25 MG PO TABS: 1.0000 | ORAL_TABLET | Freq: Every day | ORAL | 1 refills | Status: DC

## 2022-03-10 NOTE — Telephone Encounter (Signed)
Rx done. 

## 2022-03-13 ENCOUNTER — Telehealth: Payer: Self-pay | Admitting: Pharmacist

## 2022-03-13 NOTE — Chronic Care Management (AMB) (Signed)
Chronic Care Management Pharmacy Assistant   Name: Andrea [name]  MRN: [id number] DOB: 1955-08-14  Reason for Encounter: Medication Review / Medication Coordination Call   Recent office visits:  None  Recent consult visits:  None  Hospital visits:  None  Medications: Outpatient Encounter Medications as of 03/13/2022  Medication Sig   amLODipine (NORVASC) 2.5 MG tablet Take 1 tablet (2.5 mg total) by mouth daily.   aspirin 81 MG tablet Take 81 mg by mouth daily.     cetirizine (ZYRTEC) 10 MG tablet Take 10 mg by mouth daily.   folic acid (FOLVITE) 1 MG tablet Take 1 tablet (1 mg total) by mouth every morning.   glimepiride (AMARYL) 2 MG tablet TAKE ONE TABLET BY MOUTH BEFORE BREAKFAST   glucose blood (ONETOUCH ULTRA) test strip USE TO CHECH BLOOD SUGAR DAILY AND AS NEEDED   Insulin Pen Needle 32G X 4 MM MISC Use as directed   metFORMIN (GLUCOPHAGE) 1000 MG tablet TAKE ONE TABLET BY MOUTH TWICE DAILY   metoprolol tartrate (LOPRESSOR) 25 MG tablet Take 1 tablet (25 mg total) by mouth 2 (two) times daily.   ONETOUCH DELICA LANCETS FINE MISC 1 Stick by Does not apply route daily.   pantoprazole (PROTONIX) 40 MG tablet Take 1 tablet (40 mg total) by mouth daily.   rosuvastatin (CRESTOR) 20 MG tablet Take 1 tablet (20 mg total) by mouth every evening.   Semaglutide, 2 MG/DOSE, (OZEMPIC, 2 MG/DOSE,) 8 MG/3ML SOPN Inject 2 mg into the skin once a week.   valsartan-hydrochlorothiazide (DIOVAN-HCT) 160-25 MG tablet Take 1 tablet by mouth daily.   vitamin B-12 (CYANOCOBALAMIN) 500 MCG tablet Take 500 mcg by mouth daily.    No facility-administered encounter medications on file as of 03/13/2022.   Reviewed chart for medication changes ahead of medication coordination call.  No OVs, Consults, or hospital visits since last care coordination call/Pharmacist visit. (If appropriate, list visit date, provider name)  No medication changes indicated OR if recent visit, treatment plan  here.  BP Readings from Last 3 Encounters:  01/28/22 126/60  12/31/21 (!) 140/62  08/12/21 (!) 142/60    Lab Results  Component Value Date   HGBA1C 6.1 (A) 12/31/2021     Patient obtains medications through Adherence Packaging  30 Days    Last adherence delivery included: Metformin 1000 mg- 1 tablet at breakfast and dinner Rosuvastatin '20mg'$  - take 1 tablet daily with dinner Glimepiride 2 mg One tablet before breakfast Valsartan-HCTZ 160/25 mg 1 tablet at breakfast Folic acid 1 mg - 1 tablet at breakfast Metoprolol 25 mg One tablet with breakfast and dinner Pantoprazole 40 mg once tablet at bedtime Ozempic 2 mg/dose - Inject 2 mg into the skin once a week One touch lancets    Patient declined last month:  Test strips - not due until 12/15/202 Pen needles 32 gauge- use once daily   Patient is due for next adherence delivery on: 03/25/2022   Called patient and reviewed medications and coordinated delivery.   This delivery to include: Metformin 1000 mg- 1 tablet at breakfast and dinner Rosuvastatin '20mg'$  - take 1 tablet daily with dinner Glimepiride 2 mg One tablet before breakfast Valsartan-HCTZ 160/25 mg 1 tablet at breakfast Folic acid 1 mg - 1 tablet at breakfast Metoprolol 25 mg One tablet with breakfast and dinner Pantoprazole 40 mg once tablet at bedtime Test strips - not due until 12/15/202 Ozempic 2 mg/dose - Inject 2 mg into the skin once a week  Patient will need a short fill: No short fill needed   Coordinated acute fill: No acute fill needed   Patient declined the following medications:   Pen needles 32 gauge- use once daily One touch lancets  Confirmed delivery date of 03/25/2022, advised patient that pharmacy will contact them the morning of delivery.  Care Gaps: AWV - scheduled 02/03/2023 Last BP - 140/62 on 12/31/2021  Last A1C - 6.1 on 12/31/2021 Covid - overdue Urine ACR - overdue  Star Rating Drugs: Glimepiride 2 mg - last filled 02/18/2022  30 [facility] Metformin 1000 mg - last filled 02/18/2022 30 [facility] Rosuvastatin 20 mg - last filled 02/18/2022 30 [facility]  Ozempic 2 mg - last filled 02/18/2022 28 [facility]  Valsartan HCTZ 160/25 mg - last filled 02/18/2022 30 DS at [place] [phone]

## 2022-04-01 ENCOUNTER — Encounter: Payer: Self-pay | Admitting: Family Medicine

## 2022-04-01 ENCOUNTER — Ambulatory Visit (INDEPENDENT_AMBULATORY_CARE_PROVIDER_SITE_OTHER): Payer: Medicare Other | Admitting: Family Medicine

## 2022-04-01 VITALS — BP 120/62 | HR 77 | Temp 98.0°F | Ht 63.5 in | Wt 148.2 lb

## 2022-04-01 DIAGNOSIS — I10 Essential (primary) hypertension: Secondary | ICD-10-CM

## 2022-04-01 DIAGNOSIS — E1140 Type 2 diabetes mellitus with diabetic neuropathy, unspecified: Secondary | ICD-10-CM | POA: Diagnosis not present

## 2022-04-01 LAB — MICROALBUMIN / CREATININE URINE RATIO
Creatinine,U: 70.5 mg/dL
Microalb Creat Ratio: 1 mg/g (ref 0.0–30.0)
Microalb, Ur: 0.7 mg/dL (ref 0.0–1.9)

## 2022-04-01 LAB — POCT GLYCOSYLATED HEMOGLOBIN (HGB A1C): Hemoglobin A1C: 6.3 % — AB (ref 4.0–5.6)

## 2022-04-01 NOTE — Assessment & Plan Note (Signed)
Well controlled, will continue current medications and order the microalbumin urine test today.

## 2022-04-01 NOTE — Assessment & Plan Note (Signed)
Current hypertension medications:       Sig   amLODipine (NORVASC) 2.5 MG tablet (Taking) Take 1 tablet (2.5 mg total) by mouth daily.   metoprolol tartrate (LOPRESSOR) 25 MG tablet (Taking) Take 1 tablet (25 mg total) by mouth 2 (two) times daily.   valsartan-hydrochlorothiazide (DIOVAN-HCT) 160-25 MG tablet (Taking) Take 1 tablet by mouth daily.      BP well controlled on the above medications, will continue as prescribed.

## 2022-04-01 NOTE — Progress Notes (Unsigned)
Established Patient Office Visit  Subjective   Patient ID: Andrea [name], female    DOB: 1955-11-02  Age: 66 y.o. MRN: [id number]  Chief Complaint  Patient presents with   Follow-up    Patient is here for follow up today for her blood pressure. States that she is taking the amlodipine 2.5 mg daily and she is doing well with her BP at home. Denies any side effects to the medication. Is checking her BP at home regularly. BP performed in office today and is normal.   DM-- A1C is 6.3 today, which is stable from previous, she reports compliance with her medication, no new symptoms or issues to report. She is due for her microalbumin test today   Current Outpatient Medications  Medication Instructions   amLODipine (NORVASC) 2.5 mg, Oral, Daily   aspirin 81 mg, Oral, Daily   cetirizine (ZYRTEC) 10 mg, Oral, Daily   folic acid (FOLVITE) 1 mg, Oral, Every morning   glimepiride (AMARYL) 2 MG tablet TAKE ONE TABLET BY MOUTH BEFORE BREAKFAST   glucose blood (ONETOUCH ULTRA) test strip USE TO CHECH BLOOD SUGAR DAILY AND AS NEEDED   Insulin Pen Needle 32G X 4 MM MISC Use as directed   metFORMIN (GLUCOPHAGE) 1000 MG tablet TAKE ONE TABLET BY MOUTH TWICE DAILY   metoprolol tartrate (LOPRESSOR) 25 mg, Oral, 2 times daily   ONETOUCH DELICA LANCETS FINE MISC 1 Stick, Does not apply, Daily   Ozempic (2 MG/DOSE) 2 mg, Subcutaneous, Weekly   pantoprazole (PROTONIX) 40 mg, Oral, Daily   rosuvastatin (CRESTOR) 20 mg, Oral, Every evening   valsartan-hydrochlorothiazide (DIOVAN-HCT) 160-25 MG tablet 1 tablet, Oral, Daily   vitamin B-12 (CYANOCOBALAMIN) 500 mcg, Oral, Daily    Patient Active Problem List   Diagnosis Date Noted   Hyperlipidemia associated with type 2 diabetes mellitus ([place]) 09/09/2018   B12 deficiency 09/09/2018   Pseudophakia 05/15/2016   Type 2 diabetes mellitus with diabetic neuropathy, without long-term current use of insulin ([place]) 05/24/2015   Hx of adenomatous colonic polyps  08/09/2014   Osteoarthritis 05/07/2007   HTN (hypertension) 11/19/2006   Coronary atherosclerosis 11/09/2006      Review of Systems  All other systems reviewed and are negative.     Objective:     BP 120/62 (BP Location: Left Arm, Patient Position: Sitting, Cuff Size: Normal)   Pulse 77   Temp 98 F (36.7 C) (Oral)   Ht 5' 3.5" (1.613 m)   Wt 148 lb 3.2 oz (67.2 kg)   SpO2 96%   BMI 25.84 kg/m  {Vitals History (Optional):23777}  Physical Exam Vitals reviewed.  Constitutional:      Appearance: Normal appearance. She is well-groomed and normal weight.  Eyes:     Conjunctiva/sclera: Conjunctivae normal.  Cardiovascular:     Rate and Rhythm: Normal rate and regular rhythm.     Pulses: Normal pulses.     Heart sounds: S1 normal and S2 normal.  Pulmonary:     Effort: Pulmonary effort is normal.     Breath sounds: Normal air entry.  Neurological:     Mental Status: She is alert and oriented to person, place, and time. Mental status is at baseline.     Gait: Gait is intact.  Psychiatric:        Mood and Affect: Mood and affect normal.        Speech: Speech normal.        Behavior: Behavior normal.  Judgment: Judgment normal.      Results for orders placed or performed in visit on 04/01/22  Microalbumin/Creatinine Ratio, Urine  Result Value Ref Range   Microalb, Ur 0.7 0.0 - 1.9 mg/dL   Creatinine,U 70.5 mg/dL   Microalb Creat Ratio 1.0 0.0 - 30.0 mg/g  POC HgB A1c  Result Value Ref Range   Hemoglobin A1C 6.3 (A) 4.0 - 5.6 %   HbA1c POC (<> result, manual entry)     HbA1c, POC (prediabetic range)     HbA1c, POC (controlled diabetic range)      {Labs (Optional):23779}  The ASCVD Risk score (Arnett DK, et al., 2019) failed to calculate for the following reasons:   The valid total cholesterol range is 130 to 320 mg/dL    Assessment & Plan:   Problem List Items Addressed This Visit       Unprioritized   HTN (hypertension) (Chronic)    Current  hypertension medications:       Sig   amLODipine (NORVASC) 2.5 MG tablet (Taking) Take 1 tablet (2.5 mg total) by mouth daily.   metoprolol tartrate (LOPRESSOR) 25 MG tablet (Taking) Take 1 tablet (25 mg total) by mouth 2 (two) times daily.   valsartan-hydrochlorothiazide (DIOVAN-HCT) 160-25 MG tablet (Taking) Take 1 tablet by mouth daily.     BP well controlled on the above medications, will continue as prescribed.       Type 2 diabetes mellitus with diabetic neuropathy, without long-term current use of insulin ([place]) - Primary    Well controlled, will continue current medications and order the microalbumin urine test today.      Relevant Orders   POC HgB A1c (Completed)   Microalbumin/Creatinine Ratio, Urine (Completed)    Return in about 5 months (around 08/31/2022) for follow up on DM, will need bloodwork.    Farrel Conners, MD

## 2022-04-11 ENCOUNTER — Telehealth: Payer: Self-pay | Admitting: Pharmacist

## 2022-04-11 NOTE — Progress Notes (Signed)
Chronic Care Management Pharmacy Assistant   Name: Andrea [name]  MRN: [id number] DOB: [birth year]  Reason for Encounter: Medication Review / Medication Coordination Call   Recent office visits:  04/01/2022 [name] MD - Patient was seen for Type 2 diabetes mellitus with diabetic neuropathy, without long-term current use of insulin and primary hypertension. No medication changes. Follow up in 5 months.   Recent consult visits:  None  Hospital visits:  None  Medications: Outpatient Encounter Medications as of 04/11/2022  Medication Sig   amLODipine (NORVASC) 2.5 MG tablet Take 1 tablet (2.5 mg total) by mouth daily.   aspirin 81 MG tablet Take 81 mg by mouth daily.     cetirizine (ZYRTEC) 10 MG tablet Take 10 mg by mouth daily.   folic acid (FOLVITE) 1 MG tablet Take 1 tablet (1 mg total) by mouth every morning.   glimepiride (AMARYL) 2 MG tablet TAKE ONE TABLET BY MOUTH BEFORE BREAKFAST   glucose blood (ONETOUCH ULTRA) test strip USE TO CHECH BLOOD SUGAR DAILY AND AS NEEDED   Insulin Pen Needle 32G X 4 MM MISC Use as directed   metFORMIN (GLUCOPHAGE) 1000 MG tablet TAKE ONE TABLET BY MOUTH TWICE DAILY   metoprolol tartrate (LOPRESSOR) 25 MG tablet Take 1 tablet (25 mg total) by mouth 2 (two) times daily.   ONETOUCH DELICA LANCETS FINE MISC 1 Stick by Does not apply route daily.   pantoprazole (PROTONIX) 40 MG tablet Take 1 tablet (40 mg total) by mouth daily.   rosuvastatin (CRESTOR) 20 MG tablet Take 1 tablet (20 mg total) by mouth every evening.   Semaglutide, 2 MG/DOSE, (OZEMPIC, 2 MG/DOSE,) 8 MG/3ML SOPN Inject 2 mg into the skin once a week.   valsartan-hydrochlorothiazide (DIOVAN-HCT) 160-25 MG tablet Take 1 tablet by mouth daily.   vitamin B-12 (CYANOCOBALAMIN) 500 MCG tablet Take 500 mcg by mouth daily.    No facility-administered encounter medications on file as of 04/11/2022.   Reviewed chart for medication changes ahead of medication coordination call.  BP  Readings from Last 3 Encounters:  04/01/22 120/62  01/28/22 126/60  12/31/21 (!) 140/62    Lab Results  Component Value Date   HGBA1C 6.3 (A) 04/01/2022     Patient obtains medications through Adherence Packaging  30 Days    Last adherence delivery included: Metformin 1000 mg- 1 tablet at breakfast and dinner Rosuvastatin '20mg'$  - take 1 tablet daily with dinner Glimepiride 2 mg One tablet before breakfast Valsartan-HCTZ 160/25 mg 1 tablet at breakfast Folic acid 1 mg - 1 tablet at breakfast Metoprolol 25 mg One tablet with breakfast and dinner Pantoprazole 40 mg once tablet at bedtime Test strips - not due until 12/15/202 Ozempic 2 mg/dose - Inject 2 mg into the skin once a week   Patient declined last month:  Pen needles 32 gauge- use once daily One touch lancets   Patient is due for next adherence delivery on: 04/23/2022   Called patient and reviewed medications and coordinated delivery.   This delivery to include: Metformin 1000 mg- 1 tablet at breakfast and dinner Rosuvastatin '20mg'$  - take 1 tablet daily with dinner Glimepiride 2 mg One tablet before breakfast Valsartan-HCTZ 160/25 mg 1 tablet at breakfast Folic acid 1 mg - 1 tablet at breakfast Metoprolol 25 mg One tablet with breakfast and dinner Pantoprazole 40 mg once tablet at bedtime Ozempic 2 mg/dose - Inject 2 mg into the skin once a week   Patient declined the following medications:  Patient has plenty on hand  Pen needles 32 gauge- use once daily One touch lancets Test strips   Confirmed delivery date of 04/23/2022, advised patient that pharmacy will contact them the morning of delivery.  Care Gaps: AWV - scheduled 02/03/2023 Last BP - 120/62 on 04/01/2022  Last A1C - 6.3 on 04/01/2022  Star Rating Drugs: Glimepiride 2 mg - last filled 03/19/2022 30 [facility] Metformin 1000 mg - last filled 03/19/2022 30 [facility] Rosuvastatin 20 mg - last filled 03/19/2022 30 [facility]  Ozempic 2 mg -  last filled 03/24/2022 28 [facility]  Valsartan HCTZ 160/25 mg - last filled 03/19/2022 30 DS at [place] [phone]

## 2022-04-15 ENCOUNTER — Telehealth: Payer: Self-pay | Admitting: Pharmacist

## 2022-04-15 NOTE — Progress Notes (Signed)
Reason for Encounter: Appointment Reminder  Contacted patient on 04/15/2022   Recent office visits:  04/01/2022 Andrea Freshwater MD - Patient was seen for Type 2 diabetes mellitus with diabetic neuropathy, without long-term current use of insulin and an additional concern. No medication changes.   Recent consult visits:  None  Hospital visits:  None  Medications: Outpatient Encounter Medications as of 04/15/2022  Medication Sig   amLODipine (NORVASC) 2.5 MG tablet Take 1 tablet (2.5 mg total) by mouth daily.   aspirin 81 MG tablet Take 81 mg by mouth daily.     cetirizine (ZYRTEC) 10 MG tablet Take 10 mg by mouth daily.   folic acid (FOLVITE) 1 MG tablet Take 1 tablet (1 mg total) by mouth every morning.   glimepiride (AMARYL) 2 MG tablet TAKE ONE TABLET BY MOUTH BEFORE BREAKFAST   glucose blood (ONETOUCH ULTRA) test strip USE TO CHECH BLOOD SUGAR DAILY AND AS NEEDED   Insulin Pen Needle 32G X 4 MM MISC Use as directed   metFORMIN (GLUCOPHAGE) 1000 MG tablet TAKE ONE TABLET BY MOUTH TWICE DAILY   metoprolol tartrate (LOPRESSOR) 25 MG tablet Take 1 tablet (25 mg total) by mouth 2 (two) times daily.   ONETOUCH DELICA LANCETS FINE MISC 1 Stick by Does not apply route daily.   pantoprazole (PROTONIX) 40 MG tablet Take 1 tablet (40 mg total) by mouth daily.   rosuvastatin (CRESTOR) 20 MG tablet Take 1 tablet (20 mg total) by mouth every evening.   Semaglutide, 2 MG/DOSE, (OZEMPIC, 2 MG/DOSE,) 8 MG/3ML SOPN Inject 2 mg into the skin once a week.   valsartan-hydrochlorothiazide (DIOVAN-HCT) 160-25 MG tablet Take 1 tablet by mouth daily.   vitamin B-12 (CYANOCOBALAMIN) 500 MCG tablet Take 500 mcg by mouth daily.    No facility-administered encounter medications on file as of 04/15/2022.   Lab Results  Component Value Date/Time   HGBA1C 6.3 (A) 04/01/2022 09:36 AM   HGBA1C 6.1 (A) 12/31/2021 09:38 AM   HGBA1C 7.2 (H) 08/12/2021 10:26 AM   HGBA1C 7.0 (H) 02/08/2021 11:38 AM   MICROALBUR 0.7  04/01/2022 09:48 AM   MICROALBUR 2.2 (H) 02/08/2021 11:38 AM    BP Readings from Last 3 Encounters:  04/01/22 120/62  01/28/22 126/60  12/31/21 (!) 140/62    Unsuccessful attempt to reach patient. Left patient message reminding patient of appointment. Via the telephone with [name] PharmD, on 04/16/2022 at 11:15.   Star Rating Drugs:  Glimepiride 2 mg - last filled 03/19/2022 30 [facility] Metformin 1000 mg - last filled 03/19/2022 30 [facility] Rosuvastatin 20 mg - last filled 03/19/2022 30 [facility]  Ozempic 2 mg - last filled 03/24/2022 28 [facility]  Valsartan HCTZ 160/25 mg - last filled 03/19/2022 30 DS at [place]: AWV - scheduled 02/03/2023 Last BP - 120/62 on 04/01/2022  Last A1C - 6.3 on 04/01/2022   [place] Pharmacist Assistant [phone]

## 2022-04-16 ENCOUNTER — Telehealth: Payer: Medicare Other

## 2022-04-16 ENCOUNTER — Ambulatory Visit: Payer: Self-pay | Admitting: Pharmacist

## 2022-04-16 DIAGNOSIS — I10 Essential (primary) hypertension: Secondary | ICD-10-CM

## 2022-04-16 DIAGNOSIS — E1140 Type 2 diabetes mellitus with diabetic neuropathy, unspecified: Secondary | ICD-10-CM

## 2022-04-16 NOTE — Patient Instructions (Signed)
Hi Andrea [name],  It was great to get to speak with you again! Don't forget to alternate different times of day for checking your blood sugars.  Please reach out to me if you have any questions or need anything!  Best, Maddie  [name], PharmD, [place] Pharmacist [place] [facility]

## 2022-04-16 NOTE — Progress Notes (Signed)
Care Management & Coordination Services Pharmacy Note  04/16/2022 Name:  Andrea [name] MRN:  [id number] DOB:  1955/11/17  Summary: A1c at goal < 7% BP is at goal < 130/80  Recommendations/Changes made from today's visit: -Recommended stopping glimepiride due to duration of therapy and A1c at goal -Recommended alternating times of day for checking blood sugars  Follow up plan: Follow up after discussion with PCP DM assessment in 3-4 weeks Follow up in 6 months  Subjective: Andrea [name] is an 67 y.o. year old female who is a primary patient of Andrea Conners, MD.  The care coordination team was consulted for assistance with disease management and care coordination needs.    Engaged with patient by telephone for follow up visit.  Patient Care Team: Andrea Conners, MD as PCP - General (Family Medicine) Rutherford Guys, MD as Consulting Physician (Ophthalmology) [name], [facility] as Pharmacist (Pharmacist)  Recent office visits: 04/01/2022 [name] MD - Patient was seen for Type 2 diabetes mellitus with diabetic neuropathy, without long-term current use of insulin and an additional concern. No medication changes.    01/28/2022 Glenna Durand LPN - Medicare annual wellness exam.  01/03/22 [name], MD: Patient presented for video visit for COVID 19 infection. Prescribed Paxlovid.   12/31/21 [name], MD: Patient presented for [facility] visit. A1c decreased to 6.1%. Prescribed amlodipine 2.5 mg daily and follow up in 3 months.   Recent consult visits: 01/29/2022 Gardiner Barefoot DPM - Patient was seen for Pain due to onychomycosis of toenails of both feet and additional concerns.No medication changes. Follow up in 4 months.   Hospital visits: None in previous 6 months   Objective:  Lab Results  Component Value Date   CREATININE 0.96 08/12/2021   BUN 15 08/12/2021   GFR 60.96 08/12/2021   GFRNONAA >60 05/12/2018   GFRAA >60 05/12/2018   NA 136  08/12/2021   K 4.6 08/12/2021   CALCIUM 9.7 08/12/2021   CO2 27 08/12/2021   GLUCOSE 127 (H) 08/12/2021    Lab Results  Component Value Date/Time   HGBA1C 6.3 (A) 04/01/2022 09:36 AM   HGBA1C 6.1 (A) 12/31/2021 09:38 AM   HGBA1C 7.2 (H) 08/12/2021 10:26 AM   HGBA1C 7.0 (H) 02/08/2021 11:38 AM   GFR 60.96 08/12/2021 10:26 AM   GFR 65.11 05/20/2021 09:48 AM   MICROALBUR 0.7 04/01/2022 09:48 AM   MICROALBUR 2.2 (H) 02/08/2021 11:38 AM    Last diabetic Eye exam:  Lab Results  Component Value Date/Time   HMDIABEYEEXA No Retinopathy 06/20/2021 12:00 AM    Last diabetic Foot exam:  Lab Results  Component Value Date/Time   HMDIABFOOTEX done 07/01/2009 12:00 AM     Lab Results  Component Value Date   CHOL 121 08/12/2021   HDL 29.30 (L) 08/12/2021   LDLCALC 46 12/21/2019   LDLDIRECT 55.0 08/12/2021   TRIG 350.0 (H) 08/12/2021   CHOLHDL 4 08/12/2021       Latest Ref Rng & Units 08/12/2021   10:26 AM 05/20/2021    9:48 AM 02/08/2021   11:38 AM  Hepatic Function  Total Protein 6.0 - 8.3 g/dL 7.6  7.2  7.4   Albumin 3.5 - 5.2 g/dL 4.3  4.1  4.3   AST 0 - 37 U/L _0 ALT 0 - 35 U/L _1 Alk Phosphatase 39 - 117 U/L 55  52  47   Total Bilirubin  0.2 - 1.2 mg/dL 0.4  0.3  0.4     Lab Results  Component Value Date/Time   TSH 3.35 02/08/2021 11:38 AM   TSH 3.29 12/21/2019 08:54 AM       Latest Ref Rng & Units 05/20/2021    9:48 AM 02/08/2021   11:38 AM 12/21/2019    8:54 AM  CBC  WBC 4.0 - 10.5 K/uL 9.7  6.2  7.4   Hemoglobin 12.0 - 15.0 g/dL 12.2  12.6  12.6   Hematocrit 36.0 - 46.0 % 35.6  37.0  37.2   Platelets 150.0 - 400.0 K/uL 239.0  265.0  259     Lab Results  Component Value Date/Time   VD25OH 35 12/21/2019 08:54 AM   VITAMINB12 >1504 (H) 05/20/2021 09:48 AM   EXBMWUXL24 401 02/08/2021 11:38 AM    Clinical ASCVD: No  The ASCVD Risk score (Arnett DK, et al., 2019) failed to calculate for the following reasons:   The valid total cholesterol  range is 130 to 320 mg/dL       01/28/2022    9:33 AM 12/31/2021   10:03 AM 08/12/2021    9:24 AM  Depression screen PHQ 2/9  Decreased Interest 0 0 0  Down, Depressed, Hopeless 0 0 0  PHQ - 2 Score 0 0 0  Altered sleeping  2 0  Tired, decreased energy  0 0  Change in appetite  2 0  Feeling bad or failure about yourself   0 0  Trouble concentrating  0 0  Moving slowly or fidgety/restless  0 0  Suicidal thoughts  0 0  PHQ-9 Score  4 0  Difficult doing work/chores  Not difficult at all       Social History   Tobacco Use  Smoking Status Former   Types: E-cigarettes   Quit date: 06/20/2010   Years since quitting: 11.8  Smokeless Tobacco Never  Tobacco Comments   smoked 1ppd for> 30 years/smokes occasionally,some vapor cigarettes   BP Readings from Last 3 Encounters:  04/01/22 120/62  01/28/22 126/60  12/31/21 (!) 140/62   Pulse Readings from Last 3 Encounters:  04/01/22 77  01/28/22 78  12/31/21 79   Wt Readings from Last 3 Encounters:  04/01/22 148 lb 3.2 oz (67.2 kg)  01/28/22 145 lb 11.2 oz (66.1 kg)  01/03/22 147 lb (66.7 kg)   BMI Readings from Last 3 Encounters:  04/01/22 25.84 kg/m  01/28/22 25.40 kg/m  01/03/22 26.04 kg/m    Allergies  Allergen Reactions   Plavix [Clopidogrel Bisulfate]     Caused platelets to drop    Medications Reviewed Today     Reviewed by Agnes Lawrence, CMA (Certified Medical Assistant) on 04/01/22 at [phone]  Med List Status: <None>   Medication Order Taking? Sig Documenting Provider Last Dose Status Informant  amLODipine (NORVASC) 2.5 MG tablet [id number] Yes Take 1 tablet (2.5 mg total) by mouth daily. Andrea Conners, MD Taking Active   aspirin 81 MG tablet [id number] Yes Take 81 mg by mouth daily.   [provider] Taking Active Self  cetirizine (ZYRTEC) 10 MG tablet [id number] Yes Take 10 mg by mouth daily. [provider] Taking Active Self  folic acid (FOLVITE) 1 MG tablet [id number] Yes Take 1 tablet  (1 mg total) by mouth every morning. Andrea Conners, MD Taking Active   glimepiride (AMARYL) 2 MG tablet [id number] Yes TAKE ONE TABLET BY MOUTH BEFORE Andrea Shi, MD Taking Active  glucose blood (ONETOUCH ULTRA) test strip [id number] Yes USE TO [facility] BLOOD SUGAR DAILY AND AS NEEDED Caren Macadam, MD Taking Active   Insulin Pen Needle 32G X 4 MM MISC [id number] Yes Use as directed Andrea Conners, MD Taking Active   metFORMIN (GLUCOPHAGE) 1000 MG tablet [id number] Yes TAKE ONE TABLET BY MOUTH TWICE DAILY Andrea Conners, MD Taking Active   metoprolol tartrate (LOPRESSOR) 25 MG tablet [id number] Yes Take 1 tablet (25 mg total) by mouth 2 (two) times daily. Andrea Conners, MD Taking Active   ONETOUCH [place] [id number] Yes 1 Stick by Does not apply route daily. Ricard Dillon, MD Taking Active Self  pantoprazole (PROTONIX) 40 MG tablet [id number] Yes Take 1 tablet (40 mg total) by mouth daily. Andrea Conners, MD Taking Active   rosuvastatin (CRESTOR) 20 MG tablet [id number] Yes Take 1 tablet (20 mg total) by mouth every evening. Andrea Conners, MD Taking Active   Semaglutide, 2 MG/DOSE, (OZEMPIC, 2 MG/DOSE,) 8 MG/3ML SOPN [id number] Yes Inject 2 mg into the skin once a week. Andrea Conners, MD Taking Active   valsartan-hydrochlorothiazide [facility]) 160-25 MG tablet [id number] Yes Take 1 tablet by mouth daily. Andrea Conners, MD Taking Active   vitamin B-12 (CYANOCOBALAMIN) 500 MCG tablet [id number] Yes Take 500 mcg by mouth daily.  [provider] Taking Active Self            Patient Active Problem List   Diagnosis Date Noted   Hyperlipidemia associated with type 2 diabetes mellitus ([place]) 09/09/2018   B12 deficiency 09/09/2018   Pseudophakia 05/15/2016   Type 2 diabetes mellitus with diabetic neuropathy, without long-term current use of insulin ([place]) 05/24/2015   Hx of adenomatous colonic polyps 08/09/2014    Osteoarthritis 05/07/2007   HTN (hypertension) 11/19/2006   Coronary atherosclerosis 11/09/2006    Immunization History  Administered Date(s) Administered   Fluad Quad(high Dose 65+) 12/21/2019, 02/06/2021, 12/31/2021   Influenza Whole 01/12/1997, 01/05/2009   Influenza,inj,Quad PF,6+ Mos 01/12/2015, 12/27/2015, 03/09/2017, 12/01/2018   Influenza-Unspecified 02/12/2014, 01/19/2018   PFIZER(Purple Top)SARS-COV-2 Vaccination 07/07/2019, 08/01/2019, 02/06/2021   Pneumococcal Conjugate-13 12/01/2018   Pneumococcal Polysaccharide-23 04/24/2008, 01/31/2014, 12/21/2019   Td 11/13/1997, 04/27/2009   Tdap 05/20/2020   Zoster Recombinat (Shingrix) 08/22/2017, 11/12/2017, 01/12/2018    Compliance/Adherence/Medication fill history: Care Gaps: Last BP - 120/62 on 04/01/2022  Last A1C - 6.3 on 04/01/2022  Star-Rating Drugs: Glimepiride 2 mg - last filled 03/19/2022 30 DS at [facility] Metformin 1000 mg - last filled 03/19/2022 30 DS at [facility] Rosuvastatin 20 mg - last filled 03/19/2022 30 DS at [facility]  Ozempic 2 mg - last filled 03/24/2022 28 DS at [facility]  Valsartan HCTZ 160/25 mg - last filled 03/19/2022 30 DS at [facility]  SDOH:  (Social Determinants of Health) assessments and interventions performed: Yes (last 11/08/21) SDOH Interventions    Flowsheet Row Clinical Support from 01/28/2022 in [place] at [place] Management from 11/08/2021 in [place] at [place] Management from 07/18/2019 in [place] at [place] Interventions Intervention Not Indicated -- --  Transportation Interventions Intervention Not Indicated -- --  Financial Strain Interventions Intervention Not Indicated Intervention Not Indicated Other (Comment)  [Patient endorsed Januvia is cost prohibitive. Currently Januvia is on hold. Will reassess at follow up. Advised patient to call me for if additional help is needed.]  Physical  Activity Interventions Patient Refused, Other (Comments) -- --  Stress Interventions Intervention Not Indicated -- --      SDOH Screenings   Food Insecurity: No Food Insecurity (01/28/2022)  Housing: Low Risk  (01/15/2021)  Transportation Needs: No Transportation Needs (01/28/2022)  Depression (PHQ2-9): Low Risk  (01/28/2022)  Financial Resource Strain: Low Risk  (01/28/2022)  Physical Activity: Inactive (01/28/2022)  Social Connections: Socially Isolated (01/15/2021)  Stress: No Stress Concern Present (01/28/2022)  Tobacco Use: Medium Risk (04/01/2022)    Medication Assistance: None required.  Patient affirms current coverage meets needs.  Medication Access: Within the past 30 days, how often has patient missed a dose of medication? none Is a pillbox or other method used to improve adherence? Yes  Factors that may affect medication adherence? financial need Are meds synced by current pharmacy? Yes  Are meds delivered by current pharmacy? Yes  Does patient experience delays in picking up medications due to transportation concerns? No   [facility] Reviewed: Is patient disadvantaged to use [facility]?: No  Current Rx insurance plan: [facility] Name and location of Current pharmacy:  [facility] - [place], [place] - [address] [place] [address] Phone: [phone] Fax: [phone]  [facility] [place], [place] [address] Phone: [phone] Fax: [phone]  [facility] services reviewed with patient today?: Yes  Patient requests to transfer care to [facility]?:  Patient is already using [facility] Reason patient declined to change pharmacies:  Patient is already using [facility]  Patient reports her blood sugars at home are 120-130s first thing in the morning. Sometimes she checks it at  bedtime and it's a little bit higher and usually comes down. She does sometimes check it 1-2 hours after eating a meal.   She doesn't do any walking right now with the weather being colder.   Patient went to her older's son house for Christmas and enjoyed this.  Assessment/Plan  Patient Care Plan: [facility]     Problem Identified: Problem: Hypertension, Hyperlipidemia, Diabetes and vitamin B12 deficiency      Long-Range Goal: Patient-Specific Goal   Start Date: 07/17/2020  Expected End Date: 07/17/2021  Recent Progress: On track  Priority: High  Note:   Hypertension (BP goal <130/80) -Controlled -Current treatment: Metoprolol tartrate (Lopressor) 22m, 0.5 (one-half) tablet twice daily - Appropriate, Effective, Safe, Accessible Valsartan/HCTZ 160/25 mg, 1 tablet once daily - Appropriate, Effective, Safe, Accessible Amlodipine 2.5 mg 1 tablet daily - Appropriate, Effective, Safe, Accessible -Medications previously tried: BScientist, water quality([employer]ul[employer]) -Current home readings: 120-130, 129/61, 133/69, 128/61, 129/61, 132/62, 116/66, 126/64, 130/63, 104/67 (checking every day) -Current dietary habits: only using salt when eating tomato sandwiches; does not use on any thing else -Current exercise habits: not walking consistently - was walking 15-20 minutes per day -Denies hypotensive/hypertensive symptoms -Educated on Exercise goal of 150 minutes per week; Importance of home blood pressure monitoring; -Counseled to monitor BP at home weekly, document, and provide log at future appointments -Counseled on diet and exercise extensively Recommended to continue current medication  Hyperlipidemia/coronary atherosclerosis: (LDL goal < 70, TG <150 ) -Controlled for LDL; TGs uncontrolled -Current treatment: Rosuvastatin (Crestor) 265m 1 tablet once daily - Appropriate, Query effective, Safe, Accessible Aspirin 8160m1 tablet once daily - Appropriate, Effective, Safe,  Accessible -Medications previously tried: none  -Current dietary patterns: focusing on decreasing bread and soda -Current exercise habits: not consistently exercising -Educated  on Cholesterol goals;  Exercise goal of 150 minutes per week; -Counseled on diet and exercise extensively Recommended to continue current medication  Diabetes (A1c goal <7%) -Controlled -Current medications: glimepiride (Amaryl) 54m, 1 tablet once daily before breakfast - Appropriate, Effective, Safe, Accessible Metformin 10071m 1 tablet twice daily - Appropriate, Effective, Safe, Accessible Ozempic 2 mg inject once weekly (around 1.75 mg) - Appropriate, Effective, Safe, Accessible -Medications previously tried: Onglyza (cost)   -Current home glucose readings:  fasting glucose: 120-130s post prandial glucose: 140s Lowest: not checking -Denies hypoglycemic/hyperglycemic symptoms -Current meal patterns:  breakfast: eggs, grits & bacon and toast lunch: did not discuss  dinner: Me[place]end[place]ast night; cereal snacks: not many desserts, peanut butter crackers drinks: 1/2 can of soda and juice, not much water -Current exercise: not exercising consistently; was walking 3 times a week for about 20 minutes but is having more knee pain/popping -Educated on A1c and blood sugar goals; Exercise goal of 150 minutes per week; Benefits of routine self-monitoring of blood sugar; Carbohydrate counting and/or plate method -Counseled to check feet daily and get yearly eye exams -Counseled on diet and exercise extensively Recommended stopping glimepiride.  Vitamin B12 deficiency (Goal: vitamin B12 211-911) -Controlled -Current treatment  Vitamin B12 500 mcg, 1 tablet once daily - Appropriate, Effective, Safe, Accessible -Medications previously tried: none  -Recommended to continue current medication  Health Maintenance -Vaccine gaps: none -Current therapy:  Cetirizine 10 mg 1 tablet daily -Educated on Cost vs  benefit of each product must be carefully weighed by individual consumer -Patient is satisfied with current therapy and denies issues -Recommended to continue current medication  Follow Up Plan: The care management team will reach out to the patient again over the next 7 days.        MaJeni SallesPharmD, BCLavacaharmacist [place]

## 2022-05-12 ENCOUNTER — Telehealth: Payer: Self-pay | Admitting: Pharmacist

## 2022-05-12 NOTE — [place] Notes (Signed)
Care Management & Coordination Services Pharmacy Team  Reason for Encounter: Medication coordination and delivery  Contacted patient to discuss medications and coordinate delivery from [facility]. Spoke with patient on 05/12/2022   Cycle dispensing form sent to [facility] for review.   Last adherence delivery date:04/23/2022      Patient is due for next adherence delivery on: 05/22/2022  This delivery to include: Adherence Packaging  30 Days  Metformin 1000 mg- 1 tablet at breakfast and dinner Rosuvastatin '20mg'$  - take 1 tablet daily with dinner Pantoprazole 40 mg once tablet at bedtime Valsartan-HCTZ 160/25 mg 1 tablet at breakfast Folic acid 1 mg - 1 tablet at breakfast Metoprolol 25 mg One tablet with breakfast and dinner Ozempic 2 mg/dose - Inject 2 mg into the skin once a week  Patient declined the following medications this month: Pen needles 32 gauge- use once daily One touch lancets Glimepiride 2 mg - to be discontinued per 04/16/22 note.  Confirmed delivery date of 05/22/2022, advised patient that pharmacy will contact them the morning of delivery.  Any concerns about your medications? Patient plans to call Dr. Legrand Como and discuss recent blood sugar readings, she feels she may need to restart Glimepiride.   How often do you forget or accidentally miss a dose? Patient declines any missed doses  Is patient in packaging Yes  What is the date on your next pill pack? 05/12/2022  Any concerns or issues with your packaging? Patient denies  Recent blood pressure readings are as follows: Patient declines to answer at this time  Recent blood glucose readings are as follows: Pt declines to answer at this time  Chart review:  Recent office visits:  None  Recent consult visits:  None  Hospital visits:  None  Medications: Outpatient Encounter Medications as of 05/12/2022  Medication Sig   amLODipine (NORVASC) 2.5 MG tablet Take 1 tablet (2.5 mg total) by  mouth daily.   aspirin 81 MG tablet Take 81 mg by mouth daily.     cetirizine (ZYRTEC) 10 MG tablet Take 10 mg by mouth daily.   folic acid (FOLVITE) 1 MG tablet Take 1 tablet (1 mg total) by mouth every morning.   glimepiride (AMARYL) 2 MG tablet TAKE ONE TABLET BY MOUTH BEFORE BREAKFAST   glucose blood (ONETOUCH ULTRA) test strip USE TO CHECH BLOOD SUGAR DAILY AND AS NEEDED   Insulin Pen Needle 32G X 4 MM MISC Use as directed   metFORMIN (GLUCOPHAGE) 1000 MG tablet TAKE ONE TABLET BY MOUTH TWICE DAILY   metoprolol tartrate (LOPRESSOR) 25 MG tablet Take 1 tablet (25 mg total) by mouth 2 (two) times daily.   ONETOUCH DELICA LANCETS FINE MISC 1 Stick by Does not apply route daily.   pantoprazole (PROTONIX) 40 MG tablet Take 1 tablet (40 mg total) by mouth daily.   rosuvastatin (CRESTOR) 20 MG tablet Take 1 tablet (20 mg total) by mouth every evening.   Semaglutide, 2 MG/DOSE, (OZEMPIC, 2 MG/DOSE,) 8 MG/3ML SOPN Inject 2 mg into the skin once a week.   valsartan-hydrochlorothiazide (DIOVAN-HCT) 160-25 MG tablet Take 1 tablet by mouth daily.   vitamin B-12 (CYANOCOBALAMIN) 500 MCG tablet Take 500 mcg by mouth daily.    No facility-administered encounter medications on file as of 05/12/2022.   BP Readings from Last 3 Encounters:  04/01/22 120/62  01/28/22 126/60  12/31/21 (!) 140/62    Pulse Readings from Last 3 Encounters:  04/01/22 77  01/28/22 78  12/31/21 79    Lab  Results  Component Value Date/Time   HGBA1C 6.3 (A) 04/01/2022 09:36 AM   HGBA1C 6.1 (A) 12/31/2021 09:38 AM   HGBA1C 7.2 (H) 08/12/2021 10:26 AM   HGBA1C 7.0 (H) 02/08/2021 11:38 AM   Lab Results  Component Value Date   CREATININE 0.96 08/12/2021   BUN 15 08/12/2021   GFR 60.96 08/12/2021   GFRNONAA >60 05/12/2018   GFRAA >60 05/12/2018   NA 136 08/12/2021   K 4.6 08/12/2021   CALCIUM 9.7 08/12/2021   CO2 27 08/12/2021   [place] Pharmacist Assistant [phone]

## 2022-05-22 ENCOUNTER — Telehealth: Payer: Self-pay | Admitting: Family Medicine

## 2022-05-22 NOTE — Telephone Encounter (Signed)
Ok looks too high, please have her restart the medication and we will continue to fill it for her

## 2022-05-22 NOTE — Telephone Encounter (Signed)
I called the patient for more information.  Patient stated her glucose readings have been elevated since discontinuing Glimepiride as below: 2/3-AM--177 2/4-AM--167 2/5-AM--170---PM 130 2/6-AM--168---PM 177 2/7-AM--168 2/8-AM--194  Message sent to PCP.

## 2022-05-22 NOTE — Telephone Encounter (Signed)
Patient informed of the message below, will restart Glimepiride and stated she currently has enough medication at home.

## 2022-05-22 NOTE — Telephone Encounter (Signed)
Blood sugar rising since she has been taken off glimepiride (AMARYL) 2 MG tablet

## 2022-06-02 ENCOUNTER — Ambulatory Visit: Payer: Medicare Other | Admitting: Podiatry

## 2022-06-12 ENCOUNTER — Telehealth: Payer: Self-pay

## 2022-06-12 NOTE — [place] Notes (Signed)
Care Management & Coordination Services Pharmacy Team  Reason for Encounter: Medication coordination and delivery  Contacted patient to discuss medications and coordinate delivery from [facility]. Spoke with patient on 06/12/2022   Cycle dispensing form sent to [facility] for review.   Last adherence delivery date:05/22/2022      Patient is due for next adherence delivery on: 06/23/2022  This delivery to include: Adherence Packaging  30 Days  Metformin 1000 mg- 1 tablet at breakfast and dinner Rosuvastatin '20mg'$  - take 1 tablet daily with dinner Pantoprazole 40 mg once tablet at bedtime Valsartan-HCTZ 160/25 mg 1 tablet at breakfast Folic acid 1 mg - 1 tablet at breakfast Metoprolol 25 mg - 1 tablet with breakfast and dinner Ozempic 2 mg/dose - Inject 2 mg into the skin once a week Glimepiride 2 mg - 1 tablet before breakfast  Patient declined the following medications this month: None  Confirmed delivery date of 06/23/2022, advised patient that pharmacy will contact them the morning of delivery.  Any concerns about your medications? Patient denies  How often do you forget or accidentally miss a dose? Patient states 3 PM doses (she pulled them off and has set them aside)  Is patient in packaging Yes  What is the date on your next pill pack? 06/12/2022 PM dose  Any concerns or issues with your packaging? Patient denies  Recent blood pressure readings are as follows: last 3 readings 127/63, 129/61 and 123/66  Recent blood glucose readings are as follows: starting 06/11/22 FBS 152, PM 103,  06/10/22 FBS 162, PM 139  Chart review: Recent office visits:  None  Recent consult visits:  None  Hospital visits:  None  Medications: Outpatient Encounter Medications as of 06/12/2022  Medication Sig   amLODipine (NORVASC) 2.5 MG tablet Take 1 tablet (2.5 mg total) by mouth daily.   aspirin 81 MG tablet Take 81 mg by mouth daily.     cetirizine (ZYRTEC) 10 MG tablet  Take 10 mg by mouth daily.   folic acid (FOLVITE) 1 MG tablet Take 1 tablet (1 mg total) by mouth every morning.   glimepiride (AMARYL) 2 MG tablet TAKE ONE TABLET BY MOUTH BEFORE BREAKFAST   glucose blood (ONETOUCH ULTRA) test strip USE TO CHECH BLOOD SUGAR DAILY AND AS NEEDED   Insulin Pen Needle 32G X 4 MM MISC Use as directed   metFORMIN (GLUCOPHAGE) 1000 MG tablet TAKE ONE TABLET BY MOUTH TWICE DAILY   metoprolol tartrate (LOPRESSOR) 25 MG tablet Take 1 tablet (25 mg total) by mouth 2 (two) times daily.   ONETOUCH DELICA LANCETS FINE MISC 1 Stick by Does not apply route daily.   pantoprazole (PROTONIX) 40 MG tablet Take 1 tablet (40 mg total) by mouth daily.   rosuvastatin (CRESTOR) 20 MG tablet Take 1 tablet (20 mg total) by mouth every evening.   Semaglutide, 2 MG/DOSE, (OZEMPIC, 2 MG/DOSE,) 8 MG/3ML SOPN Inject 2 mg into the skin once a week.   valsartan-hydrochlorothiazide (DIOVAN-HCT) 160-25 MG tablet Take 1 tablet by mouth daily.   vitamin B-12 (CYANOCOBALAMIN) 500 MCG tablet Take 500 mcg by mouth daily.    No facility-administered encounter medications on file as of 06/12/2022.   BP Readings from Last 3 Encounters:  04/01/22 120/62  01/28/22 126/60  12/31/21 (!) 140/62    Pulse Readings from Last 3 Encounters:  04/01/22 77  01/28/22 78  12/31/21 79    Lab Results  Component Value Date/Time   HGBA1C 6.3 (A) 04/01/2022 09:36 AM  HGBA1C 6.1 (A) 12/31/2021 09:38 AM   HGBA1C 7.2 (H) 08/12/2021 10:26 AM   HGBA1C 7.0 (H) 02/08/2021 11:38 AM   Lab Results  Component Value Date   CREATININE 0.96 08/12/2021   BUN 15 08/12/2021   GFR 60.96 08/12/2021   GFRNONAA >60 05/12/2018   GFRAA >60 05/12/2018   NA 136 08/12/2021   K 4.6 08/12/2021   CALCIUM 9.7 08/12/2021   CO2 27 08/12/2021   [place] Pharmacist Assistant [phone]

## 2022-06-13 ENCOUNTER — Encounter: Payer: Self-pay | Admitting: Podiatry

## 2022-06-13 ENCOUNTER — Ambulatory Visit: Payer: Medicare Other | Admitting: Podiatry

## 2022-06-13 DIAGNOSIS — M79674 Pain in right toe(s): Secondary | ICD-10-CM

## 2022-06-13 DIAGNOSIS — B351 Tinea unguium: Secondary | ICD-10-CM | POA: Diagnosis not present

## 2022-06-13 DIAGNOSIS — M79675 Pain in left toe(s): Secondary | ICD-10-CM | POA: Diagnosis not present

## 2022-06-13 DIAGNOSIS — E0843 Diabetes mellitus due to underlying condition with diabetic autonomic (poly)neuropathy: Secondary | ICD-10-CM | POA: Diagnosis not present

## 2022-06-13 NOTE — Progress Notes (Signed)
This patient returns to my office for at risk foot care.  This patient requires this care by a professional since this patient will be at risk due to having diabetes.  This patient is unable to cut nails herself since the patient cannot reach her nails.These nails are painful walking and wearing shoes.  This patient presents for at risk foot care today.  General Appearance  Alert, conversant and in no acute stress.  Vascular  Dorsalis pedis and posterior tibial  pulses are palpable  bilaterally.  Capillary return is within normal limits  bilaterally. Temperature is within normal limits  bilaterally.  Neurologic  Senn-Weinstein monofilament wire test within normal limits  bilaterally. Muscle power within normal limits bilaterally.  Nails Thick disfigured discolored nails with subungual debris  from hallux to fifth toes bilaterally. No evidence of bacterial infection or drainage bilaterally.Pincer hallux right toenail.  Orthopedic  No limitations of motion  feet .  No crepitus or effusions noted.  No bony pathology or digital deformities noted.  Skin  normotropic skin with no porokeratosis noted bilaterally.  No signs of infections or ulcers noted.     Onychomycosis  Pain in right toes  Pain in left toes  Consent was obtained for treatment procedures.   Mechanical debridement of nails 1-5  bilaterally performed with a nail nipper.  Filed with dremel without incident. Patient has pain in front of left ankle.  Needs to see one of the younger doctors forevaluation of left ankle.   Return office visit    4  months  for nail care.               Told patient to return for periodic foot care and evaluation due to potential at risk complications.   Gardiner Barefoot DPM

## 2022-06-19 ENCOUNTER — Encounter: Payer: Self-pay | Admitting: Family Medicine

## 2022-06-19 DIAGNOSIS — E119 Type 2 diabetes mellitus without complications: Secondary | ICD-10-CM | POA: Diagnosis not present

## 2022-06-19 DIAGNOSIS — H524 Presbyopia: Secondary | ICD-10-CM | POA: Diagnosis not present

## 2022-06-19 DIAGNOSIS — Z961 Presence of intraocular lens: Secondary | ICD-10-CM | POA: Diagnosis not present

## 2022-06-19 LAB — HM DIABETES EYE EXAM

## 2022-06-26 ENCOUNTER — Ambulatory Visit: Payer: Medicare Other | Admitting: Podiatry

## 2022-06-26 DIAGNOSIS — R252 Cramp and spasm: Secondary | ICD-10-CM | POA: Diagnosis not present

## 2022-06-26 DIAGNOSIS — M2042 Other hammer toe(s) (acquired), left foot: Secondary | ICD-10-CM | POA: Diagnosis not present

## 2022-06-26 NOTE — Progress Notes (Signed)
  Subjective:  Patient ID: Andrea [name], female    DOB: January 04, 1956,  MRN: [id number]  Chief Complaint  Patient presents with   Foot Pain    Left foot cramping -Dr. Prudence Davidson patient    67 y.o. female presents with the above complaint. History confirmed with patient.  She notes cramping at night in the toes and splaying of the toes  Objective:  Physical Exam: warm, good capillary refill, no trophic changes or ulcerative lesions, normal DP and PT pulses, normal sensory exam, and good smooth range of motion of midfoot ankle subtalar joint and digital joints.  She has reducible hammertoe deformities  Assessment:   1. Nocturnal foot cramps      Plan:  Patient was evaluated and treated and all questions answered.  We discussed nocturnal cramping and this can be of multiple etiologies including dehydration and electrolyte imbalances.  We also discussed how hammertoe contractures can begin to create cramping and muscle fatigue and stabilization in the intrinsic muscles.  I recommended home stretching exercises and using a trial of drinking tonic water before bed.  She will return to see me as needed if this worsens or does not improve.  No follow-ups on file.

## 2022-06-26 NOTE — Patient Instructions (Signed)
 EXERCISES- RANGE OF MOTION (ROM) AND STRETCHING EXERCISES - Plantar Fasciitis (Heel Spur Syndrome) These exercises may help you when beginning to rehabilitate your injury. Your symptoms may resolve with or without further involvement from your physician, physical therapist or athletic trainer. While completing these exercises, remember:  Restoring tissue flexibility helps normal motion to return to the joints. This allows healthier, less painful movement and activity. An effective stretch should be held for at least 30 seconds. A stretch should never be painful. You should only feel a gentle lengthening or release in the stretched tissue.  RANGE OF MOTION - Toe Extension, Flexion Sit with your right / left leg crossed over your opposite knee. Grasp your toes and gently pull them back toward the top of your foot. You should feel a stretch on the bottom of your toes and/or foot. Hold this stretch for 10 seconds. Now, gently pull your toes toward the bottom of your foot. You should feel a stretch on the top of your toes and or foot. Hold this stretch for 10 seconds. Repeat  times. Complete this stretch 3 times per day.   RANGE OF MOTION - Ankle Dorsiflexion, Active Assisted Remove shoes and sit on a chair that is preferably not on a carpeted surface. Place right / left foot under knee. Extend your opposite leg for support. Keeping your heel down, slide your right / left foot back toward the chair until you feel a stretch at your ankle or calf. If you do not feel a stretch, slide your bottom forward to the edge of the chair, while still keeping your heel down. Hold this stretch for 10 seconds. Repeat 3 times. Complete this stretch 2 times per day.   STRETCH  Gastroc, Standing Place hands on wall. Extend right / left leg, keeping the front knee somewhat bent. Slightly point your toes inward on your back foot. Keeping your right / left heel on the floor and your knee straight, shift your  weight toward the wall, not allowing your back to arch. You should feel a gentle stretch in the right / left calf. Hold this position for 10 seconds. Repeat 3 times. Complete this stretch 2 times per day.  STRETCH  Soleus, Standing Place hands on wall. Extend right / left leg, keeping the other knee somewhat bent. Slightly point your toes inward on your back foot. Keep your right / left heel on the floor, bend your back knee, and slightly shift your weight over the back leg so that you feel a gentle stretch deep in your back calf. Hold this position for 10 seconds. Repeat 3 times. Complete this stretch 2 times per day.  STRETCH  Gastrocsoleus, Standing  Note: This exercise can place a lot of stress on your foot and ankle. Please complete this exercise only if specifically instructed by your caregiver.  Place the ball of your right / left foot on a step, keeping your other foot firmly on the same step. Hold on to the wall or a rail for balance. Slowly lift your other foot, allowing your body weight to press your heel down over the edge of the step. You should feel a stretch in your right / left calf. Hold this position for 10 seconds. Repeat this exercise with a slight bend in your right / left knee. Repeat 3 times. Complete this stretch 2 times per day.   STRENGTHENING EXERCISES - Plantar Fasciitis (Heel Spur Syndrome)  These exercises may help you when beginning to rehabilitate your   injury. They may resolve your symptoms with or without further involvement from your physician, physical therapist or athletic trainer. While completing these exercises, remember:  Muscles can gain both the endurance and the strength needed for everyday activities through controlled exercises. Complete these exercises as instructed by your physician, physical therapist or athletic trainer. Progress the resistance and repetitions only as guided.  STRENGTH - Towel Curls Sit in a chair positioned on a  non-carpeted surface. Place your foot on a towel, keeping your heel on the floor. Pull the towel toward your heel by only curling your toes. Keep your heel on the floor. Repeat 3 times. Complete this exercise 2 times per day.  STRENGTH - Ankle Inversion Secure one end of a rubber exercise band/tubing to a fixed object (table, pole). Loop the other end around your foot just before your toes. Place your fists between your knees. This will focus your strengthening at your ankle. Slowly, pull your big toe up and in, making sure the band/tubing is positioned to resist the entire motion. Hold this position for 10 seconds. Have your muscles resist the band/tubing as it slowly pulls your foot back to the starting position. Repeat 3 times. Complete this exercises 2 times per day.  Document Released: 03/31/2005 Document Revised: 06/23/2011 Document Reviewed: 07/13/2008 [employer] Patient Information 2014 [employer].  

## 2022-07-01 ENCOUNTER — Other Ambulatory Visit: Payer: Self-pay | Admitting: Family Medicine

## 2022-07-01 DIAGNOSIS — I10 Essential (primary) hypertension: Secondary | ICD-10-CM

## 2022-07-09 ENCOUNTER — Telehealth: Payer: Self-pay

## 2022-07-09 ENCOUNTER — Other Ambulatory Visit: Payer: Self-pay | Admitting: Family Medicine

## 2022-07-09 DIAGNOSIS — E1140 Type 2 diabetes mellitus with diabetic neuropathy, unspecified: Secondary | ICD-10-CM

## 2022-07-09 NOTE — Progress Notes (Signed)
Care Management & Coordination Services Pharmacy Team  Reason for Encounter: Medication coordination and delivery  Contacted patient to discuss medications and coordinate delivery from [facility]. Spoke with patient on 07/09/2022   Cycle dispensing form sent to [facility] for review.   Last adherence delivery date: 06/23/22      Patient is due for next adherence delivery on: 07/22/2022  This delivery to include: Adherence Packaging  30 Days  Metformin 1000 mg- 1 tablet at breakfast and dinner Rosuvastatin 20mg  - take 1 tablet daily with dinner Pantoprazole 40 mg once tablet at bedtime Valsartan-HCTZ 160/25 mg 1 tablet at breakfast Folic acid 1 mg - 1 tablet at breakfast Metoprolol 25 mg - 1 tablet with breakfast and dinner Ozempic 2 mg/dose - Inject 2 mg into the skin once a week Glimepiride 2 mg - 1 tablet before breakfast  Patient declined the following medications this month: None  Confirmed delivery date of 07/22/2022, advised patient that pharmacy will contact them the morning of delivery.  Any concerns about your medications? Patient denies  How often do you forget or accidentally miss a dose? Patient denies  Is patient in packaging Yes  If yes  What is the date on your next pill pack? 07/10/2022  Any concerns or issues with your packaging? Patient denies   Recent blood pressure readings are as follows: Recent blood pressures 129/60, 134/68, 127/61, 125/61  Recent blood glucose readings are as follows: Recent fasting blood sugars 150, 124, 150, 142, 139 and one non fasting 165.   Chart review: Recent office visits:  None  Recent consult visits:  06/26/2022 Lanae Crumbly DPM - Patient was seen for nocturnal foot cramps and hammertoe of left foot. No medication changes.   06/19/2022 Rutherford Guys MD (ophthalmology) - Patient was seen for type 2 diabetes mellitus without complication without long term current use of insulin and an additional concern. No  medication changes.   06/13/2022 Gardiner Barefoot DPM - Patient was seen for Pain due to onychomycosis of toenails of both feet and an additional concern. No medication changes.   Hospital visits:  None  Medications: Outpatient Encounter Medications as of 07/09/2022  Medication Sig   amLODipine (NORVASC) 2.5 MG tablet TAKE 1 TABLET(2.5 MG) BY MOUTH DAILY   aspirin 81 MG tablet Take 81 mg by mouth daily.     cetirizine (ZYRTEC) 10 MG tablet Take 10 mg by mouth daily.   folic acid (FOLVITE) 1 MG tablet Take 1 tablet (1 mg total) by mouth every morning.   glimepiride (AMARYL) 2 MG tablet TAKE ONE TABLET BY MOUTH BEFORE BREAKFAST   glucose blood (ONETOUCH ULTRA) test strip USE TO CHECH BLOOD SUGAR DAILY AND AS NEEDED   Insulin Pen Needle 32G X 4 MM MISC Use as directed   metFORMIN (GLUCOPHAGE) 1000 MG tablet TAKE ONE TABLET BY MOUTH TWICE DAILY   metoprolol tartrate (LOPRESSOR) 25 MG tablet TAKE ONE TABLET BY MOUTH TWICE DAILY   ONETOUCH DELICA LANCETS FINE MISC 1 Stick by Does not apply route daily.   pantoprazole (PROTONIX) 40 MG tablet Take 1 tablet (40 mg total) by mouth daily.   rosuvastatin (CRESTOR) 20 MG tablet Take 1 tablet (20 mg total) by mouth every evening.   Semaglutide, 2 MG/DOSE, (OZEMPIC, 2 MG/DOSE,) 8 MG/3ML SOPN Inject 2 mg into the skin once a week.   valsartan-hydrochlorothiazide (DIOVAN-HCT) 160-25 MG tablet Take 1 tablet by mouth daily.   vitamin B-12 (CYANOCOBALAMIN) 500 MCG tablet Take 500 mcg by mouth daily.  No facility-administered encounter medications on file as of 07/09/2022.   BP Readings from Last 3 Encounters:  04/01/22 120/62  01/28/22 126/60  12/31/21 (!) 140/62    Pulse Readings from Last 3 Encounters:  04/01/22 77  01/28/22 78  12/31/21 79    Lab Results  Component Value Date/Time   HGBA1C 6.3 (A) 04/01/2022 09:36 AM   HGBA1C 6.1 (A) 12/31/2021 09:38 AM   HGBA1C 7.2 (H) 08/12/2021 10:26 AM   HGBA1C 7.0 (H) 02/08/2021 11:38 AM   Lab Results   Component Value Date   CREATININE 0.96 08/12/2021   BUN 15 08/12/2021   GFR 60.96 08/12/2021   GFRNONAA >60 05/12/2018   GFRAA >60 05/12/2018   NA 136 08/12/2021   K 4.6 08/12/2021   CALCIUM 9.7 08/12/2021   CO2 27 08/12/2021   [facility] Pharmacist Assistant [phone]

## 2022-07-16 ENCOUNTER — Other Ambulatory Visit: Payer: Self-pay | Admitting: *Deleted

## 2022-07-16 MED ORDER — ONETOUCH ULTRA VI STRP: ORAL_STRIP | 1 refills | Status: DC

## 2022-07-16 NOTE — Telephone Encounter (Signed)
Rx done. 

## 2022-08-07 ENCOUNTER — Telehealth: Payer: Self-pay

## 2022-08-07 NOTE — Progress Notes (Signed)
Care Management & Coordination Services Pharmacy Team  Reason for Encounter: Medication coordination and delivery  Contacted patient to discuss medications and coordinate delivery from [facility] pharmacy. Spoke with patient on 08/08/2022   Cycle dispensing form sent to [facility] for review.   Last adherence delivery date:07/22/2022      Patient is due for next adherence delivery on: 08/20/2022  This delivery to include: Adherence Packaging  30 Days  Metformin 1000 mg- 1 tablet at breakfast and dinner Rosuvastatin 20mg  - take 1 tablet daily with dinner Pantoprazole 40 mg once tablet at bedtime Valsartan-HCTZ 160/25 mg 1 tablet at breakfast Folic acid 1 mg - 1 tablet at breakfast Metoprolol 25 mg - 1 tablet with breakfast and dinner Ozempic 2 mg/dose - Inject 2 mg into the skin once a week Glimepiride 2 mg - 1 tablet before breakfast  Patient declined the following medications this month: None  Confirmed delivery date of 08/20/2022, advised patient that pharmacy will contact them the morning of delivery.  Any concerns about your medications? Patient denies  How often do you forget or accidentally miss a dose? Patient denies  Is patient in packaging Yes  If yes  What is the date on your next pill pack? 08/08/2022  Any concerns or issues with your packaging? Patient denies  Recent blood pressure readings are as follows: 08/07/20 - 122/62,  08/07/2022 -134/62, 08/06/2022 - 127/61  Recent blood glucose readings are as follows: 08/08/22 -FBS 119,  08/07/22 - FBS 133,  08/06/22 - FBS 135  Current antihyperglycemic regimen:  Metformin 1000 mg- 1 tablet at breakfast and dinner Ozempic 2 mg/dose - Inject 2 mg into the skin once a week Glimepiride 2 mg - 1 tablet before breakfast  Patient verbally confirms she is taking the above medications as directed. Yes  What diet changes have been made to improve diabetes control? No recent changes.   Patient denies hypoglycemic  symptoms, including None  Patient denies hyperglycemic symptoms, including none  How often are you checking your blood sugar? once daily and in the morning before eating or drinking sometimes in the evening.   During the week, how often does your blood glucose drop below 70? Patient denies  Are you checking your feet daily/regularly? Yes  Care Gaps: AWV - completed 01/28/2022 Last eye exam - 06/19/2022 Last foot exam - 09/04/2021 Last BP - 120/62 on 04/02/2023  Last A1C - 6.3 on 04/01/2022 Covid - overdue   Star Rating Drugs: Glimepiride 2 mg - last filled 08/12/2022 30 DS at [facility] Metformin 1000 mg - last filled 08/12/2022  30 DS at [facility] Rosuvastatin 20 mg - last filled 08/12/2022  30 DS at [facility]  Ozempic 2 mg - last filled 08/12/2022  28 DS at [facility]  Valsartan HCTZ 160/25 mg - last filled 08/12/2022 30 DS at [facility]    Chart review: Recent office visits:  None  Recent consult visits:  None  Hospital visits:  None  Medications: Outpatient Encounter Medications as of 08/07/2022  Medication Sig   amLODipine (NORVASC) 2.5 MG tablet TAKE 1 TABLET(2.5 MG) BY MOUTH DAILY   aspirin 81 MG tablet Take 81 mg by mouth daily.     cetirizine (ZYRTEC) 10 MG tablet Take 10 mg by mouth daily.   folic acid (FOLVITE) 1 MG tablet Take 1 tablet (1 mg total) by mouth every morning.   glimepiride (AMARYL) 2 MG tablet TAKE ONE TABLET BY MOUTH BEFORE BREAKFAST   glucose blood (ONETOUCH ULTRA) test strip USE TO  CHECH BLOOD SUGAR DAILY AND AS NEEDED   Insulin Pen Needle 32G X 4 MM MISC Use as directed   metFORMIN (GLUCOPHAGE) 1000 MG tablet TAKE ONE TABLET BY MOUTH TWICE DAILY   metoprolol tartrate (LOPRESSOR) 25 MG tablet TAKE ONE TABLET BY MOUTH TWICE DAILY   ONETOUCH DELICA LANCETS FINE MISC 1 Stick by Does not apply route daily.   pantoprazole (PROTONIX) 40 MG tablet Take 1 tablet (40 mg total) by mouth daily.   rosuvastatin (CRESTOR) 20 MG tablet Take 1 tablet (20 mg total)  by mouth every evening.   Semaglutide, 2 MG/DOSE, (OZEMPIC, 2 MG/DOSE,) 8 MG/3ML SOPN Inject 2 mg into the skin once a week.   valsartan-hydrochlorothiazide (DIOVAN-HCT) 160-25 MG tablet Take 1 tablet by mouth daily.   vitamin B-12 (CYANOCOBALAMIN) 500 MCG tablet Take 500 mcg by mouth daily.    No facility-administered encounter medications on file as of 08/07/2022.   BP Readings from Last 3 Encounters:  04/01/22 120/62  01/28/22 126/60  12/31/21 (!) 140/62    Pulse Readings from Last 3 Encounters:  04/01/22 77  01/28/22 78  12/31/21 79    Lab Results  Component Value Date/Time   HGBA1C 6.3 (A) 04/01/2022 09:36 AM   HGBA1C 6.1 (A) 12/31/2021 09:38 AM   HGBA1C 7.2 (H) 08/12/2021 10:26 AM   HGBA1C 7.0 (H) 02/08/2021 11:38 AM   Lab Results  Component Value Date   CREATININE 0.96 08/12/2021   BUN 15 08/12/2021   GFR 60.96 08/12/2021   GFRNONAA >60 05/12/2018   GFRAA >60 05/12/2018   NA 136 08/12/2021   K 4.6 08/12/2021   CALCIUM 9.7 08/12/2021   CO2 27 08/12/2021   Inetta Fermo CMA  Clinical Pharmacist Assistant [phone]

## 2022-08-18 ENCOUNTER — Other Ambulatory Visit: Payer: Self-pay | Admitting: Family Medicine

## 2022-08-18 DIAGNOSIS — E1140 Type 2 diabetes mellitus with diabetic neuropathy, unspecified: Secondary | ICD-10-CM

## 2022-08-27 ENCOUNTER — Telehealth: Payer: Self-pay | Admitting: *Deleted

## 2022-08-27 NOTE — Telephone Encounter (Signed)
Patient was noted on a list given to me by clinical supervisor for lack of recent mammogram results.  I left a detailed message at the patient's cell number stating we do not have a recent mammogram on file and requested she call back with more information if she has had the test or would like to have this ordered.

## 2022-08-27 NOTE — Telephone Encounter (Signed)
Pt believe she had last mammo at breast center . Pt was given phone number and transferred also to breast center

## 2022-08-29 DIAGNOSIS — Z1231 Encounter for screening mammogram for malignant neoplasm of breast: Secondary | ICD-10-CM | POA: Diagnosis not present

## 2022-08-29 LAB — HM MAMMOGRAPHY

## 2022-09-01 ENCOUNTER — Encounter: Payer: Self-pay | Admitting: Family Medicine

## 2022-09-01 ENCOUNTER — Ambulatory Visit (INDEPENDENT_AMBULATORY_CARE_PROVIDER_SITE_OTHER): Payer: Medicare Other | Admitting: Family Medicine

## 2022-09-01 VITALS — BP 122/62 | HR 88 | Temp 98.1°F | Ht 63.5 in | Wt 143.8 lb

## 2022-09-01 DIAGNOSIS — E559 Vitamin D deficiency, unspecified: Secondary | ICD-10-CM | POA: Diagnosis not present

## 2022-09-01 DIAGNOSIS — R5383 Other fatigue: Secondary | ICD-10-CM

## 2022-09-01 DIAGNOSIS — E538 Deficiency of other specified B group vitamins: Secondary | ICD-10-CM | POA: Diagnosis not present

## 2022-09-01 DIAGNOSIS — E1140 Type 2 diabetes mellitus with diabetic neuropathy, unspecified: Secondary | ICD-10-CM | POA: Diagnosis not present

## 2022-09-01 DIAGNOSIS — Z7984 Long term (current) use of oral hypoglycemic drugs: Secondary | ICD-10-CM

## 2022-09-01 DIAGNOSIS — I10 Essential (primary) hypertension: Secondary | ICD-10-CM | POA: Diagnosis not present

## 2022-09-01 LAB — LIPID PANEL
Cholesterol: 119 mg/dL (ref 0–200)
HDL: 31.5 mg/dL — ABNORMAL LOW (ref >39.00)
NonHDL: 87.41
Total CHOL/HDL Ratio: 4
Triglycerides: 307 mg/dL — ABNORMAL HIGH (ref 0.0–149.0)
VLDL: 61.4 mg/dL — ABNORMAL HIGH (ref 0.0–40.0)

## 2022-09-01 LAB — COMPREHENSIVE METABOLIC PANEL
ALT: 9 U/L (ref 0–35)
AST: 16 U/L (ref 0–37)
Albumin: 4.2 g/dL (ref 3.5–5.2)
Alkaline Phosphatase: 62 U/L (ref 39–117)
BUN: 20 mg/dL (ref 6–23)
CO2: 24 mEq/L (ref 19–32)
Calcium: 9.3 mg/dL (ref 8.4–10.5)
Chloride: 102 mEq/L (ref 96–112)
Creatinine, Ser: 1.03 mg/dL (ref 0.40–1.20)
GFR: 55.61 mL/min — ABNORMAL LOW (ref >60.00)
Glucose, Bld: 132 mg/dL — ABNORMAL HIGH (ref 70–99)
Potassium: 4.3 mEq/L (ref 3.5–5.1)
Sodium: 138 mEq/L (ref 135–145)
Total Bilirubin: 0.3 mg/dL (ref 0.2–1.2)
Total Protein: 7.3 g/dL (ref 6.0–8.3)

## 2022-09-01 LAB — POCT GLYCOSYLATED HEMOGLOBIN (HGB A1C): Hemoglobin A1C: 6.4 % — AB (ref 4.0–5.6)

## 2022-09-01 LAB — LDL CHOLESTEROL, DIRECT: Direct LDL: 53 mg/dL

## 2022-09-01 MED ORDER — VALSARTAN-HYDROCHLOROTHIAZIDE 160-25 MG PO TABS: 1.0000 | ORAL_TABLET | Freq: Every day | ORAL | 1 refills | Status: DC

## 2022-09-01 NOTE — Assessment & Plan Note (Signed)
Needs new level this year.

## 2022-09-01 NOTE — Assessment & Plan Note (Signed)
BP is well controlled on metoprolol 25 mg BID and valsartan-HCT, refilled this for her today. Needs new CMP.

## 2022-09-01 NOTE — Telephone Encounter (Signed)
Noted  

## 2022-09-01 NOTE — Progress Notes (Signed)
Established Patient Office Visit  Subjective   Patient ID: Andrea [name], female    DOB: 1956/02/12  Age: 67 y.o. MRN: [id number]  Chief Complaint  Patient presents with   Medical Management of Chronic Issues    Pt is here for follow up.   DM-- pt reports compliance with her medications, states her blood sugars at home have been stable. She did have her eye exam, no retinopathy. She denies any new symptoms or issues. States she is tolerating the ozempic well, has difficulty with eating enough protein but overall her weight has remained stable. She is due for labs today.  HTN -- BP in office performed and is well controlled. She  reports no side effects to the medications, no chest pain, SOB, dizziness or headaches. She has a BP cuff at home and is checking BP regularly, reports they are in the normal range.     Current Outpatient Medications  Medication Instructions   amLODipine (NORVASC) 2.5 MG tablet TAKE 1 TABLET(2.5 MG) BY MOUTH DAILY   aspirin 81 mg, Oral, Daily   cetirizine (ZYRTEC) 10 mg, Oral, Daily   folic acid (FOLVITE) 1 mg, Oral, Every morning   glimepiride (AMARYL) 2 MG tablet TAKE ONE TABLET BY MOUTH BEFORE BREAKFAST   glucose blood (ONETOUCH ULTRA) test strip USE TO CHECH BLOOD SUGAR DAILY AND AS NEEDED   Insulin Pen Needle 32G X 4 MM MISC Use as directed   metFORMIN (GLUCOPHAGE) 1000 MG tablet TAKE ONE TABLET BY MOUTH TWICE DAILY   metoprolol tartrate (LOPRESSOR) 25 mg, Oral, 2 times daily   ONETOUCH DELICA LANCETS FINE MISC 1 Stick, Does not apply, Daily   OZEMPIC, 2 MG/DOSE, 8 MG/3ML SOPN Inject 2mg  into THE SKIN ONCE WEEKLY   pantoprazole (PROTONIX) 40 MG tablet TAKE ONE TABLET BY MOUTH EVERYDAY AT BEDTIME   rosuvastatin (CRESTOR) 20 mg, Oral, Every evening   valsartan-hydrochlorothiazide (DIOVAN-HCT) 160-25 MG tablet 1 tablet, Oral, Daily   vitamin B-12 (CYANOCOBALAMIN) 500 mcg, Oral, Daily    Patient Active Problem List   Diagnosis Date Noted    Hyperlipidemia associated with type 2 diabetes mellitus (HCC) 09/09/2018   B12 deficiency 09/09/2018   Pseudophakia 05/15/2016   Type 2 diabetes mellitus with diabetic neuropathy, without long-term current use of insulin (HCC) 05/24/2015   Hx of adenomatous colonic polyps 08/09/2014   Osteoarthritis 05/07/2007   HTN (hypertension) 11/19/2006   Coronary atherosclerosis 11/09/2006      Review of Systems  All other systems reviewed and are negative.     Objective:     BP 122/62 (BP Location: Left Arm, Patient Position: Sitting, Cuff Size: Normal)   Pulse 88   Temp 98.1 F (36.7 C) (Oral)   Ht 5' 3.5" (1.613 m)   Wt 143 lb 12.8 oz (65.2 kg)   SpO2 98%   BMI 25.07 kg/m    Physical Exam Vitals reviewed.  Constitutional:      Appearance: Normal appearance. She is well-groomed and normal weight.  Eyes:     Conjunctiva/sclera: Conjunctivae normal.  Neck:     Thyroid: No thyromegaly.  Cardiovascular:     Rate and Rhythm: Normal rate and regular rhythm.     Pulses: Normal pulses.     Heart sounds: S1 normal and S2 normal.  Pulmonary:     Effort: Pulmonary effort is normal.     Breath sounds: Normal breath sounds and air entry.  Musculoskeletal:     Right lower leg: No edema.  Left lower leg: No edema.  Neurological:     Mental Status: She is alert and oriented to person, place, and time. Mental status is at baseline.     Gait: Gait is intact.  Psychiatric:        Mood and Affect: Mood and affect normal.        Speech: Speech normal.        Behavior: Behavior normal.        Judgment: Judgment normal.      Results for orders placed or performed in visit on 09/01/22  POC HgB A1c  Result Value Ref Range   Hemoglobin A1C 6.4 (A) 4.0 - 5.6 %   HbA1c POC (<> result, manual entry)     HbA1c, POC (prediabetic range)     HbA1c, POC (controlled diabetic range)        The ASCVD Risk score (Arnett DK, et al., 2019) failed to calculate for the following reasons:    The valid total cholesterol range is 130 to 320 mg/dL    Assessment & Plan:  Type 2 diabetes mellitus with diabetic neuropathy, without long-term current use of insulin (HCC) Assessment & Plan: A1C performed in office today and is 6.4, very well controlled on glimepiride 2 mg daily, ozempic 2 mg weekly, and metformin 1000 mg BID. Will continue this regimen.   Orders: -     POCT glycosylated hemoglobin (Hb A1C) -     Lipid panel  Primary hypertension Assessment & Plan: BP is well controlled on metoprolol 25 mg BID and valsartan-HCT, refilled this for her today. Needs new CMP.   Orders: -     Comprehensive metabolic panel -     Valsartan-hydroCHLOROthiazide; Take 1 tablet by mouth daily.  Dispense: 90 tablet; Refill: 1  B12 deficiency Assessment & Plan: Needs new level this year.   Orders: -     Vitamin B12  Fatigue, unspecified type -     VITAMIN D 25 Hydroxy (Vit-D Deficiency, Fractures)     Return in about 6 months (around 03/04/2023) for DM.    Karie Georges, MD

## 2022-09-01 NOTE — Assessment & Plan Note (Signed)
A1C performed in office today and is 6.4, very well controlled on glimepiride 2 mg daily, ozempic 2 mg weekly, and metformin 1000 mg BID. Will continue this regimen.

## 2022-09-02 LAB — VITAMIN D 25 HYDROXY (VIT D DEFICIENCY, FRACTURES): VITD: 11.57 ng/mL — ABNORMAL LOW (ref 30.00–100.00)

## 2022-09-02 LAB — VITAMIN B12: Vitamin B-12: 485 pg/mL (ref 211–911)

## 2022-09-03 MED ORDER — VITAMIN D (ERGOCALCIFEROL) 1.25 MG (50000 UNIT) PO CAPS
50000.0000 [IU] | ORAL_CAPSULE | ORAL | 1 refills | Status: DC
Start: 2022-09-03 — End: 2023-09-01

## 2022-09-03 NOTE — Addendum Note (Signed)
Addended by: Karie Georges on: 09/03/2022 02:22 PM   Modules accepted: Orders

## 2022-09-04 ENCOUNTER — Encounter: Payer: Self-pay | Admitting: Family Medicine

## 2022-09-04 NOTE — Telephone Encounter (Signed)
error 

## 2022-09-09 ENCOUNTER — Telehealth: Payer: Self-pay

## 2022-09-09 NOTE — Progress Notes (Unsigned)
Care Management & Coordination Services Pharmacy Team  Reason for Encounter: Medication coordination and delivery  Contacted patient to discuss medications and coordinate delivery from [facility]. {US HC Outreach:28874}  Cycle dispensing form sent to *** for review.   Last adherence delivery date:08/20/2022      Patient is due for next adherence delivery on: 09/19/2022  This delivery to include: Adherence Packaging  30 Days  Metformin 1000 mg- 1 tablet at breakfast and dinner Rosuvastatin 20mg  - take 1 tablet daily with dinner Pantoprazole 40 mg once tablet at bedtime Valsartan-HCTZ 160/25 mg 1 tablet at breakfast Folic acid 1 mg - 1 tablet at breakfast Metoprolol 25 mg - 1 tablet with breakfast and dinner Ozempic 2 mg/dose - Inject 2 mg into the skin once a week Glimepiride 2 mg - 1 tablet before breakfast Vit D 50,000 un - 1 capsule weekly  Patient declined the following medications this month: None   {Delivery date:25786}  Any concerns about your medications? {yes/no:20286}  How often do you forget or accidentally miss a dose? {Missed doses:25554}  Is patient in packaging Yes  If yes  What is the date on your next pill pack?  Any concerns or issues with your packaging?   Recent blood pressure readings are as follows:***  Recent blood glucose readings are as follows:***   Chart review: Recent office visits:  09/01/2022 Nira Conn MD - Patient was seen for Type 2 diabetes mellitus with diabetic neuropathy, without long-term current use of insulin and additional concerns. Started Vit D 50,000 un q week.  Recent consult visits:  [facility] visits:  {Hospital DC Yes/No:21091515}  Medications: Outpatient Encounter Medications as of 09/09/2022  Medication Sig   amLODipine (NORVASC) 2.5 MG tablet TAKE 1 TABLET(2.5 MG) BY MOUTH DAILY   aspirin 81 MG tablet Take 81 mg by mouth daily.     cetirizine (ZYRTEC) 10 MG tablet Take 10 mg by mouth daily.    folic acid (FOLVITE) 1 MG tablet TAKE ONE TABLET BY MOUTH EVERY MORNING   glimepiride (AMARYL) 2 MG tablet TAKE ONE TABLET BY MOUTH BEFORE BREAKFAST   glucose blood (ONETOUCH ULTRA) test strip USE TO CHECH BLOOD SUGAR DAILY AND AS NEEDED   Insulin Pen Needle 32G X 4 MM MISC Use as directed   metFORMIN (GLUCOPHAGE) 1000 MG tablet TAKE ONE TABLET BY MOUTH TWICE DAILY   metoprolol tartrate (LOPRESSOR) 25 MG tablet TAKE ONE TABLET BY MOUTH TWICE DAILY   ONETOUCH DELICA LANCETS FINE MISC 1 Stick by Does not apply route daily.   OZEMPIC, 2 MG/DOSE, 8 MG/3ML SOPN Inject 2mg  into THE SKIN ONCE WEEKLY   pantoprazole (PROTONIX) 40 MG tablet TAKE ONE TABLET BY MOUTH EVERYDAY AT BEDTIME   rosuvastatin (CRESTOR) 20 MG tablet TAKE ONE TABLET BY MOUTH EVERY EVENING   valsartan-hydrochlorothiazide (DIOVAN-HCT) 160-25 MG tablet Take 1 tablet by mouth daily.   vitamin B-12 (CYANOCOBALAMIN) 500 MCG tablet Take 500 mcg by mouth daily.    Vitamin D, Ergocalciferol, (DRISDOL) 1.25 MG (50000 UNIT) CAPS capsule Take 1 capsule (50,000 Units total) by mouth every 7 (seven) days.   No facility-administered encounter medications on file as of 09/09/2022.   BP Readings from Last 3 Encounters:  09/01/22 122/62  04/01/22 120/62  01/28/22 126/60    Pulse Readings from Last 3 Encounters:  09/01/22 88  04/01/22 77  01/28/22 78    Lab Results  Component Value Date/Time   HGBA1C 6.4 (A) 09/01/2022 08:42 AM   HGBA1C 6.3 (A)  04/01/2022 09:36 AM   HGBA1C 7.2 (H) 08/12/2021 10:26 AM   HGBA1C 7.0 (H) 02/08/2021 11:38 AM   Lab Results  Component Value Date   CREATININE 1.03 09/01/2022   BUN 20 09/01/2022   GFR 55.61 (L) 09/01/2022   GFRNONAA >60 05/12/2018   GFRAA >60 05/12/2018   NA 138 09/01/2022   K 4.3 09/01/2022   CALCIUM 9.3 09/01/2022   CO2 24 09/01/2022    Inetta Fermo CMA  Clinical Pharmacist Assistant [phone]

## 2022-10-03 ENCOUNTER — Ambulatory Visit (INDEPENDENT_AMBULATORY_CARE_PROVIDER_SITE_OTHER): Payer: Medicare Other | Admitting: Family Medicine

## 2022-10-03 ENCOUNTER — Encounter: Payer: Self-pay | Admitting: Family Medicine

## 2022-10-03 VITALS — BP 104/44 | HR 69 | Temp 97.9°F | Ht 63.5 in | Wt 140.0 lb

## 2022-10-03 DIAGNOSIS — J209 Acute bronchitis, unspecified: Secondary | ICD-10-CM

## 2022-10-03 MED ORDER — PREDNISONE 20 MG PO TABS
ORAL_TABLET | ORAL | 0 refills | Status: AC
Start: 2022-10-03 — End: 2022-10-11

## 2022-10-03 MED ORDER — ALBUTEROL SULFATE HFA 108 (90 BASE) MCG/ACT IN AERS
2.0000 | INHALATION_SPRAY | Freq: Four times a day (QID) | RESPIRATORY_TRACT | 0 refills | Status: AC | PRN
Start: 2022-10-03 — End: ?

## 2022-10-03 MED ORDER — DOXYCYCLINE HYCLATE 100 MG PO TABS
100.0000 mg | ORAL_TABLET | Freq: Two times a day (BID) | ORAL | 0 refills | Status: AC
Start: 2022-10-03 — End: 2022-10-10

## 2022-10-03 NOTE — Progress Notes (Signed)
Acute Office Visit  Subjective:     Patient ID: Andrea [name], female    DOB: April 23, 1955, 67 y.o.   MRN: [id number]  Chief Complaint  Patient presents with   Cough    Pt reports she tested for covid on Friday, September 25, 2024. Negative Sx started on Thursday with dry cough. Coughing up clear phlegm. Have chest congestion. Took Delsym. Low grade fever, bodyache. SOB   Fatigue   Diarrhea    Sx on Thursday. Last episode was yesterday morning.    Patient is reporting cough and SOB for the past week. Low grade fevers at home, states her grandson has been coughing at home but other than that no sick contacts. Pt reports some body aches, then had an episode of diarrhea. Initially she didn't have a productive cough but now she is having a lot more congestion in the past couple of days.     Review of Systems  All other systems reviewed and are negative.       Objective:    BP (!) 104/44 (BP Location: Left Arm, Patient Position: Sitting, Cuff Size: Normal)   Pulse 69   Temp 97.9 F (36.6 C) (Oral)   Ht 5' 3.5" (1.613 m)   Wt 140 lb (63.5 kg)   SpO2 94%   BMI 24.41 kg/m    Physical Exam Vitals reviewed.  Constitutional:      Appearance: Normal appearance. She is well-groomed and normal weight.  HENT:     Right Ear: Tympanic membrane normal.     Left Ear: Tympanic membrane normal.     Mouth/Throat:     Mouth: Mucous membranes are moist.     Pharynx: No posterior oropharyngeal erythema.  Eyes:     Conjunctiva/sclera: Conjunctivae normal.  Neck:     Thyroid: No thyromegaly.  Cardiovascular:     Rate and Rhythm: Normal rate and regular rhythm.     Pulses: Normal pulses.     Heart sounds: S1 normal and S2 normal.  Pulmonary:     Effort: Pulmonary effort is normal.     Breath sounds: Normal air entry. Wheezing and rhonchi present.  Musculoskeletal:     Right lower leg: No edema.     Left lower leg: No edema.  Neurological:     Mental Status: She is alert and oriented  to person, place, and time. Mental status is at baseline.     Gait: Gait is intact.  Psychiatric:        Mood and Affect: Mood and affect normal.        Speech: Speech normal.        Behavior: Behavior normal.        Judgment: Judgment normal.     No results found for any visits on 10/03/22.      Assessment & Plan:   Problem List Items Addressed This Visit   None Visit Diagnoses     Acute bronchitis, unspecified organism    -  Primary   Relevant Medications   doxycycline (VIBRA-TABS) 100 MG tablet   predniSONE (DELTASONE) 20 MG tablet   albuterol (VENTOLIN HFA) 108 (90 Base) MCG/ACT inhaler     Patient is a former smoker, used to smoke 1 ppd for 30 years, quit about 12 years ago, CXR from 2020 showed hyperinflation and her breath sounds are somewhat distant. I worry that she has some underlying COPD, however the management will remain the same. Will treat with inhaler, steroid taper and doxycycline. RTC  PRN.  Meds ordered this encounter  Medications   doxycycline (VIBRA-TABS) 100 MG tablet    Sig: Take 1 tablet (100 mg total) by mouth 2 (two) times daily for 7 days.    Dispense:  14 tablet    Refill:  0   predniSONE (DELTASONE) 20 MG tablet    Sig: Take 3 tablets (60 mg total) by mouth daily with breakfast for 2 days, THEN 2 tablets (40 mg total) daily with breakfast for 2 days, THEN 1 tablet (20 mg total) daily with breakfast for 2 days, THEN 0.5 tablets (10 mg total) daily with breakfast for 2 days.    Dispense:  13 tablet    Refill:  0   albuterol (VENTOLIN HFA) 108 (90 Base) MCG/ACT inhaler    Sig: Inhale 2 puffs into the lungs every 6 (six) hours as needed for wheezing or shortness of breath.    Dispense:  6.7 g    Refill:  0    No follow-ups on file.  Karie Georges, MD

## 2022-10-05 ENCOUNTER — Other Ambulatory Visit: Payer: Self-pay | Admitting: Family Medicine

## 2022-10-05 DIAGNOSIS — E1140 Type 2 diabetes mellitus with diabetic neuropathy, unspecified: Secondary | ICD-10-CM

## 2022-10-13 ENCOUNTER — Ambulatory Visit: Payer: Medicare Other | Admitting: Podiatry

## 2022-12-05 ENCOUNTER — Other Ambulatory Visit: Payer: Self-pay | Admitting: Family Medicine

## 2022-12-05 DIAGNOSIS — E1140 Type 2 diabetes mellitus with diabetic neuropathy, unspecified: Secondary | ICD-10-CM

## 2023-01-01 ENCOUNTER — Other Ambulatory Visit: Payer: Self-pay | Admitting: Family Medicine

## 2023-01-01 DIAGNOSIS — E1140 Type 2 diabetes mellitus with diabetic neuropathy, unspecified: Secondary | ICD-10-CM

## 2023-01-01 MED ORDER — ROSUVASTATIN CALCIUM 20 MG PO TABS: 20.0000 mg | ORAL_TABLET | Freq: Every evening | ORAL | 1 refills | Status: DC

## 2023-01-01 MED ORDER — FOLIC ACID 1 MG PO TABS: 1.0000 mg | ORAL_TABLET | Freq: Every morning | ORAL | 1 refills | Status: DC

## 2023-01-01 MED ORDER — GLIMEPIRIDE 2 MG PO TABS
ORAL_TABLET | ORAL | 1 refills | Status: DC
Start: 2023-01-01 — End: 2023-07-01

## 2023-01-01 MED ORDER — PANTOPRAZOLE SODIUM 40 MG PO TBEC: 40.0000 mg | DELAYED_RELEASE_TABLET | Freq: Every day | ORAL | 1 refills | Status: DC

## 2023-01-01 NOTE — Addendum Note (Signed)
Addended by: Johnella Moloney on: 01/01/2023 11:16 AM   Modules accepted: Orders

## 2023-01-01 NOTE — Telephone Encounter (Signed)
Rx done. 

## 2023-01-02 ENCOUNTER — Other Ambulatory Visit: Payer: Self-pay | Admitting: Family Medicine

## 2023-01-02 DIAGNOSIS — E1140 Type 2 diabetes mellitus with diabetic neuropathy, unspecified: Secondary | ICD-10-CM

## 2023-01-02 DIAGNOSIS — I10 Essential (primary) hypertension: Secondary | ICD-10-CM

## 2023-01-29 ENCOUNTER — Other Ambulatory Visit: Payer: Self-pay | Admitting: Family Medicine

## 2023-01-29 DIAGNOSIS — I10 Essential (primary) hypertension: Secondary | ICD-10-CM

## 2023-02-11 ENCOUNTER — Ambulatory Visit: Payer: Medicare Other

## 2023-02-11 VITALS — BP 122/60 | HR 68 | Temp 98.2°F | Ht 63.5 in | Wt 144.7 lb

## 2023-02-11 DIAGNOSIS — Z23 Encounter for immunization: Secondary | ICD-10-CM

## 2023-02-11 DIAGNOSIS — Z0000 Encounter for general adult medical examination without abnormal findings: Secondary | ICD-10-CM

## 2023-02-11 NOTE — Progress Notes (Signed)
Subjective:   JENNEAN GUILLET is a 67 y.o. female who presents for Medicare Annual (Subsequent) preventive examination.  Visit Complete: In person   Cardiac Risk Factors include: advanced age (>48men, >58 women);diabetes mellitus;hypertension     Objective:    Today's Vitals   02/11/23 0916  BP: 122/60  Pulse: 68  Temp: 98.2 F (36.8 C)  TempSrc: Oral  SpO2: 93%  Weight: 144 lb 11.2 oz (65.6 kg)  Height: 5' 3.5" (1.613 m)   Body mass index is 25.23 kg/m.     02/11/2023    9:43 AM 01/28/2022    9:32 AM 01/15/2021    9:42 AM 01/10/2020    8:08 AM 08/01/2014    8:41 AM  Advanced Directives  Does Patient Have a Medical Advance Directive? No No No No No  Would patient like information on creating a medical advance directive? No - Patient declined No - Patient declined Yes (MAU/Ambulatory/Procedural Areas - Information given) Yes (MAU/Ambulatory/Procedural Areas - Information given)     Current Medications (verified) Outpatient Encounter Medications as of 02/11/2023  Medication Sig   albuterol (VENTOLIN HFA) 108 (90 Base) MCG/ACT inhaler Inhale 2 puffs into the lungs every 6 (six) hours as needed for wheezing or shortness of breath.   amLODipine (NORVASC) 2.5 MG tablet TAKE 1 TABLET(2.5 MG) BY MOUTH DAILY   aspirin 81 MG tablet Take 81 mg by mouth daily.     cetirizine (ZYRTEC) 10 MG tablet Take 10 mg by mouth daily.   folic acid (FOLVITE) 1 MG tablet Take 1 tablet (1 mg total) by mouth every morning.   glimepiride (AMARYL) 2 MG tablet TAKE ONE TABLET BY MOUTH BEFORE BREAKFAST   glucose blood (ONETOUCH ULTRA) test strip USE TO CHECH BLOOD SUGAR DAILY AND AS NEEDED   Insulin Pen Needle 32G X 4 MM MISC Use as directed   metFORMIN (GLUCOPHAGE) 1000 MG tablet TAKE 1 TABLET BY MOUTH TWICE DAILY   metoprolol tartrate (LOPRESSOR) 25 MG tablet TAKE 1 TABLET BY MOUTH TWICE DAILY   ONETOUCH DELICA LANCETS FINE MISC 1 Stick by Does not apply route daily.   pantoprazole (PROTONIX) 40  MG tablet Take 1 tablet (40 mg total) by mouth daily.   rosuvastatin (CRESTOR) 20 MG tablet Take 1 tablet (20 mg total) by mouth every evening.   Semaglutide, 2 MG/DOSE, (OZEMPIC, 2 MG/DOSE,) 8 MG/3ML SOPN INJECT 2MG  SUBCUTANEOUSLY ONCE A WEEK AS DIRECTED   valsartan-hydrochlorothiazide (DIOVAN-HCT) 160-25 MG tablet Take 1 tablet by mouth daily.   vitamin B-12 (CYANOCOBALAMIN) 500 MCG tablet Take 500 mcg by mouth daily.    Vitamin D, Ergocalciferol, (DRISDOL) 1.25 MG (50000 UNIT) CAPS capsule Take 1 capsule (50,000 Units total) by mouth every 7 (seven) days.   No facility-administered encounter medications on file as of 02/11/2023.    Allergies (verified) Plavix [clopidogrel bisulfate]   History: Past Medical History:  Diagnosis Date   Allergy    Anemia    Arthritis    B12 deficiency 09/09/2018   CAD (coronary artery disease)    has 3 stents   Cataract    removed both eyes    Diabetes mellitus    Hx of adenomatous colonic polyps 08/09/2014   Hyperlipidemia    Hypertension    Low back pain    Past Surgical History:  Procedure Laterality Date   BREAST LUMPECTOMY     carpal tunnel release both hands      CESAREAN SECTION     2 times   COLONOSCOPY  x 2- hx colon polyps-    EYE SURGERY     lens replaced/02/06/15 and 03/09/15   knee surgery for torn cartillige     right knee   LASIK  01/12/2014, 03/14/2014   POLYPECTOMY     rotator cuff surgery     right side   stents  2000   stents 2001  2001   3 stents   triger finger repair     both hands, right hand done twice   TUBAL LIGATION     Family History  Problem Relation Age of Onset   Heart disease Mother    Hypertension Mother    Heart disease Father    Heart disease Sister    Other Sister        tumor on diaphragm; oxygen depdt   CAD Brother        stenting   Pancreatic cancer Paternal Uncle    Other Cousin        sepsis   Colitis Neg Hx    Colon cancer Neg Hx    Esophageal cancer Neg Hx    Rectal cancer  Neg Hx    Stomach cancer Neg Hx    Social History   Socioeconomic History   Marital status: Divorced    Spouse name: Not on file   Number of children: Not on file   Years of education: Not on file   Highest education level: Not on file  Occupational History   Occupation: Retired  Tobacco Use   Smoking status: Former    Types: E-cigarettes    Quit date: 06/20/2010    Years since quitting: 12.6   Smokeless tobacco: Never   Tobacco comments:    smoked 1ppd for> 30 years/smokes occasionally,some vapor cigarettes  Vaping Use   Vaping status: Former  Substance and Sexual Activity   Alcohol use: No    Alcohol/week: 0.0 standard drinks of alcohol   Drug use: No   Sexual activity: Not Currently  Other Topics Concern   Not on file  Social History Narrative   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      Home Situation: lives with son and grandson      Spiritual Beliefs: none      Lifestyle: no regular exercise; diet not great      Social Determinants of Health   Financial Resource Strain: Low Risk  (02/11/2023)   Overall Financial Resource Strain (CARDIA)    Difficulty of Paying Living Expenses: Not hard at all  Food Insecurity: No Food Insecurity (02/11/2023)   Hunger Vital Sign    Worried About Running Out of Food in the Last Year: Never true    Ran Out of Food in the Last Year: Never true  Transportation Needs: No Transportation Needs (02/11/2023)   PRAPARE - Administrator, Civil Service (Medical): No    Lack of Transportation (Non-Medical): No  Physical Activity: Insufficiently Active (02/11/2023)   Exercise Vital Sign    Days of Exercise per Week: 3 days    Minutes of Exercise per Session: 20 min  Stress: No Stress Concern Present (02/11/2023)   [employer] of Occupational Health - Occupational Stress Questionnaire    Feeling of Stress : Not at all  Social Connections: Socially Isolated (02/11/2023)   Social Connection and Isolation Panel [NHANES]     Frequency of Communication with Friends and Family: More than three times a week    Frequency of Social Gatherings with Friends and Family:  More than three times a week    Attends Religious Services: Never    Active Member of Clubs or Organizations: No    Attends Banker Meetings: Never    Marital Status: Divorced    Tobacco Counseling Counseling given: Not Answered Tobacco comments: smoked 1ppd for> 30 years/smokes occasionally,some vapor cigarettes   Clinical Intake:  Pre-visit preparation completed: Yes  Pain : No/denies pain     BMI - recorded: 25.23 Nutritional Status: BMI 25 -29 Overweight Nutritional Risks: None Diabetes: Yes CBG done?: Yes (CBG 167 Per patient) CBG resulted in Enter/ Edit results?: Yes Did pt. bring in CBG monitor from home?: No  How often do you need to have someone help you when you read instructions, pamphlets, or other written materials from your doctor or pharmacy?: 1 - Never  Interpreter Needed?: No  Information entered by :: Theresa Mulligan LPN   Activities of Daily Living    02/11/2023    9:42 AM  In your present state of health, do you have any difficulty performing the following activities:  Hearing? 0  Vision? 0  Difficulty concentrating or making decisions? 0  Walking or climbing stairs? 0  Dressing or bathing? 0  Doing errands, shopping? 0  Preparing Food and eating ? N  Using the Toilet? N  In the past six months, have you accidently leaked urine? N  Do you have problems with loss of bowel control? N  Managing your Medications? N  Managing your Finances? N  Housekeeping or managing your Housekeeping? N    Patient Care Team: Karie Georges, MD as PCP - General (Family Medicine) Jethro Bolus, MD as Consulting Physician (Ophthalmology) Verner Chol, [facility] (Inactive) as Pharmacist (Pharmacist)  Indicate any recent Medical Services you may have received from other than [facility] providers in the past  year (date may be approximate).     Assessment:   This is a routine wellness examination for Traniyah.  Hearing/Vision screen Hearing Screening - Comments:: Denies hearing difficulties   Vision Screening - Comments::  - up to date with routine eye exams with  Dr Nile Riggs   Goals Addressed               This Visit's Progress     Stay Active! (pt-stated)         Depression Screen    02/11/2023    9:41 AM 01/28/2022    9:33 AM 12/31/2021   10:03 AM 08/12/2021    9:24 AM 05/20/2021    9:50 AM 01/15/2021    9:41 AM 01/10/2020    8:07 AM  PHQ 2/9 Scores  PHQ - 2 Score 0 0 0 0 2 0 0  PHQ- 9 Score   4 0 10      Fall Risk    02/11/2023    9:43 AM 09/01/2022    8:35 AM 01/28/2022    9:33 AM 12/31/2021   10:03 AM 01/15/2021    9:44 AM  Fall Risk   Falls in the past year? 0 0 0 0 0  Number falls in past yr: 0 0 0 0 0  Injury with Fall? 0 0 0 0 0  Risk for fall due to : No Fall Risks No Fall Risks Medication side effect No Fall Risks Impaired vision  Follow up Falls prevention discussed Falls evaluation completed Falls prevention discussed;Education provided;Falls evaluation completed Falls evaluation completed Falls prevention discussed    MEDICARE RISK AT HOME: Medicare Risk at Home Any stairs  in or around the home?: Yes If so, are there any without handrails?: No Home free of loose throw rugs in walkways, pet beds, electrical cords, etc?: Yes Adequate lighting in your home to reduce risk of falls?: Yes Life alert?: No Use of a cane, walker or w/c?: No Grab bars in the bathroom?: No Shower chair or bench in shower?: No Elevated toilet seat or a handicapped toilet?: No  TIMED UP AND GO:  Was the test performed?  Yes  Length of time to ambulate 10 feet: 10 sec Gait steady and fast without use of assistive device    Cognitive Function:        02/11/2023    9:44 AM 01/28/2022    9:35 AM 01/15/2021    9:47 AM 01/10/2020    8:13 AM  6CIT Screen  What Year? 0 points 0  points 0 points 0 points  What month? 0 points 0 points 0 points 0 points  What time? 0 points 0 points 0 points   Count back from 20 0 points 0 points 0 points 0 points  Months in reverse 0 points 0 points 0 points 0 points  Repeat phrase 0 points 0 points 0 points 6 points  Total Score 0 points 0 points 0 points     Immunizations Immunization History  Administered Date(s) Administered   Fluad Quad(high Dose 65+) 12/21/2019, 02/06/2021, 12/31/2021   Fluad Trivalent(High Dose 65+) 02/11/2023   Influenza Whole 01/12/1997, 01/05/2009   Influenza,inj,Quad PF,6+ Mos 01/12/2015, 12/27/2015, 03/09/2017, 12/01/2018   Influenza-Unspecified 02/12/2014, 01/19/2018   PFIZER(Purple Top)SARS-COV-2 Vaccination 07/07/2019, 08/01/2019, 02/06/2021   Pneumococcal Conjugate-13 12/01/2018   Pneumococcal Polysaccharide-23 04/24/2008, 01/31/2014, 12/21/2019   Td 11/13/1997, 04/27/2009   Tdap 05/20/2020   Zoster Recombinant(Shingrix) 08/22/2017, 11/12/2017, 01/12/2018    TDAP status: Up to date  Flu Vaccine status: Completed at today's visit  Pneumococcal vaccine status: Up to date  Covid-19 vaccine status: Declined, Education has been provided regarding the importance of this vaccine but patient still declined. Advised may receive this vaccine at local pharmacy or Health Dept.or vaccine clinic. Aware to provide a copy of the vaccination record if obtained from local pharmacy or Health Dept. Verbalized acceptance and understanding.  Qualifies for Shingles Vaccine? Yes   Zostavax completed Yes   Shingrix Completed?: Yes  Screening Tests Health Maintenance  Topic Date Due   FOOT EXAM  09/05/2022   COVID-19 Vaccine (4 - 2023-24 season) 12/14/2022   Colonoscopy  04/30/2023   HEMOGLOBIN A1C  03/04/2023   Diabetic kidney evaluation - Urine ACR  04/02/2023   OPHTHALMOLOGY EXAM  06/19/2023   Diabetic kidney evaluation - eGFR measurement  09/01/2023   Medicare Annual Wellness (AWV)  02/11/2024    MAMMOGRAM  08/28/2024   DTaP/Tdap/Td (4 - Td or Tdap) 05/20/2030   Pneumonia Vaccine 11+ Years old  Completed   INFLUENZA VACCINE  Completed   DEXA SCAN  Completed   Hepatitis C Screening  Completed   Zoster Vaccines- Shingrix  Completed   HPV VACCINES  Aged Out    Health Maintenance  Health Maintenance Due  Topic Date Due   FOOT EXAM  09/05/2022   COVID-19 Vaccine (4 - 2023-24 season) 12/14/2022   Colonoscopy  04/30/2023    Colorectal cancer screening: Type of screening: Colonoscopy. Completed 04/29/18. Repeat every 5 years  Mammogram status: Completed 09/01/22. Repeat every year  Bone Density status: Completed 06/20/20. Results reflect: Bone density results: NORMAL. Repeat every   years.    Additional Screening:  Hepatitis C Screening: does qualify; Completed 11/23/14  Vision Screening: Recommended annual ophthalmology exams for early detection of glaucoma and other disorders of the eye. Is the patient up to date with their annual eye exam?  Yes  Who is the provider or what is the name of the office in which the patient attends annual eye exams? Dr Nile Riggs If pt is not established with a provider, would they like to be referred to a provider to establish care? No .   Dental Screening: Recommended annual dental exams for proper oral hygiene  Diabetic Foot Exam: Diabetic Foot Exam: Overdue, Pt has been advised about the importance in completing this exam. Pt is scheduled for diabetic foot exam on Followed  by PCP.  Community Resource Referral / Chronic Care Management:  CRR required this visit?  No   CCM required this visit?  No     Plan:     I have personally reviewed and noted the following in the patient's chart:   Medical and social history Use of alcohol, tobacco or illicit drugs  Current medications and supplements including opioid prescriptions. Patient is not currently taking opioid prescriptions. Functional ability and status Nutritional status Physical  activity Advanced directives List of other physicians Hospitalizations, surgeries, and ER visits in previous 12 months Vitals Screenings to include cognitive, depression, and falls Referrals and appointments  In addition, I have reviewed and discussed with patient certain preventive protocols, quality metrics, and best practice recommendations. A written personalized care plan for preventive services as well as general preventive health recommendations were provided to patient.     Tillie Rung, LPN   [id number]   After Visit Summary: Given  Nurse Notes: None

## 2023-02-11 NOTE — Patient Instructions (Addendum)
Andrea [name] , Thank you for taking time to come for your Medicare Wellness Visit. I appreciate your ongoing commitment to your health goals. Please review the following plan we discussed and let me know if I can assist you in the future.   Referrals/Orders/Follow-Ups/Clinician Recommendations:   This is a list of the screening recommended for you and due dates:  Health Maintenance  Topic Date Due   Complete foot exam   09/05/2022   COVID-19 Vaccine (4 - 2023-24 season) 12/14/2022   Colon Cancer Screening  04/30/2023   Hemoglobin A1C  03/04/2023   Yearly kidney health urinalysis for diabetes  04/02/2023   Eye exam for diabetics  06/19/2023   Yearly kidney function blood test for diabetes  09/01/2023   Medicare Annual Wellness Visit  02/11/2024   Mammogram  08/28/2024   DTaP/Tdap/Td vaccine (4 - Td or Tdap) 05/20/2030   Pneumonia Vaccine  Completed   Flu Shot  Completed   DEXA scan (bone density measurement)  Completed   Hepatitis C Screening  Completed   Zoster (Shingles) Vaccine  Completed   HPV Vaccine  Aged Out    Advanced directives: (Provided) Advance directive discussed with you today. I have provided a copy for you to complete at home and have notarized. Once this is complete, please bring a copy in to our office so we can scan it into your chart.   Next Medicare Annual Wellness Visit scheduled for next year: Yes

## 2023-02-13 ENCOUNTER — Telehealth: Payer: Self-pay | Admitting: *Deleted

## 2023-02-13 DIAGNOSIS — I10 Essential (primary) hypertension: Secondary | ICD-10-CM

## 2023-02-13 MED ORDER — VALSARTAN-HYDROCHLOROTHIAZIDE 160-25 MG PO TABS: 1.0000 | ORAL_TABLET | Freq: Every day | ORAL | 1 refills | Status: DC

## 2023-02-13 NOTE — Telephone Encounter (Signed)
Rx done. 

## 2023-02-17 ENCOUNTER — Other Ambulatory Visit: Payer: Self-pay | Admitting: Family Medicine

## 2023-02-17 DIAGNOSIS — I10 Essential (primary) hypertension: Secondary | ICD-10-CM

## 2023-02-27 ENCOUNTER — Other Ambulatory Visit: Payer: Self-pay | Admitting: Family Medicine

## 2023-03-04 ENCOUNTER — Encounter: Payer: Self-pay | Admitting: Family Medicine

## 2023-03-04 ENCOUNTER — Ambulatory Visit (INDEPENDENT_AMBULATORY_CARE_PROVIDER_SITE_OTHER): Payer: Medicare Other | Admitting: Family Medicine

## 2023-03-04 ENCOUNTER — Telehealth: Payer: Self-pay

## 2023-03-04 VITALS — BP 142/60 | HR 77 | Temp 98.2°F | Ht 63.5 in | Wt 144.4 lb

## 2023-03-04 DIAGNOSIS — E559 Vitamin D deficiency, unspecified: Secondary | ICD-10-CM

## 2023-03-04 DIAGNOSIS — E1140 Type 2 diabetes mellitus with diabetic neuropathy, unspecified: Secondary | ICD-10-CM | POA: Diagnosis not present

## 2023-03-04 DIAGNOSIS — Z7984 Long term (current) use of oral hypoglycemic drugs: Secondary | ICD-10-CM

## 2023-03-04 DIAGNOSIS — I10 Essential (primary) hypertension: Secondary | ICD-10-CM

## 2023-03-04 LAB — POCT GLYCOSYLATED HEMOGLOBIN (HGB A1C): Hemoglobin A1C: 6.1 % — AB (ref 4.0–5.6)

## 2023-03-04 LAB — MICROALBUMIN / CREATININE URINE RATIO
Creatinine,U: 73.5 mg/dL
Microalb Creat Ratio: 1 mg/g (ref 0.0–30.0)
Microalb, Ur: 0.7 mg/dL (ref 0.0–1.9)

## 2023-03-04 LAB — VITAMIN D 25 HYDROXY (VIT D DEFICIENCY, FRACTURES): VITD: 103.23 ng/mL (ref 30.00–100.00)

## 2023-03-04 NOTE — Telephone Encounter (Signed)
CRITICAL VALUE STICKER  CRITICAL VALUE: Vitamin D elevated 103.23   RECEIVER (on-site recipient of call): Waymon Amato , CMA  DATE & TIME NOTIFIED: 03/04/2023 @ 3:47pm  MESSENGER (representative from lab):[email or web address]

## 2023-03-04 NOTE — Telephone Encounter (Signed)
Spoke to [place] verbally and he advised pt to stop taking Vit D and wait for Dr.Michaels advise tomorrow. Pt verbalized understanding.

## 2023-03-04 NOTE — Progress Notes (Signed)
Established Patient Office Visit  Subjective   Patient ID: Andrea [name], female    DOB: 04-Jul-1955  Age: 67 y.o. MRN: [id number]  Chief Complaint  Patient presents with   Medical Management of Chronic Issues    Pt is here for follow up of her diabetes and HTN. She reports she is feeling well, no new symptoms or issues to report.   DM- is currently on metofrmin 1000 mg BID and semaglutide 2 mg weekly. A1C is performed in office and is well controlled. She denies any vision changes, no weight loss or gain. Reports good appetite, no nausea or abdominal pain,   HTN -- BP in office performed and is slightly above goal. She reports she has not yet taken her BP medication today. She reports no side effects to the medications, no chest pain, SOB, dizziness or headaches. She has a BP cuff at home and is checking BP regularly, reports they are in the normal range.        Current Outpatient Medications  Medication Instructions   albuterol (VENTOLIN HFA) 108 (90 Base) MCG/ACT inhaler 2 puffs, Inhalation, Every 6 hours PRN   amLODipine (NORVASC) 2.5 MG tablet TAKE 1 TABLET(2.5 MG) BY MOUTH DAILY   aspirin 81 mg, Oral, Daily   cetirizine (ZYRTEC) 10 mg, Oral, Daily   folic acid (FOLVITE) 1 mg, Oral, Every morning   glimepiride (AMARYL) 2 MG tablet TAKE ONE TABLET BY MOUTH BEFORE BREAKFAST   glucose blood (ONETOUCH ULTRA) test strip USE TO TEST BLOOD SUGAR DAILY AND AS NEEDED   Insulin Pen Needle 32G X 4 MM MISC Use as directed   metFORMIN (GLUCOPHAGE) 1000 MG tablet TAKE 1 TABLET BY MOUTH TWICE DAILY   metoprolol tartrate (LOPRESSOR) 25 mg, Oral, 2 times daily   ONETOUCH DELICA LANCETS FINE MISC 1 Stick, Does not apply, Daily   pantoprazole (PROTONIX) 40 mg, Oral, Daily   rosuvastatin (CRESTOR) 20 mg, Oral, Every evening   Semaglutide, 2 MG/DOSE, (OZEMPIC, 2 MG/DOSE,) 8 MG/3ML SOPN INJECT 2MG  SUBCUTANEOUSLY ONCE A WEEK AS DIRECTED   valsartan-hydrochlorothiazide (DIOVAN-HCT) 160-25 MG  tablet 1 tablet, Oral, Daily   vitamin B-12 (CYANOCOBALAMIN) 500 mcg, Oral, Daily   Vitamin D (Ergocalciferol) (DRISDOL) 50,000 Units, Oral, Every 7 days    Patient Active Problem List   Diagnosis Date Noted   Hyperlipidemia associated with type 2 diabetes mellitus (HCC) 09/09/2018   B12 deficiency 09/09/2018   Pseudophakia 05/15/2016   Type 2 diabetes mellitus with diabetic neuropathy, without long-term current use of insulin (HCC) 05/24/2015   Hx of adenomatous colonic polyps 08/09/2014   Osteoarthritis 05/07/2007   HTN (hypertension) 11/19/2006   Coronary atherosclerosis 11/09/2006      Review of Systems  All other systems reviewed and are negative.     Objective:     BP (!) 142/60 (BP Location: Right Arm, Patient Position: Sitting, Cuff Size: Normal)   Pulse 77   Temp 98.2 F (36.8 C) (Oral)   Ht 5' 3.5" (1.613 m)   Wt 144 lb 6.4 oz (65.5 kg)   SpO2 95%   BMI 25.18 kg/m    Physical Exam Vitals reviewed.  Constitutional:      Appearance: Normal appearance. She is well-groomed and normal weight.  Eyes:     Conjunctiva/sclera: Conjunctivae normal.  Cardiovascular:     Rate and Rhythm: Normal rate and regular rhythm.     Pulses: Normal pulses.     Heart sounds: S1 normal and S2 normal.  Pulmonary:  Effort: Pulmonary effort is normal.     Breath sounds: Normal air entry.  Neurological:     Mental Status: She is alert and oriented to person, place, and time. Mental status is at baseline.     Gait: Gait is intact.  Psychiatric:        Mood and Affect: Mood and affect normal.        Speech: Speech normal.        Behavior: Behavior normal.        Judgment: Judgment normal.    Last hemoglobin A1c Lab Results  Component Value Date   HGBA1C 6.1 (A) 03/04/2023      The ASCVD Risk score (Arnett DK, et al., 2019) failed to calculate for the following reasons:   The valid total cholesterol range is 130 to 320 mg/dL    Assessment & Plan:  Type 2 diabetes  mellitus with diabetic neuropathy, without long-term current use of insulin (HCC) Assessment & Plan: A1C performed in office today and is 6.1, very well controlled on glimepiride 2 mg daily, ozempic 2 mg weekly, and metformin 1000 mg BID. Will continue this regimen.   Orders: -     POCT glycosylated hemoglobin (Hb A1C) -     Microalbumin / creatinine urine ratio  Primary hypertension Assessment & Plan: BP is slightly elevated today but she hasn't taken her medication yet today. Reinforced the need for compliance with her BP medication.  Current hypertension medications:       Sig   amLODipine (NORVASC) 2.5 MG tablet (Taking) TAKE 1 TABLET(2.5 MG) BY MOUTH DAILY   metoprolol tartrate (LOPRESSOR) 25 MG tablet (Taking) TAKE 1 TABLET BY MOUTH TWICE DAILY   valsartan-hydrochlorothiazide (DIOVAN-HCT) 160-25 MG tablet (Taking) TAKE 1 TABLET BY MOUTH ONCE DAILY      Continue medications as prescribed.    Vitamin D deficiency -     VITAMIN D 25 Hydroxy (Vit-D Deficiency, Fractures)   Pt needs new vitamin D level checked today, she is taking her supplements regularly.   Return in about 6 months (around 09/01/2023) for DM.    Karie Georges, MD

## 2023-03-05 NOTE — Telephone Encounter (Signed)
Please have patient stop her vitamin D supplements. Thanks!

## 2023-03-05 NOTE — Telephone Encounter (Signed)
Patient informed of the message below.

## 2023-03-09 NOTE — Assessment & Plan Note (Signed)
A1C performed in office today and is 6.1, very well controlled on glimepiride 2 mg daily, ozempic 2 mg weekly, and metformin 1000 mg BID. Will continue this regimen.

## 2023-03-09 NOTE — Assessment & Plan Note (Signed)
BP is slightly elevated today but she hasn't taken her medication yet today. Reinforced the need for compliance with her BP medication.  Current hypertension medications:       Sig   amLODipine (NORVASC) 2.5 MG tablet (Taking) TAKE 1 TABLET(2.5 MG) BY MOUTH DAILY   metoprolol tartrate (LOPRESSOR) 25 MG tablet (Taking) TAKE 1 TABLET BY MOUTH TWICE DAILY   valsartan-hydrochlorothiazide (DIOVAN-HCT) 160-25 MG tablet (Taking) TAKE 1 TABLET BY MOUTH ONCE DAILY      Continue medications as prescribed.

## 2023-05-01 ENCOUNTER — Other Ambulatory Visit: Payer: Self-pay | Admitting: Family Medicine

## 2023-05-01 DIAGNOSIS — E1140 Type 2 diabetes mellitus with diabetic neuropathy, unspecified: Secondary | ICD-10-CM

## 2023-05-04 ENCOUNTER — Encounter: Payer: Self-pay | Admitting: Internal Medicine

## 2023-06-01 ENCOUNTER — Other Ambulatory Visit: Payer: Self-pay | Admitting: Family Medicine

## 2023-06-01 DIAGNOSIS — E1140 Type 2 diabetes mellitus with diabetic neuropathy, unspecified: Secondary | ICD-10-CM

## 2023-06-12 ENCOUNTER — Other Ambulatory Visit: Payer: Self-pay | Admitting: Family Medicine

## 2023-06-12 DIAGNOSIS — E1140 Type 2 diabetes mellitus with diabetic neuropathy, unspecified: Secondary | ICD-10-CM

## 2023-06-30 ENCOUNTER — Other Ambulatory Visit: Payer: Self-pay | Admitting: Family Medicine

## 2023-06-30 DIAGNOSIS — E1140 Type 2 diabetes mellitus with diabetic neuropathy, unspecified: Secondary | ICD-10-CM

## 2023-07-03 ENCOUNTER — Other Ambulatory Visit: Payer: Self-pay | Admitting: Family Medicine

## 2023-07-03 DIAGNOSIS — I10 Essential (primary) hypertension: Secondary | ICD-10-CM

## 2023-09-01 ENCOUNTER — Ambulatory Visit (INDEPENDENT_AMBULATORY_CARE_PROVIDER_SITE_OTHER): Payer: Medicare Other | Admitting: Family Medicine

## 2023-09-01 ENCOUNTER — Telehealth: Payer: Self-pay | Admitting: *Deleted

## 2023-09-01 ENCOUNTER — Encounter: Payer: Self-pay | Admitting: Family Medicine

## 2023-09-01 VITALS — BP 144/70 | HR 70 | Temp 97.8°F | Ht 63.5 in | Wt 148.5 lb

## 2023-09-01 DIAGNOSIS — Z780 Asymptomatic menopausal state: Secondary | ICD-10-CM | POA: Diagnosis not present

## 2023-09-01 DIAGNOSIS — Z7985 Long-term (current) use of injectable non-insulin antidiabetic drugs: Secondary | ICD-10-CM

## 2023-09-01 DIAGNOSIS — I10 Essential (primary) hypertension: Secondary | ICD-10-CM

## 2023-09-01 DIAGNOSIS — E1140 Type 2 diabetes mellitus with diabetic neuropathy, unspecified: Secondary | ICD-10-CM

## 2023-09-01 DIAGNOSIS — Z1231 Encounter for screening mammogram for malignant neoplasm of breast: Secondary | ICD-10-CM

## 2023-09-01 DIAGNOSIS — E559 Vitamin D deficiency, unspecified: Secondary | ICD-10-CM | POA: Diagnosis not present

## 2023-09-01 DIAGNOSIS — Z7984 Long term (current) use of oral hypoglycemic drugs: Secondary | ICD-10-CM

## 2023-09-01 DIAGNOSIS — Z0000 Encounter for general adult medical examination without abnormal findings: Secondary | ICD-10-CM | POA: Diagnosis not present

## 2023-09-01 DIAGNOSIS — Z1211 Encounter for screening for malignant neoplasm of colon: Secondary | ICD-10-CM

## 2023-09-01 LAB — LIPID PANEL
Cholesterol: 115 mg/dL (ref 0–200)
HDL: 29.9 mg/dL — ABNORMAL LOW (ref >39.00)
LDL Cholesterol: 15 mg/dL (ref 0–99)
NonHDL: 84.94
Total CHOL/HDL Ratio: 4
Triglycerides: 352 mg/dL — ABNORMAL HIGH (ref 0.0–149.0)
VLDL: 70.4 mg/dL — ABNORMAL HIGH (ref 0.0–40.0)

## 2023-09-01 LAB — COMPREHENSIVE METABOLIC PANEL WITH GFR
ALT: 9 U/L (ref 0–35)
AST: 14 U/L (ref 0–37)
Albumin: 4.3 g/dL (ref 3.5–5.2)
Alkaline Phosphatase: 57 U/L (ref 39–117)
BUN: 18 mg/dL (ref 6–23)
CO2: 25 meq/L (ref 19–32)
Calcium: 9.2 mg/dL (ref 8.4–10.5)
Chloride: 100 meq/L (ref 96–112)
Creatinine, Ser: 1.01 mg/dL (ref 0.40–1.20)
GFR: 56.54 mL/min — ABNORMAL LOW (ref >60.00)
Glucose, Bld: 112 mg/dL — ABNORMAL HIGH (ref 70–99)
Potassium: 4.1 meq/L (ref 3.5–5.1)
Sodium: 137 meq/L (ref 135–145)
Total Bilirubin: 0.3 mg/dL (ref 0.2–1.2)
Total Protein: 7.4 g/dL (ref 6.0–8.3)

## 2023-09-01 LAB — POCT GLYCOSYLATED HEMOGLOBIN (HGB A1C): Hemoglobin A1C: 6.4 % — AB (ref 4.0–5.6)

## 2023-09-01 LAB — VITAMIN D 25 HYDROXY (VIT D DEFICIENCY, FRACTURES): VITD: 44.7 ng/mL (ref 30.00–100.00)

## 2023-09-01 NOTE — Progress Notes (Signed)
 Complete physical exam  Patient: Andrea [name]   DOB: [birth year]   68 y.o. Female  MRN: [id number]  Subjective:     Chief Complaint  Patient presents with   Medical Management of Chronic Issues    Andrea [name] is a 68 y.o. female who presents today for a complete physical exam. She reports consuming a general diet. Home exercise routine includes walking 3 hrs per week. She generally feels well. She reports sleeping fairly well, has to wake up to urinate during the night but overall states she feels rested. She does not have additional problems to discuss today.   Pt is here for follow up of her diabetes and HTN. She reports she is feeling well, no new symptoms or issues to report.   DM- is currently on metofrmin 1000 mg BID and semaglutide  2 mg weekly. A1C is performed in office and is well controlled. She denies any vision changes, no weight loss or gain. Reports good appetite, no nausea or abdominal pain, needs foot exam and eye exam today, pt was reminded to schedule her eye exam.  HTN -- BP in office performed and is slightly above goal. She reports she has not yet taken her BP medication today. She reports no side effects to the medications, no chest pain, SOB, dizziness or headaches. She has a BP cuff at home and is checking BP regularly, reports they are in the normal range.         Most recent fall risk assessment:    02/11/2023    9:43 AM  Fall Risk   Falls in the past year? 0  Number falls in past yr: 0  Injury with Fall? 0  Risk for fall due to : No Fall Risks  Follow up Falls prevention discussed     Most recent depression screenings:    02/11/2023    9:41 AM 01/28/2022    9:33 AM  PHQ 2/9 Scores  PHQ - 2 Score 0 0    Vision:used to see Dr. Gennie Kicks but he retired, has a new referral to a different specialist, states she will make an appt. and Dental: No current dental problems and Receives regular dental care  Patient Active Problem List   Diagnosis  Date Noted   Hyperlipidemia associated with type 2 diabetes mellitus (HCC) 09/09/2018   B12 deficiency 09/09/2018   Pseudophakia 05/15/2016   Type 2 diabetes mellitus with diabetic neuropathy, without long-term current use of insulin  (HCC) 05/24/2015   Hx of adenomatous colonic polyps 08/09/2014   Osteoarthritis 05/07/2007   HTN (hypertension) 11/19/2006   Coronary atherosclerosis 11/09/2006      Patient Care Team: Aida House, MD as PCP - General (Family Medicine) Albert Huff, MD as Consulting Physician (Ophthalmology) Alver Austin, [facility] (Inactive) as Pharmacist (Pharmacist)   Outpatient Medications Prior to Visit  Medication Sig   albuterol  (VENTOLIN  HFA) 108 (90 Base) MCG/ACT inhaler Inhale 2 puffs into the lungs every 6 (six) hours as needed for wheezing or shortness of breath.   amLODipine  (NORVASC ) 2.5 MG tablet TAKE 1 TABLET(2.5 MG) BY MOUTH DAILY   aspirin 81 MG tablet Take 81 mg by mouth daily.     cetirizine (ZYRTEC) 10 MG tablet Take 10 mg by mouth daily.   folic acid  (FOLVITE ) 1 MG tablet TAKE 1 TABLET (1 MG TOTAL) BY MOUTH EVERY MORNING.   glimepiride  (AMARYL ) 2 MG tablet TAKE ONE TABLET BY MOUTH BEFORE BREAKFAST   glucose blood (ONETOUCH ULTRA) test strip  USE TO TEST BLOOD SUGAR DAILY AND AS NEEDED   Insulin  Pen Needle 32G X 4 MM MISC Use as directed   metFORMIN  (GLUCOPHAGE ) 1000 MG tablet TAKE ONE (1) TABLET BY MOUTH TWICE DAILY   metoprolol  tartrate (LOPRESSOR ) 25 MG tablet TAKE ONE (1) TABLET BY MOUTH TWICE DAILY   ONETOUCH DELICA LANCETS FINE MISC 1 Stick by Does not apply route daily.   OZEMPIC , 2 MG/DOSE, 8 MG/3ML SOPN INJECT 2MG  SUBCUTANEOUSLY ONCE WEEKLY   pantoprazole  (PROTONIX ) 40 MG tablet TAKE 1 TABLET (40 MG TOTAL) BY MOUTH DAILY.   rosuvastatin  (CRESTOR ) 20 MG tablet TAKE 1 TABLET (20 MG TOTAL) BY MOUTH EVERY EVENING.   valsartan -hydrochlorothiazide  (DIOVAN -HCT) 160-25 MG tablet TAKE 1 TABLET BY MOUTH ONCE DAILY   vitamin B-12  (CYANOCOBALAMIN ) 500 MCG tablet Take 500 mcg by mouth daily.    [DISCONTINUED] Vitamin D , Ergocalciferol , (DRISDOL ) 1.25 MG (50000 UNIT) CAPS capsule Take 1 capsule (50,000 Units total) by mouth every 7 (seven) days.   No facility-administered medications prior to visit.    Review of Systems  HENT:  Negative for hearing loss.   Eyes:  Negative for blurred vision.  Respiratory:  Negative for shortness of breath.   Cardiovascular:  Negative for chest pain.  Gastrointestinal: Negative.   Genitourinary: Negative.   Musculoskeletal:  Negative for back pain.  Neurological:  Negative for headaches.  Psychiatric/Behavioral:  Negative for depression.        Objective:     BP (!) 144/70   Pulse 70   Temp 97.8 F (36.6 C) (Oral)   Ht 5' 3.5" (1.613 m)   Wt 148 lb 8 oz (67.4 kg)   SpO2 97%   BMI 25.89 kg/m    Physical Exam Vitals reviewed.  Constitutional:      Appearance: Normal appearance. She is well-groomed and normal weight.  HENT:     Right Ear: Tympanic membrane and ear canal normal.     Left Ear: Tympanic membrane and ear canal normal.     Mouth/Throat:     Mouth: Mucous membranes are moist.     Pharynx: No posterior oropharyngeal erythema.  Eyes:     Conjunctiva/sclera: Conjunctivae normal.  Neck:     Thyroid : No thyromegaly.  Cardiovascular:     Rate and Rhythm: Normal rate and regular rhythm.     Pulses: Normal pulses.     Heart sounds: S1 normal and S2 normal.  Pulmonary:     Effort: Pulmonary effort is normal.     Breath sounds: Normal breath sounds and air entry.  Abdominal:     General: Abdomen is flat. Bowel sounds are normal.     Palpations: Abdomen is soft.  Musculoskeletal:     Right lower leg: No edema.     Left lower leg: No edema.  Lymphadenopathy:     Cervical: No cervical adenopathy.  Neurological:     Mental Status: She is alert and oriented to person, place, and time. Mental status is at baseline.     Gait: Gait is intact.  Psychiatric:         Mood and Affect: Mood and affect normal.        Speech: Speech normal.        Behavior: Behavior normal.        Judgment: Judgment normal.         Assessment & Plan:    Routine Health Maintenance and Physical Exam  Immunization History  Administered Date(s) Administered   Fluad Quad(high Dose  65+) 12/21/2019, 02/06/2021, 12/31/2021   Fluad Trivalent(High Dose 65+) 02/11/2023   Influenza Whole 01/12/1997, 01/05/2009   Influenza,inj,Quad PF,6+ Mos 01/12/2015, 12/27/2015, 03/09/2017, 12/01/2018   Influenza-Unspecified 02/12/2014, 01/19/2018   PFIZER(Purple Top)SARS-COV-2 Vaccination 07/07/2019, 08/01/2019, 02/06/2021   Pneumococcal Conjugate-13 12/01/2018   Pneumococcal Polysaccharide-23 04/24/2008, 01/31/2014, 12/21/2019   Td 11/13/1997, 04/27/2009   Tdap 05/20/2020   Zoster Recombinant(Shingrix) 08/22/2017, 11/12/2017, 01/12/2018    Health Maintenance  Topic Date Due   FOOT EXAM  09/05/2022   COVID-19 Vaccine (4 - 2024-25 season) 12/14/2022   Colonoscopy  04/30/2023   OPHTHALMOLOGY EXAM  06/19/2023   INFLUENZA VACCINE  11/13/2023   Medicare Annual Wellness (AWV)  02/11/2024   Diabetic kidney evaluation - Urine ACR  03/03/2024   HEMOGLOBIN A1C  03/03/2024   MAMMOGRAM  08/28/2024   Diabetic kidney evaluation - eGFR measurement  08/31/2024   DTaP/Tdap/Td (4 - Td or Tdap) 05/20/2030   Pneumonia Vaccine 28+ Years old  Completed   DEXA SCAN  Completed   Hepatitis C Screening  Completed   Zoster Vaccines- Shingrix  Completed   HPV VACCINES  Aged Out   Meningococcal B Vaccine  Aged Out    Discussed health benefits of physical activity, and encouraged her to engage in regular exercise appropriate for her age and condition.  Type 2 diabetes mellitus with diabetic neuropathy, without long-term current use of insulin  (HCC) Assessment & Plan: A1C performed in office today and is 6.4, very well controlled on glimepiride  2 mg daily, ozempic  2 mg weekly, and metformin  1000 mg  BID. Will continue this regimen.  She is due for her annual blood work today, lipid, CMP ordered   Orders: -     POCT glycosylated hemoglobin (Hb A1C) -     Collection capillary blood specimen -     Comprehensive metabolic panel with GFR; Future -     Lipid panel; Future  Colon cancer screening -     Ambulatory referral to Gastroenterology  Vitamin D  deficiency -     VITAMIN D  25 Hydroxy (Vit-D Deficiency, Fractures); Future  Postmenopausal state -     DG Bone Density; Future  Breast cancer screening by mammogram -     3D Screening Mammogram, Left and Right; Future  Primary hypertension Assessment & Plan: BP is slightly elevated today but she hasn't taken her medication yet today. Reinforced the need for compliance with her BP medication.  Current hypertension medications:       Sig   amLODipine  (NORVASC ) 2.5 MG tablet (Taking) TAKE 1 TABLET(2.5 MG) BY MOUTH DAILY   metoprolol  tartrate (LOPRESSOR ) 25 MG tablet (Taking) TAKE ONE (1) TABLET BY MOUTH TWICE DAILY   valsartan -hydrochlorothiazide  (DIOVAN -HCT) 160-25 MG tablet (Taking) TAKE 1 TABLET BY MOUTH ONCE DAILY      Continue medications as prescribed.   Patient't physical exam findings are normal/ stable from previous exams. I reviewed her health maintenance and orders placed for mammo, dexa and referral for GI sent for colon cancer screening. I counseled the patient on healthy sleep and exercise, handouts given on general health topics as well.    Return in about 6 months (around 03/03/2024) for DM, HTN.     Aida House, MD

## 2023-09-01 NOTE — Telephone Encounter (Signed)
 Spoke with the patient and she stated she would prefer to have the mammogram and bone density at [facility], which is where the previous studies were [facility].  Message sent to PCP to approve the location change and patient is aware the orders will be faxed to [facility].

## 2023-09-01 NOTE — Telephone Encounter (Signed)
 Copied from CRM [id number]. Topic: General - Other >> Sep 01, 2023  3:24 PM Turkey A wrote: Reason for CRM: Patient would like for Mia Adam to call her back due to the mammogram being sent to wrong place-please call

## 2023-09-01 NOTE — Telephone Encounter (Signed)
 Ok to send orders to [facility]

## 2023-09-02 NOTE — Telephone Encounter (Signed)
 Orders printed and faxed to [facility] at [phone].

## 2023-09-03 ENCOUNTER — Ambulatory Visit: Payer: Self-pay | Admitting: Family Medicine

## 2023-09-05 NOTE — Assessment & Plan Note (Signed)
 BP is slightly elevated today but she hasn't taken her medication yet today. Reinforced the need for compliance with her BP medication.  Current hypertension medications:       Sig   amLODipine  (NORVASC ) 2.5 MG tablet (Taking) TAKE 1 TABLET(2.5 MG) BY MOUTH DAILY   metoprolol  tartrate (LOPRESSOR ) 25 MG tablet (Taking) TAKE ONE (1) TABLET BY MOUTH TWICE DAILY   valsartan -hydrochlorothiazide  (DIOVAN -HCT) 160-25 MG tablet (Taking) TAKE 1 TABLET BY MOUTH ONCE DAILY      Continue medications as prescribed.

## 2023-09-05 NOTE — Assessment & Plan Note (Signed)
 A1C performed in office today and is 6.4, very well controlled on glimepiride  2 mg daily, ozempic  2 mg weekly, and metformin  1000 mg BID. Will continue this regimen.  She is due for her annual blood work today, lipid, CMP ordered

## 2023-09-08 DIAGNOSIS — M8588 Other specified disorders of bone density and structure, other site: Secondary | ICD-10-CM | POA: Diagnosis not present

## 2023-09-08 DIAGNOSIS — Z780 Asymptomatic menopausal state: Secondary | ICD-10-CM | POA: Diagnosis not present

## 2023-09-08 DIAGNOSIS — Z1231 Encounter for screening mammogram for malignant neoplasm of breast: Secondary | ICD-10-CM | POA: Diagnosis not present

## 2023-09-08 LAB — HM MAMMOGRAPHY

## 2023-09-08 LAB — HM DEXA SCAN

## 2023-09-09 ENCOUNTER — Encounter: Payer: Self-pay | Admitting: Family Medicine

## 2023-10-07 ENCOUNTER — Encounter: Payer: Self-pay | Admitting: Family Medicine

## 2023-10-23 ENCOUNTER — Telehealth: Payer: Self-pay | Admitting: Family Medicine

## 2023-10-23 MED ORDER — CONTOUR PLUS TEST VI STRP
ORAL_STRIP | 3 refills | Status: AC
Start: 2023-10-23 — End: ?

## 2023-10-23 MED ORDER — CONTOUR PLUS BLUE W/DEVICE KIT: 1.0000 | PACK | Freq: Two times a day (BID) | 1 refills | Status: AC

## 2023-10-23 NOTE — Telephone Encounter (Signed)
 Ok to send new order for the new machine

## 2023-10-23 NOTE — Telephone Encounter (Signed)
 Rx done.

## 2023-10-23 NOTE — Telephone Encounter (Signed)
 Copied from CRM [id number]. Topic: Clinical - Prescription Issue >> Oct 23, 2023 11:03 AM Larissa RAMAN wrote: Reason for CRM: Patient states she received a letter from her insurance provider that they are discontinuing her current glucose monitor and test strips. She states approved monitor and test strips are: Contour plus blue, Contour next gen, Contour next one, contour plus test strips,  and contour next plus test strips.

## 2023-11-04 ENCOUNTER — Telehealth: Payer: Self-pay | Admitting: *Deleted

## 2023-11-04 NOTE — Telephone Encounter (Signed)
 Noted

## 2023-11-04 NOTE — Telephone Encounter (Signed)
 Copied from CRM #[id number]. Topic: Clinical - Medication Question >> Nov 03, 2023  3:08 PM Andrea [name] wrote: Reason for CRM: pt called and stated that she was going to switch to [facility] from [place] but she was informed that her meds will be only $4 until the end of the year so she wants to continue using that pharmacy.

## 2023-11-04 NOTE — Telephone Encounter (Signed)
 Copied from CRM #[id number]. Topic: General - Other >> Nov 04, 2023  8:54 AM Burnard DEL wrote: Reason for CRM: Patient returned call to [facility] and stated that she is just going [facility],and may just switch to [facility] at the end of the year.

## 2023-11-04 NOTE — Telephone Encounter (Signed)
 Left a detailed message at the patient's cell number to call back and leave information as to the names of each medication she needs refills on.

## 2023-11-26 ENCOUNTER — Other Ambulatory Visit: Payer: Self-pay | Admitting: Family Medicine

## 2023-11-26 DIAGNOSIS — E1140 Type 2 diabetes mellitus with diabetic neuropathy, unspecified: Secondary | ICD-10-CM

## 2023-12-28 ENCOUNTER — Other Ambulatory Visit: Payer: Self-pay | Admitting: Family Medicine

## 2023-12-28 DIAGNOSIS — E1140 Type 2 diabetes mellitus with diabetic neuropathy, unspecified: Secondary | ICD-10-CM

## 2023-12-28 DIAGNOSIS — I10 Essential (primary) hypertension: Secondary | ICD-10-CM

## 2024-01-01 ENCOUNTER — Other Ambulatory Visit: Payer: Self-pay | Admitting: Family Medicine

## 2024-01-01 DIAGNOSIS — I10 Essential (primary) hypertension: Secondary | ICD-10-CM

## 2024-01-07 ENCOUNTER — Telehealth: Payer: Self-pay

## 2024-01-07 DIAGNOSIS — E1140 Type 2 diabetes mellitus with diabetic neuropathy, unspecified: Secondary | ICD-10-CM

## 2024-01-07 NOTE — Telephone Encounter (Signed)
 I'm not sure about the folic acid -- the ozempic  is for diabetes type 2

## 2024-01-07 NOTE — Telephone Encounter (Signed)
 Copied from CRM [id number]. Topic: Clinical - Medication Question >> Jan 07, 2024  3:29 PM Aisha D wrote: Reason for CRM: Pt stated that [facility] is wanting to know why she is taking folic acid  (FOLVITE ) 1 MG tablet and OZEMPIC , 2 MG/DOSE, 8 MG/3ML SOPN. Pt stated that they would like to know the medical reasons for the meds. Pt stated that [facility] would also like to know the medical reasons and would like to have this info faxed. FAX: [phone]. Pt would like a callback to discuss this information.

## 2024-01-12 MED ORDER — OZEMPIC (2 MG/DOSE) 8 MG/3ML ~~LOC~~ SOPN: PEN_INJECTOR | SUBCUTANEOUS | 2 refills | Status: DC

## 2024-01-12 NOTE — Telephone Encounter (Signed)
 Patient informed of the message below.

## 2024-01-12 NOTE — Telephone Encounter (Signed)
 Rx was sent to [facility] with dx code for Ozempic 

## 2024-01-12 NOTE — Addendum Note (Signed)
 Addended by: METTA KRISTEN CROME on: 01/12/2024 04:38 PM   Modules accepted: Orders

## 2024-01-15 ENCOUNTER — Telehealth: Payer: Self-pay | Admitting: *Deleted

## 2024-01-15 NOTE — Telephone Encounter (Signed)
 Copied from CRM [id number]. Topic: Clinical - Medication Question >> Jan 07, 2024  3:29 PM Andrea [name] wrote: Reason for CRM: Pt stated that [employer] is wanting to know why she is taking folic acid  (FOLVITE ) 1 MG tablet and OZEMPIC , 2 MG/DOSE, 8 MG/3ML SOPN. Pt stated that they would like to know the medical reasons for the meds. Pt stated that [facility] would also like to know the medical reasons and would like to have this info faxed. FAX: [phone]. Pt would like a callback to discuss this information. >> Jan 15, 2024  9:13 AM Andrea [name] wrote: Patient would like to be called in regards to her folic acid  and she does not know if the provider took her off the medication [phone]

## 2024-02-03 NOTE — Progress Notes (Signed)
 Andrea [name]                                          MRN: [id number]   02/03/2024   The [facility] Quality Team Specialist reviewed this patient medical record for the purposes of chart review for care gap closure. The following were reviewed: abstraction for care gap closure-glycemic status assessment. Chart reviewed also for KED labs. AWV upcoming on 02/17/2024    [facility] Quality Team

## 2024-02-17 ENCOUNTER — Ambulatory Visit: Payer: Medicare Other

## 2024-02-17 VITALS — Ht 63.5 in | Wt 148.0 lb

## 2024-02-17 DIAGNOSIS — Z0000 Encounter for general adult medical examination without abnormal findings: Secondary | ICD-10-CM

## 2024-02-17 NOTE — Patient Instructions (Signed)
 Ms. Ottaway,  Thank you for taking the time for your Medicare Wellness Visit. I appreciate your continued commitment to your health goals. Please review the care plan we discussed, and feel free to reach out if I can assist you further.  Please note that Annual Wellness Visits do not include a physical exam. Some assessments may be limited, especially if the visit was conducted virtually. If needed, we may recommend an in-person follow-up with your provider.  Ongoing Care Seeing your primary care provider every 3 to 6 months helps us  monitor your health and provide consistent, personalized care.   Referrals If a referral was made during today's visit and you haven't received any updates within two weeks, please contact the referred provider directly to check on the status.  Recommended Screenings:  Health Maintenance  Topic Date Due   Yearly kidney health urinalysis for diabetes  12/20/2020   Complete foot exam   09/05/2022   Colon Cancer Screening  04/30/2023   Eye exam for diabetics  06/19/2023   Flu Shot  11/13/2023   COVID-19 Vaccine (4 - 2025-26 season) 12/14/2023   Hemoglobin A1C  03/03/2024   Yearly kidney function blood test for diabetes  08/31/2024   Medicare Annual Wellness Visit  02/16/2025   Breast Cancer Screening  09/07/2025   DTaP/Tdap/Td vaccine (4 - Td or Tdap) 05/20/2030   Pneumococcal Vaccine for age over 80  Completed   DEXA scan (bone density measurement)  Completed   Hepatitis C Screening  Completed   Zoster (Shingles) Vaccine  Completed   Meningitis B Vaccine  Aged Out       02/17/2024   10:27 AM  Advanced Directives  Does Patient Have a Medical Advance Directive? No  Would patient like information on creating a medical advance directive? Yes (MAU/Ambulatory/Procedural Areas - Information given)   Information on Advanced Care Planning can be found at   Secretary of [facility] Advance Health Care Directives Advance Health Care Directives  ([email or web address]    Vision: Annual vision screenings are recommended for early detection of glaucoma, cataracts, and diabetic retinopathy. These exams can also reveal signs of chronic conditions such as diabetes and high blood pressure.  Dental: Annual dental screenings help detect early signs of oral cancer, gum disease, and other conditions linked to overall health, including heart disease and diabetes.  Please see the attached documents for additional preventive care recommendations.

## 2024-02-17 NOTE — Progress Notes (Addendum)
 Subjective:   Andrea [name] is a 68 y.o. female who presents for a Medicare Annual Wellness Visit.  Visit Complete: Virtual I connected with this patient by a audio enabled telemedicine application and verified that I am speaking with the correct person using two identifiers.  Patient Location: Home Provider Location: Home Office  I discussed the limitations of evaluation and management by telemedicine. The patient expressed understanding and agreed to proceed.  Persons Participating in Visit: Patient  Allergies (verified) Plavix [clopidogrel bisulfate]   History: Past Medical History:  Diagnosis Date   Allergy    Anemia    Arthritis    B12 deficiency 09/09/2018   CAD (coronary artery disease)    has 3 stents   Cataract    removed both eyes    Diabetes mellitus    Hx of adenomatous colonic polyps 08/09/2014   Hyperlipidemia    Hypertension    Low back pain    Past Surgical History:  Procedure Laterality Date   BREAST LUMPECTOMY     carpal tunnel release both hands      CESAREAN SECTION     2 times   COLONOSCOPY     x 2- hx colon polyps-    EYE SURGERY     lens replaced/02/06/15 and 03/09/15   knee surgery for torn cartillige     right knee   LASIK  01/12/2014, 03/14/2014   POLYPECTOMY     rotator cuff surgery     right side   stents  2000   stents 2001  2001   3 stents   triger finger repair     both hands, right hand done twice   TUBAL LIGATION     Family History  Problem Relation Age of Onset   Heart disease Mother    Hypertension Mother    Heart disease Father    Heart disease Sister    Other Sister        tumor on diaphragm; oxygen depdt   CAD Brother        stenting   Pancreatic cancer Paternal Uncle    Other Cousin        sepsis   Colitis Neg Hx    Colon cancer Neg Hx    Esophageal cancer Neg Hx    Rectal cancer Neg Hx    Stomach cancer Neg Hx    Social History   Occupational History   Occupation: Retired  Tobacco Use   Smoking  status: Former    Types: E-cigarettes    Quit date: 06/20/2010    Years since quitting: 13.6   Smokeless tobacco: Never   Tobacco comments:    smoked 1ppd for> 30 years/smokes occasionally,some vapor cigarettes  Vaping Use   Vaping status: Former  Substance and Sexual Activity   Alcohol use: No    Alcohol/week: 0.0 standard drinks of alcohol   Drug use: No   Sexual activity: Not Currently   Tobacco Counseling Counseling given: Not Answered Tobacco comments: smoked 1ppd for> 30 years/smokes occasionally,some vapor cigarettes  SDOH Screenings   Food Insecurity: No Food Insecurity (02/17/2024)  Housing: Low Risk  (02/17/2024)  Transportation Needs: No Transportation Needs (02/17/2024)  Utilities: Not At Risk (02/17/2024)  Alcohol Screen: Low Risk  (02/11/2023)  Depression (PHQ2-9): Low Risk  (02/17/2024)  Financial Resource Strain: Low Risk  (02/11/2023)  Physical Activity: Insufficiently Active (02/17/2024)  Social Connections: Socially Isolated (02/17/2024)  Stress: No Stress Concern Present (02/17/2024)  Tobacco Use: Medium Risk (02/17/2024)  Health Literacy: Adequate Health Literacy (02/17/2024)   Depression Screen    02/17/2024   10:33 AM 02/11/2023    9:41 AM 01/28/2022    9:33 AM 12/31/2021   10:03 AM 08/12/2021    9:24 AM 05/20/2021    9:50 AM 01/15/2021    9:41 AM  PHQ 2/9 Scores  PHQ - 2 Score 0 0 0 0 0 2 0  PHQ- 9 Score    4 0 10      Goals Addressed               This Visit's Progress     Stay Active! (pt-stated)   On track      Visit info / Clinical Intake: Medicare Wellness Visit Type:: Subsequent Annual Wellness Visit Medicare Wellness Visit Mode:: Telephone If telephone:: video declined If telephone or video:: vitals recorded from last visit Interpreter Needed?: No Pre-visit prep was completed: yes AWV questionnaire completed by patient prior to visit?: no Living arrangements:: (!) lives alone Patient's Overall Health Status Rating: very good Typical  amount of pain: none Does pain affect daily life?: no Are you currently prescribed opioids?: no  Dietary Habits and Nutritional Risks How many meals a day?: 3 Eats fruit and vegetables daily?: yes Most meals are obtained by: preparing own meals Diabetic:: (!) yes Any non-healing wounds?: no How often do you check your BS?: 1 Would you like to be referred to a Nutritionist or for Diabetic Management? : no  Functional Status Activities of Daily Living (to include ambulation/medication): Independent Ambulation: Independent Medication Administration: Independent Home Management: Independent Manage your own finances?: yes Primary transportation is: driving Concerns about vision?: no *vision screening is required for WTM* Concerns about hearing?: no  Fall Screening Falls in the past year?: 0 Number of falls in past year: 0 Was there an injury with Fall?: 0 Fall Risk Category Calculator: 0 Patient Fall Risk Level: Low Fall Risk  Fall Risk Patient at Risk for Falls Due to: No Fall Risks Fall risk Follow up: Falls evaluation completed; Falls prevention discussed; Education provided  Home and Transportation Safety: All rugs have non-skid backing?: yes All stairs or steps have railings?: yes Grab bars in the bathtub or shower?: yes Have non-skid surface in bathtub or shower?: yes Good home lighting?: yes Regular seat belt use?: yes Hospital stays in the last year:: no  Cognitive Assessment Difficulty concentrating, remembering, or making decisions? : no Will 6CIT or Mini Cog be Completed: yes What year is it?: 0 points What month is it?: 0 points Give patient an address phrase to remember (5 components): [id number] [place] Count backwards from 20 to 1: 0 points Say the months of the year in reverse: 0 points Repeat the address phrase from earlier: 2 points  Advance Directives (For Healthcare) Does Patient Have a Medical Advance Directive?: No Would patient like  information on creating a medical advance directive?: Yes (MAU/Ambulatory/Procedural Areas - Information given)  Reviewed/Updated  Reviewed/Updated: All        Objective:    Today's Vitals   02/17/24 0923  Weight: 148 lb (67.1 kg)  Height: 5' 3.5 (1.613 m)   Body mass index is 25.81 kg/m.  Current Medications (verified) Outpatient Encounter Medications as of 02/17/2024  Medication Sig   albuterol  (VENTOLIN  HFA) 108 (90 Base) MCG/ACT inhaler Inhale 2 puffs into the lungs every 6 (six) hours as needed for wheezing or shortness of breath.   amLODipine  (NORVASC ) 2.5 MG tablet TAKE 1 TABLET(2.5 MG)  BY MOUTH DAILY   aspirin 81 MG tablet Take 81 mg by mouth daily.     Blood Glucose Monitoring Suppl (CONTOUR PLUS BLUE) w/Device KIT 1 each by Does not apply route 2 (two) times daily.   cetirizine (ZYRTEC) 10 MG tablet Take 10 mg by mouth daily.   glimepiride  (AMARYL ) 2 MG tablet TAKE 1 TABLET BY MOUTH BEFORE BREAKFAST   glucose blood (CONTOUR PLUS TEST) test strip Use as instructed twice a day   Insulin  Pen Needle 32G X 4 MM MISC Use as directed   metFORMIN  (GLUCOPHAGE ) 1000 MG tablet TAKE ONE (1) TABLET BY MOUTH TWICE DAILY   metoprolol  tartrate (LOPRESSOR ) 25 MG tablet TAKE ONE (1) TABLET BY MOUTH TWICE DAILY   ONETOUCH DELICA LANCETS FINE MISC 1 Stick by Does not apply route daily.   pantoprazole  (PROTONIX ) 40 MG tablet TAKE 1 TABLET BY MOUTH DAILY   rosuvastatin  (CRESTOR ) 20 MG tablet TAKE 1 TABLET BY MOUTH EVERY EVENING   Semaglutide , 2 MG/DOSE, (OZEMPIC , 2 MG/DOSE,) 8 MG/3ML SOPN INJECT 2MG  SUBCUTANEOUSLY ONCE WEEKLY   valsartan -hydrochlorothiazide  (DIOVAN -HCT) 160-25 MG tablet TAKE 1 TABLET BY MOUTH ONCE DAILY   vitamin B-12 (CYANOCOBALAMIN ) 500 MCG tablet Take 500 mcg by mouth daily.    folic acid  (FOLVITE ) 1 MG tablet TAKE 1 TABLET BY MOUTH EVERY MORNING (Patient not taking: Reported on 02/17/2024)   No facility-administered encounter medications on file as of 02/17/2024.    Hearing/Vision screen Hearing Screening - Comments:: Patient is able to hear conversational tones without difficulty. No issues reported.   Vision Screening - Comments:: No vision problems; will schedule routine eye exam soon    Immunizations and Health Maintenance Health Maintenance  Topic Date Due   Diabetic kidney evaluation - Urine ACR  12/20/2020   FOOT EXAM  09/05/2022   Colonoscopy  04/30/2023   OPHTHALMOLOGY EXAM  06/19/2023   Influenza Vaccine  11/13/2023   COVID-19 Vaccine (4 - 2025-26 season) 12/14/2023   HEMOGLOBIN A1C  03/03/2024   Diabetic kidney evaluation - eGFR measurement  08/31/2024   Medicare Annual Wellness (AWV)  02/16/2025   Mammogram  09/07/2025   DTaP/Tdap/Td (4 - Td or Tdap) 05/20/2030   Pneumococcal Vaccine: 50+ Years  Completed   DEXA SCAN  Completed   Hepatitis C Screening  Completed   Zoster Vaccines- Shingrix  Completed   Meningococcal B Vaccine  Aged Out      Wants to postpone colon screening due to not having someone to take her.  Assessment/Plan:  This is a routine wellness examination for Haide.  Patient Care Team: Ozell Heron HERO, MD as PCP - General (Family Medicine) Roz Anes, MD as Consulting Physician (Ophthalmology) Liane Sharyne MATSU, [facility] (Inactive) as Pharmacist (Pharmacist)  I have personally reviewed and noted the following in the patient's chart:   Medical and social history Use of alcohol, tobacco or illicit drugs  Current medications and supplements including opioid prescriptions. Functional ability and status Nutritional status Physical activity Advanced directives List of other physicians Hospitalizations, surgeries, and ER visits in previous 12 months Vitals Screenings to include cognitive, depression, and falls Referrals and appointments  No orders of the defined types were placed in this encounter.  In addition, I have reviewed and discussed with patient certain preventive protocols, quality metrics,  and best practice recommendations. A written personalized care plan for preventive services as well as general preventive health recommendations were provided to patient.   Lavelle Charmaine Browner, [place]   [id number]/07/7972   Return in 1  year (on 02/16/2025).  After Visit Summary: (MyChart) Due to this being a telephonic visit, the after visit summary with patients personalized plan was offered to patient via MyChart   Nurse Notes: Patient would like to discuss at next appointment if she needs to continue taking folic acid .

## 2024-02-24 NOTE — Progress Notes (Addendum)
 Andrea [name]                                          MRN: [id number]   02/24/2024   The [facility] Quality Team Specialist reviewed this patient medical record for the purposes of chart review for care gap closure. The following were reviewed: chart review for care gap closure-kidney health evaluation for diabetes:eGFR  and uACR. Upcoming appt, 11/20- need uACR completed.  03/22/2024- ABSTRACTED GSD AND KED.    [facility]BCI Quality Team

## 2024-02-28 ENCOUNTER — Other Ambulatory Visit: Payer: Self-pay | Admitting: Family Medicine

## 2024-02-28 DIAGNOSIS — E1140 Type 2 diabetes mellitus with diabetic neuropathy, unspecified: Secondary | ICD-10-CM

## 2024-03-03 ENCOUNTER — Ambulatory Visit: Admitting: Family Medicine

## 2024-03-17 ENCOUNTER — Encounter: Payer: Self-pay | Admitting: Family Medicine

## 2024-03-17 ENCOUNTER — Ambulatory Visit: Admitting: Family Medicine

## 2024-03-17 VITALS — BP 132/60 | HR 79 | Temp 98.2°F | Ht 63.5 in | Wt 144.7 lb

## 2024-03-17 DIAGNOSIS — E538 Deficiency of other specified B group vitamins: Secondary | ICD-10-CM

## 2024-03-17 DIAGNOSIS — Z23 Encounter for immunization: Secondary | ICD-10-CM

## 2024-03-17 DIAGNOSIS — E1140 Type 2 diabetes mellitus with diabetic neuropathy, unspecified: Secondary | ICD-10-CM

## 2024-03-17 DIAGNOSIS — Z0000 Encounter for general adult medical examination without abnormal findings: Secondary | ICD-10-CM

## 2024-03-17 LAB — POCT GLYCOSYLATED HEMOGLOBIN (HGB A1C): Hemoglobin A1C: 6.2 % — AB (ref 4.0–5.6)

## 2024-03-17 LAB — MICROALBUMIN / CREATININE URINE RATIO
Creatinine,U: 84.1 mg/dL
Microalb Creat Ratio: 20.7 mg/g (ref 0.0–30.0)
Microalb, Ur: 1.7 mg/dL (ref 0.0–1.9)

## 2024-03-17 LAB — FOLATE: Folate: 16.6 ng/mL (ref >5.9)

## 2024-03-17 NOTE — Progress Notes (Signed)
 Complete physical exam  Patient: Andrea [name]   DOB: 06-Aug-1955   68 y.o. Female  MRN: [id number]  Subjective:    Chief Complaint  Patient presents with   Medical Management of Chronic Issues    Andrea [name] is a 68 y.o. female who presents today for a complete physical exam. She reports consuming a general diet. Usually only eats 1 meal per day, Home exercise routine includes walking 1-2 hrs per week. She generally feels well. She reports sleeping well. She does not have additional problems to discuss today.   Discussed the use of AI scribe software for clinical note transcription with the patient, who gave verbal consent to proceed.  History of Present Illness   Andrea [name] is a 68 year old female who presents for a six-month follow-up visit.  She is awaiting her A1c result. She previously had folic acid  deficiency diagnosed in November 2022 and stopped supplementation after her levels improved.  She has not seen an eye doctor this year since her prior ophthalmologist retired and is interested in referral to a teaching laboratory technician in [place].  She is due for a colonoscopy and is working on arranging someone to accompany her so she can schedule the procedure.  She has nighttime muscle cramps in her feet, worse when they are cold. She denies numbness, burning, tingling, and reports no shoe discomfort.      Most recent fall risk assessment:    02/17/2024   10:27 AM  Fall Risk   Falls in the past year? 0  Number falls in past yr: 0  Injury with Fall? 0   Risk for fall due to : No Fall Risks  Follow up Falls evaluation completed;Falls prevention discussed;Education provided     Data saved with a previous flowsheet row definition     Most recent depression screenings:    02/17/2024   10:33 AM 02/11/2023    9:41 AM  PHQ 2/9 Scores  PHQ - 2 Score 0 0    Vision:Not within last year  and needs new referral since her previous specialist Dr. Roz retired. and  Dental: No current dental problems and Receives regular dental care  Patient Active Problem List   Diagnosis Date Noted   Hyperlipidemia associated with type 2 diabetes mellitus (HCC) 09/09/2018   B12 deficiency 09/09/2018   Pseudophakia 05/15/2016   Type 2 diabetes mellitus with diabetic neuropathy, without long-term current use of insulin  (HCC) 05/24/2015   Hx of adenomatous colonic polyps 08/09/2014   Osteoarthritis 05/07/2007   HTN (hypertension) 11/19/2006   Coronary atherosclerosis 11/09/2006      Patient Care Team: Ozell Heron HERO, MD as PCP - General (Family Medicine) Roz Anes, MD as Consulting Physician (Ophthalmology) Liane Sharyne MATSU, [facility] (Inactive) as Pharmacist (Pharmacist)   Outpatient Medications Prior to Visit  Medication Sig   albuterol  (VENTOLIN  HFA) 108 (90 Base) MCG/ACT inhaler Inhale 2 puffs into the lungs every 6 (six) hours as needed for wheezing or shortness of breath.   amLODipine  (NORVASC ) 2.5 MG tablet TAKE 1 TABLET(2.5 MG) BY MOUTH DAILY   aspirin 81 MG tablet Take 81 mg by mouth daily.     Blood Glucose Monitoring Suppl (CONTOUR PLUS BLUE) w/Device KIT 1 each by Does not apply route 2 (two) times daily.   cetirizine (ZYRTEC) 10 MG tablet Take 10 mg by mouth daily.   glimepiride  (AMARYL ) 2 MG tablet TAKE 1 TABLET BY MOUTH BEFORE BREAKFAST   glucose blood (CONTOUR PLUS TEST) test  strip Use as instructed twice a day   Insulin  Pen Needle 32G X 4 MM MISC Use as directed   metFORMIN  (GLUCOPHAGE ) 1000 MG tablet TAKE ONE (1) TABLET BY MOUTH TWICE DAILY   metoprolol  tartrate (LOPRESSOR ) 25 MG tablet TAKE ONE (1) TABLET BY MOUTH TWICE DAILY   ONETOUCH DELICA LANCETS FINE MISC 1 Stick by Does not apply route daily.   pantoprazole  (PROTONIX ) 40 MG tablet TAKE 1 TABLET BY MOUTH DAILY   rosuvastatin  (CRESTOR ) 20 MG tablet TAKE 1 TABLET BY MOUTH EVERY EVENING   Semaglutide , 2 MG/DOSE, (OZEMPIC , 2 MG/DOSE,) 8 MG/3ML SOPN INJECT SUBCUTANEOUSLY 2 MG EVERY WEEK    valsartan -hydrochlorothiazide  (DIOVAN -HCT) 160-25 MG tablet TAKE 1 TABLET BY MOUTH ONCE DAILY   vitamin B-12 (CYANOCOBALAMIN ) 500 MCG tablet Take 500 mcg by mouth daily.    [DISCONTINUED] folic acid  (FOLVITE ) 1 MG tablet TAKE 1 TABLET BY MOUTH EVERY MORNING   No facility-administered medications prior to visit.    Review of Systems  HENT:  Negative for hearing loss.   Eyes:  Negative for blurred vision.  Respiratory:  Negative for shortness of breath.   Cardiovascular:  Negative for chest pain.  Gastrointestinal: Negative.   Genitourinary: Negative.   Musculoskeletal:  Negative for back pain.  Neurological:  Negative for headaches.  Psychiatric/Behavioral:  Negative for depression.        Objective:     BP 132/60   Pulse 79   Temp 98.2 F (36.8 C) (Oral)   Ht 5' 3.5 (1.613 m)   Wt 144 lb 11.2 oz (65.6 kg)   SpO2 98%   BMI 25.23 kg/m    Physical Exam Vitals reviewed.  Constitutional:      Appearance: Normal appearance. She is well-groomed and normal weight.  HENT:     Right Ear: Tympanic membrane and ear canal normal.     Left Ear: Tympanic membrane and ear canal normal.     Mouth/Throat:     Mouth: Mucous membranes are moist.     Pharynx: No posterior oropharyngeal erythema.  Eyes:     Conjunctiva/sclera: Conjunctivae normal.  Neck:     Thyroid : No thyromegaly.  Cardiovascular:     Rate and Rhythm: Normal rate and regular rhythm.     Pulses: Normal pulses.     Heart sounds: S1 normal and S2 normal.  Pulmonary:     Effort: Pulmonary effort is normal.     Breath sounds: Normal breath sounds and air entry.  Abdominal:     General: Abdomen is flat. Bowel sounds are normal.     Palpations: Abdomen is soft.  Musculoskeletal:     Right lower leg: No edema.     Left lower leg: No edema.  Lymphadenopathy:     Cervical: No cervical adenopathy.  Neurological:     Mental Status: She is alert and oriented to person, place, and time. Mental status is at baseline.      Gait: Gait is intact.  Psychiatric:        Mood and Affect: Mood and affect normal.        Speech: Speech normal.        Behavior: Behavior normal.        Judgment: Judgment normal.      Results for orders placed or performed in visit on 03/17/24  POC HgB A1c  Result Value Ref Range   Hemoglobin A1C 6.2 (A) 4.0 - 5.6 %   HbA1c POC (<> result, manual entry)  HbA1c, POC (prediabetic range)     HbA1c, POC (controlled diabetic range)         Assessment & Plan:    Routine Health Maintenance and Physical Exam  Immunization History  Administered Date(s) Administered   Fluad Quad(high Dose 65+) 12/21/2019, 02/06/2021, 12/31/2021   Fluad Trivalent(High Dose 65+) 02/11/2023   INFLUENZA, HIGH DOSE SEASONAL PF 03/17/2024   Influenza Whole 01/12/1997, 01/05/2009   Influenza,inj,Quad PF,6+ Mos 01/12/2015, 12/27/2015, 03/09/2017, 12/01/2018   Influenza-Unspecified 02/12/2014, 01/19/2018   PFIZER(Purple Top)SARS-COV-2 Vaccination 07/07/2019, 08/01/2019, 02/06/2021   Pneumococcal Conjugate-13 12/01/2018   Pneumococcal Polysaccharide-23 04/24/2008, 01/31/2014, 12/21/2019   Td 11/13/1997, 04/27/2009   Tdap 05/20/2020   Zoster Recombinant(Shingrix) 08/22/2017, 11/12/2017, 01/12/2018    Health Maintenance  Topic Date Due   Diabetic kidney evaluation - Urine ACR  12/20/2020   Colonoscopy  04/30/2023   OPHTHALMOLOGY EXAM  06/19/2023   COVID-19 Vaccine (4 - 2025-26 season) 12/14/2023   Diabetic kidney evaluation - eGFR measurement  08/31/2024   HEMOGLOBIN A1C  09/15/2024   Medicare Annual Wellness (AWV)  02/16/2025   FOOT EXAM  03/17/2025   Mammogram  09/07/2025   DTaP/Tdap/Td (4 - Td or Tdap) 05/20/2030   Pneumococcal Vaccine: 50+ Years  Completed   Influenza Vaccine  Completed   Bone Density Scan  Completed   Hepatitis C Screening  Completed   Zoster Vaccines- Shingrix  Completed   Meningococcal B Vaccine  Aged Out    Discussed health benefits of physical activity, and  encouraged her to engage in regular exercise appropriate for her age and condition.  Type 2 diabetes mellitus with diabetic neuropathy, without long-term current use of insulin  (HCC) Assessment & Plan: Assessment and Plan    Type 2 diabetes mellitus with diabetic neuropathy Type 2 diabetes mellitus is well-controlled with an A1c of 6.2. Foot exam is normal with good sensation and pulses. Skin is dry.  - Continue current diabetes management regimen. - Performed foot exam today. - Ordered urine test to check for proteinuria. - Administered flu shot.  Folate deficiency Previously diagnosed with folate deficiency and was on folic acid  supplementation. Currently, folic acid  supplementation is not necessary as levels were previously corrected. Issues with insurance coverage for folic acid  due to lack of recent prescription justification. - Discontinued folic acid  supplementation. - Faxed letter to [employer] explaining discontinuation of folic acid .        Orders: -     POCT glycosylated hemoglobin (Hb A1C) -     Collection capillary blood specimen -     Microalbumin / creatinine urine ratio; Future -     Ambulatory referral to Ophthalmology  Folate deficiency -     Folate; Future  Routine adult health maintenance  Immunization due -     Flu vaccine HIGH DOSE PF(Fluzone Trivalent)  General physical exam findings are normal today. I reviewed the patient's preventative testing, immunizations, and lifestyle habits. I made appropriate recommendations and placed orders for the appropriate tests and/or vaccinations. I counseled the patient on the CDC's recommendations for healthy exercise and diet. I counseled the patient on healthy sleep habits and stress management. Handouts to reinforce the counseling were given at the conclusion of the visit.    Return in about 6 months (around 09/15/2024) for DM, HTN.     Heron CHRISTELLA Sharper, MD

## 2024-03-17 NOTE — Assessment & Plan Note (Signed)
 Assessment and Plan    Type 2 diabetes mellitus with diabetic neuropathy Type 2 diabetes mellitus is well-controlled with an A1c of 6.2. Foot exam is normal with good sensation and pulses. Skin is dry.  - Continue current diabetes management regimen. - Performed foot exam today. - Ordered urine test to check for proteinuria. - Administered flu shot.  Folate deficiency Previously diagnosed with folate deficiency and was on folic acid  supplementation. Currently, folic acid  supplementation is not necessary as levels were previously corrected. Issues with insurance coverage for folic acid  due to lack of recent prescription justification. - Discontinued folic acid  supplementation. - Faxed letter to [employer] explaining discontinuation of folic acid .

## 2024-03-17 NOTE — Patient Instructions (Signed)
 Magnesium glycinate or oxide-- 100 or 200 mg at bedtime for muscle cramps

## 2024-03-18 ENCOUNTER — Ambulatory Visit: Payer: Self-pay | Admitting: Family Medicine

## 2024-03-22 ENCOUNTER — Telehealth: Payer: Self-pay | Admitting: *Deleted

## 2024-03-22 NOTE — Telephone Encounter (Signed)
 Copied from CRM [id number]. Topic: Referral - Status >> Mar 22, 2024  8:55 AM Frederich PARAS wrote: Reason for CRM: alexdra from [facility] wanted to send a message to pcp to adv that no demographic page was attached to the referral request.she says they receive multiple with no demographic pages with the referrals ans I makes the process longer.callback# [phone]  fax # [phone]

## 2024-03-29 ENCOUNTER — Telehealth: Payer: Self-pay | Admitting: *Deleted

## 2024-03-29 NOTE — Telephone Encounter (Signed)
 Fax received via e-fax our office complete a form requesting accurate patient identification.  I called the patient to inform her of this as I do not recall seeing a form of this kind.  Patient was advised to contact [employer] at [phone].

## 2024-04-04 ENCOUNTER — Telehealth: Payer: Self-pay | Admitting: *Deleted

## 2024-04-04 NOTE — Telephone Encounter (Signed)
 Copied from CRM #[id number]. Topic: Clinical - Prescription Issue >> Apr 04, 2024  2:30 PM Viola F wrote: Reason for CRM: Patient called regarding the Ozympic medication - [facility] sent fax and needs it sent back in order to authorize the medication. She says Idell A should have the fax. Please call her with an update at [phone]

## 2024-04-04 NOTE — Telephone Encounter (Signed)
 Form completed and faxed to [employer] at [phone].

## 2024-04-05 ENCOUNTER — Telehealth: Payer: Self-pay | Admitting: *Deleted

## 2024-04-05 NOTE — Telephone Encounter (Signed)
 Done-see prior phone note.

## 2024-04-05 NOTE — Telephone Encounter (Signed)
 Copied from CRM #[id number]. Topic: General - Call Back - No Documentation >> Mar 29, 2024  3:33 PM Alfonso ORN wrote: Reason for CRM: pt called back to f/u regarding [employer] request . Pt stated that she spoke to [facility] and that it was ok to complete form for [employer]

## 2024-09-15 ENCOUNTER — Ambulatory Visit: Admitting: Family Medicine
